# Patient Record
Sex: Female | Born: 1935 | Race: White | Hispanic: No | Smoking: Former smoker
Health system: Southern US, Community
[De-identification: ages and names within clinical notes are randomized; demographics above are authoritative.]

## PROBLEM LIST (undated history)

## (undated) ENCOUNTER — Ambulatory Visit: Admission: EM | Payer: Medicare Other | Source: Home / Self Care

## (undated) DIAGNOSIS — C50919 Malignant neoplasm of unspecified site of unspecified female breast: Secondary | ICD-10-CM

## (undated) DIAGNOSIS — F0390 Unspecified dementia without behavioral disturbance: Secondary | ICD-10-CM

## (undated) DIAGNOSIS — E079 Disorder of thyroid, unspecified: Secondary | ICD-10-CM

## (undated) DIAGNOSIS — N289 Disorder of kidney and ureter, unspecified: Secondary | ICD-10-CM

## (undated) DIAGNOSIS — E119 Type 2 diabetes mellitus without complications: Secondary | ICD-10-CM

## (undated) HISTORY — PX: BREAST SURGERY: SHX581

## (undated) HISTORY — DX: Malignant neoplasm of unspecified site of unspecified female breast: C50.919

## (undated) HISTORY — PX: BOWEL RESECTION: SHX1257

## (undated) HISTORY — PX: SKIN BIOPSY: SHX1

## (undated) HISTORY — PX: OTHER SURGICAL HISTORY: SHX169

---

## 2004-05-24 ENCOUNTER — Ambulatory Visit: Payer: Self-pay | Admitting: Oncology

## 2004-10-05 ENCOUNTER — Ambulatory Visit: Payer: Self-pay | Admitting: Neurology

## 2004-11-22 ENCOUNTER — Other Ambulatory Visit: Payer: Self-pay

## 2004-11-24 ENCOUNTER — Inpatient Hospital Stay: Payer: Self-pay | Admitting: Internal Medicine

## 2004-11-26 ENCOUNTER — Ambulatory Visit: Payer: Self-pay | Admitting: Internal Medicine

## 2005-01-23 ENCOUNTER — Ambulatory Visit: Payer: Self-pay | Admitting: Oncology

## 2005-03-03 ENCOUNTER — Ambulatory Visit: Payer: Self-pay | Admitting: Unknown Physician Specialty

## 2005-03-29 ENCOUNTER — Inpatient Hospital Stay: Payer: Self-pay | Admitting: Unknown Physician Specialty

## 2005-03-29 ENCOUNTER — Other Ambulatory Visit: Payer: Self-pay

## 2005-04-20 ENCOUNTER — Inpatient Hospital Stay: Payer: Self-pay | Admitting: Internal Medicine

## 2005-05-16 ENCOUNTER — Inpatient Hospital Stay: Payer: Self-pay | Admitting: Internal Medicine

## 2005-05-29 ENCOUNTER — Ambulatory Visit: Payer: Self-pay | Admitting: Oncology

## 2005-06-02 ENCOUNTER — Ambulatory Visit: Payer: Self-pay | Admitting: Oncology

## 2005-06-28 ENCOUNTER — Inpatient Hospital Stay: Payer: Self-pay | Admitting: Unknown Physician Specialty

## 2005-06-28 ENCOUNTER — Other Ambulatory Visit: Payer: Self-pay

## 2005-07-19 ENCOUNTER — Ambulatory Visit: Payer: Self-pay | Admitting: Family Medicine

## 2005-07-21 ENCOUNTER — Emergency Department: Payer: Self-pay | Admitting: Emergency Medicine

## 2005-07-23 ENCOUNTER — Other Ambulatory Visit: Payer: Self-pay

## 2005-07-23 ENCOUNTER — Emergency Department: Payer: Self-pay | Admitting: Emergency Medicine

## 2005-08-31 ENCOUNTER — Ambulatory Visit: Payer: Self-pay | Admitting: Obstetrics and Gynecology

## 2005-08-31 ENCOUNTER — Other Ambulatory Visit: Payer: Self-pay

## 2005-09-08 ENCOUNTER — Ambulatory Visit: Payer: Self-pay | Admitting: Obstetrics and Gynecology

## 2005-11-27 ENCOUNTER — Ambulatory Visit: Payer: Self-pay | Admitting: Oncology

## 2005-12-01 ENCOUNTER — Ambulatory Visit: Payer: Self-pay | Admitting: Oncology

## 2005-12-31 ENCOUNTER — Ambulatory Visit: Payer: Self-pay | Admitting: Oncology

## 2006-01-02 ENCOUNTER — Ambulatory Visit: Payer: Self-pay | Admitting: Family Medicine

## 2006-01-12 ENCOUNTER — Ambulatory Visit: Payer: Self-pay | Admitting: Internal Medicine

## 2006-02-06 ENCOUNTER — Ambulatory Visit: Payer: Self-pay | Admitting: Oncology

## 2006-02-08 ENCOUNTER — Ambulatory Visit: Payer: Self-pay | Admitting: Oncology

## 2006-03-20 ENCOUNTER — Inpatient Hospital Stay: Payer: Self-pay | Admitting: Internal Medicine

## 2006-03-20 ENCOUNTER — Other Ambulatory Visit: Payer: Self-pay

## 2006-05-09 ENCOUNTER — Ambulatory Visit: Payer: Self-pay | Admitting: Oncology

## 2006-06-02 ENCOUNTER — Ambulatory Visit: Payer: Self-pay | Admitting: Oncology

## 2006-09-03 ENCOUNTER — Other Ambulatory Visit: Payer: Self-pay

## 2006-09-03 ENCOUNTER — Inpatient Hospital Stay: Payer: Self-pay | Admitting: Internal Medicine

## 2006-09-04 ENCOUNTER — Other Ambulatory Visit: Payer: Self-pay

## 2006-09-05 ENCOUNTER — Other Ambulatory Visit: Payer: Self-pay

## 2006-09-10 ENCOUNTER — Ambulatory Visit: Payer: Self-pay | Admitting: Oncology

## 2006-10-02 ENCOUNTER — Ambulatory Visit: Payer: Self-pay | Admitting: Oncology

## 2007-01-07 ENCOUNTER — Ambulatory Visit: Payer: Self-pay | Admitting: Oncology

## 2007-01-14 ENCOUNTER — Ambulatory Visit: Payer: Self-pay | Admitting: Oncology

## 2007-01-15 ENCOUNTER — Ambulatory Visit: Payer: Self-pay | Admitting: Oncology

## 2007-02-01 ENCOUNTER — Ambulatory Visit: Payer: Self-pay | Admitting: Oncology

## 2007-06-24 ENCOUNTER — Ambulatory Visit: Payer: Self-pay | Admitting: Unknown Physician Specialty

## 2007-07-04 ENCOUNTER — Ambulatory Visit: Payer: Self-pay | Admitting: Oncology

## 2007-07-12 ENCOUNTER — Ambulatory Visit: Payer: Self-pay | Admitting: Oncology

## 2007-08-04 ENCOUNTER — Ambulatory Visit: Payer: Self-pay | Admitting: Oncology

## 2007-08-07 ENCOUNTER — Ambulatory Visit: Payer: Self-pay | Admitting: Internal Medicine

## 2007-09-04 ENCOUNTER — Ambulatory Visit: Payer: Self-pay | Admitting: Family Medicine

## 2007-12-02 ENCOUNTER — Ambulatory Visit: Payer: Self-pay | Admitting: Oncology

## 2007-12-12 ENCOUNTER — Ambulatory Visit: Payer: Self-pay | Admitting: Oncology

## 2007-12-24 ENCOUNTER — Ambulatory Visit: Payer: Self-pay | Admitting: Oncology

## 2007-12-26 ENCOUNTER — Ambulatory Visit: Payer: Self-pay | Admitting: Surgery

## 2008-01-01 ENCOUNTER — Ambulatory Visit: Payer: Self-pay | Admitting: Oncology

## 2008-01-02 ENCOUNTER — Other Ambulatory Visit: Payer: Self-pay

## 2008-01-02 ENCOUNTER — Ambulatory Visit: Payer: Self-pay | Admitting: Surgery

## 2008-01-07 ENCOUNTER — Ambulatory Visit: Payer: Self-pay | Admitting: Oncology

## 2008-01-09 ENCOUNTER — Ambulatory Visit: Payer: Self-pay | Admitting: Surgery

## 2008-02-01 ENCOUNTER — Ambulatory Visit: Payer: Self-pay | Admitting: Oncology

## 2008-03-03 ENCOUNTER — Ambulatory Visit: Payer: Self-pay | Admitting: Oncology

## 2008-04-02 ENCOUNTER — Ambulatory Visit: Payer: Self-pay | Admitting: Oncology

## 2008-05-03 ENCOUNTER — Ambulatory Visit: Payer: Self-pay | Admitting: Oncology

## 2008-06-02 ENCOUNTER — Ambulatory Visit: Payer: Self-pay | Admitting: Oncology

## 2008-06-10 ENCOUNTER — Ambulatory Visit: Payer: Self-pay | Admitting: Internal Medicine

## 2008-07-03 ENCOUNTER — Ambulatory Visit: Payer: Self-pay | Admitting: Oncology

## 2008-08-03 ENCOUNTER — Ambulatory Visit: Payer: Self-pay | Admitting: Oncology

## 2008-08-31 ENCOUNTER — Ambulatory Visit: Payer: Self-pay | Admitting: Oncology

## 2008-10-01 ENCOUNTER — Ambulatory Visit: Payer: Self-pay | Admitting: Oncology

## 2008-10-06 ENCOUNTER — Ambulatory Visit: Payer: Self-pay | Admitting: Oncology

## 2008-10-31 ENCOUNTER — Ambulatory Visit: Payer: Self-pay | Admitting: Oncology

## 2008-12-01 ENCOUNTER — Ambulatory Visit: Payer: Self-pay | Admitting: Oncology

## 2008-12-08 ENCOUNTER — Ambulatory Visit: Payer: Self-pay | Admitting: Oncology

## 2008-12-25 ENCOUNTER — Observation Stay: Payer: Self-pay | Admitting: Specialist

## 2008-12-31 ENCOUNTER — Ambulatory Visit: Payer: Self-pay | Admitting: Oncology

## 2009-01-05 ENCOUNTER — Ambulatory Visit: Payer: Self-pay | Admitting: Oncology

## 2009-01-31 ENCOUNTER — Ambulatory Visit: Payer: Self-pay | Admitting: Oncology

## 2009-02-01 ENCOUNTER — Inpatient Hospital Stay: Payer: Self-pay | Admitting: Internal Medicine

## 2009-02-09 ENCOUNTER — Ambulatory Visit: Payer: Self-pay | Admitting: Oncology

## 2009-02-15 ENCOUNTER — Encounter: Payer: Self-pay | Admitting: Family Medicine

## 2009-03-03 ENCOUNTER — Ambulatory Visit: Payer: Self-pay | Admitting: Oncology

## 2009-03-03 ENCOUNTER — Encounter: Payer: Self-pay | Admitting: Family Medicine

## 2009-04-02 ENCOUNTER — Ambulatory Visit: Payer: Self-pay | Admitting: Oncology

## 2009-05-03 ENCOUNTER — Ambulatory Visit: Payer: Self-pay | Admitting: Oncology

## 2009-05-11 ENCOUNTER — Ambulatory Visit: Payer: Self-pay | Admitting: Oncology

## 2009-06-02 ENCOUNTER — Ambulatory Visit: Payer: Self-pay | Admitting: Oncology

## 2009-07-03 ENCOUNTER — Ambulatory Visit: Payer: Self-pay | Admitting: Oncology

## 2009-07-08 ENCOUNTER — Ambulatory Visit: Payer: Self-pay | Admitting: Nephrology

## 2009-07-09 ENCOUNTER — Ambulatory Visit: Payer: Self-pay | Admitting: Oncology

## 2009-08-03 ENCOUNTER — Ambulatory Visit: Payer: Self-pay | Admitting: Oncology

## 2009-08-31 ENCOUNTER — Ambulatory Visit: Payer: Self-pay | Admitting: Oncology

## 2009-09-21 ENCOUNTER — Ambulatory Visit: Payer: Self-pay | Admitting: Oncology

## 2009-10-01 ENCOUNTER — Ambulatory Visit: Payer: Self-pay | Admitting: Oncology

## 2009-10-31 ENCOUNTER — Ambulatory Visit: Payer: Self-pay | Admitting: Oncology

## 2009-12-01 ENCOUNTER — Ambulatory Visit: Payer: Self-pay | Admitting: Oncology

## 2009-12-11 ENCOUNTER — Inpatient Hospital Stay: Payer: Self-pay | Admitting: *Deleted

## 2009-12-21 ENCOUNTER — Ambulatory Visit: Payer: Self-pay | Admitting: Oncology

## 2009-12-26 ENCOUNTER — Inpatient Hospital Stay: Payer: Self-pay | Admitting: Internal Medicine

## 2009-12-31 ENCOUNTER — Ambulatory Visit: Payer: Self-pay | Admitting: Oncology

## 2010-01-31 ENCOUNTER — Ambulatory Visit: Payer: Self-pay | Admitting: Oncology

## 2010-03-03 ENCOUNTER — Ambulatory Visit: Payer: Self-pay | Admitting: Oncology

## 2010-04-02 ENCOUNTER — Ambulatory Visit: Payer: Self-pay | Admitting: Oncology

## 2010-05-03 ENCOUNTER — Ambulatory Visit: Payer: Self-pay | Admitting: Oncology

## 2010-06-02 ENCOUNTER — Ambulatory Visit: Payer: Self-pay | Admitting: Oncology

## 2010-07-03 ENCOUNTER — Ambulatory Visit: Payer: Self-pay | Admitting: Oncology

## 2010-08-03 ENCOUNTER — Ambulatory Visit: Payer: Self-pay | Admitting: Oncology

## 2010-09-01 ENCOUNTER — Ambulatory Visit: Payer: Self-pay | Admitting: Oncology

## 2010-09-22 LAB — CANCER ANTIGEN 27.29: CA 27.29: 34.2 U/mL (ref 0.0–38.6)

## 2010-10-02 ENCOUNTER — Ambulatory Visit: Payer: Self-pay | Admitting: Oncology

## 2010-11-01 ENCOUNTER — Ambulatory Visit: Payer: Self-pay | Admitting: Oncology

## 2010-11-15 ENCOUNTER — Observation Stay: Payer: Self-pay | Admitting: Internal Medicine

## 2010-11-23 ENCOUNTER — Ambulatory Visit: Payer: Self-pay | Admitting: Oncology

## 2010-11-24 LAB — CANCER ANTIGEN 27.29: CA 27.29: 38.6 U/mL (ref 0.0–38.6)

## 2010-12-02 ENCOUNTER — Ambulatory Visit: Payer: Self-pay | Admitting: Oncology

## 2011-01-01 ENCOUNTER — Ambulatory Visit: Payer: Self-pay | Admitting: Oncology

## 2011-02-01 ENCOUNTER — Ambulatory Visit: Payer: Self-pay | Admitting: Oncology

## 2011-03-04 ENCOUNTER — Ambulatory Visit: Payer: Self-pay | Admitting: Oncology

## 2011-04-05 ENCOUNTER — Ambulatory Visit: Payer: Self-pay | Admitting: Oncology

## 2011-04-06 LAB — CANCER ANTIGEN 27.29: CA 27.29: 26.1 U/mL (ref 0.0–38.6)

## 2011-05-04 ENCOUNTER — Ambulatory Visit: Payer: Self-pay | Admitting: Oncology

## 2011-06-03 ENCOUNTER — Ambulatory Visit: Payer: Self-pay | Admitting: Oncology

## 2011-07-04 ENCOUNTER — Ambulatory Visit: Payer: Self-pay | Admitting: Oncology

## 2011-07-04 DIAGNOSIS — Z9010 Acquired absence of unspecified breast and nipple: Secondary | ICD-10-CM | POA: Diagnosis not present

## 2011-07-04 DIAGNOSIS — N189 Chronic kidney disease, unspecified: Secondary | ICD-10-CM | POA: Diagnosis not present

## 2011-07-04 DIAGNOSIS — M899 Disorder of bone, unspecified: Secondary | ICD-10-CM | POA: Diagnosis not present

## 2011-07-04 DIAGNOSIS — I129 Hypertensive chronic kidney disease with stage 1 through stage 4 chronic kidney disease, or unspecified chronic kidney disease: Secondary | ICD-10-CM | POA: Diagnosis not present

## 2011-07-04 DIAGNOSIS — Z95818 Presence of other cardiac implants and grafts: Secondary | ICD-10-CM | POA: Diagnosis not present

## 2011-07-04 DIAGNOSIS — I2510 Atherosclerotic heart disease of native coronary artery without angina pectoris: Secondary | ICD-10-CM | POA: Diagnosis not present

## 2011-07-04 DIAGNOSIS — Z5181 Encounter for therapeutic drug level monitoring: Secondary | ICD-10-CM | POA: Diagnosis not present

## 2011-07-04 DIAGNOSIS — M129 Arthropathy, unspecified: Secondary | ICD-10-CM | POA: Diagnosis not present

## 2011-07-04 DIAGNOSIS — Z7982 Long term (current) use of aspirin: Secondary | ICD-10-CM | POA: Diagnosis not present

## 2011-07-04 DIAGNOSIS — D638 Anemia in other chronic diseases classified elsewhere: Secondary | ICD-10-CM | POA: Diagnosis not present

## 2011-07-04 DIAGNOSIS — Z79899 Other long term (current) drug therapy: Secondary | ICD-10-CM | POA: Diagnosis not present

## 2011-07-04 DIAGNOSIS — E119 Type 2 diabetes mellitus without complications: Secondary | ICD-10-CM | POA: Diagnosis not present

## 2011-07-04 DIAGNOSIS — E039 Hypothyroidism, unspecified: Secondary | ICD-10-CM | POA: Diagnosis not present

## 2011-07-04 DIAGNOSIS — C50919 Malignant neoplasm of unspecified site of unspecified female breast: Secondary | ICD-10-CM | POA: Diagnosis not present

## 2011-07-04 DIAGNOSIS — Z7902 Long term (current) use of antithrombotics/antiplatelets: Secondary | ICD-10-CM | POA: Diagnosis not present

## 2011-07-04 DIAGNOSIS — Z79811 Long term (current) use of aromatase inhibitors: Secondary | ICD-10-CM | POA: Diagnosis not present

## 2011-07-04 DIAGNOSIS — Z170 Estrogen receptor positive status [ER+]: Secondary | ICD-10-CM | POA: Diagnosis not present

## 2011-07-18 DIAGNOSIS — J40 Bronchitis, not specified as acute or chronic: Secondary | ICD-10-CM | POA: Diagnosis not present

## 2011-07-24 LAB — CANCER CENTER HEMOGLOBIN: HGB: 10.1 g/dL — ABNORMAL LOW (ref 12.0–16.0)

## 2011-08-04 ENCOUNTER — Ambulatory Visit: Payer: Self-pay | Admitting: Oncology

## 2011-08-04 DIAGNOSIS — C50919 Malignant neoplasm of unspecified site of unspecified female breast: Secondary | ICD-10-CM | POA: Diagnosis not present

## 2011-08-04 DIAGNOSIS — Z79899 Other long term (current) drug therapy: Secondary | ICD-10-CM | POA: Diagnosis not present

## 2011-08-04 DIAGNOSIS — D638 Anemia in other chronic diseases classified elsewhere: Secondary | ICD-10-CM | POA: Diagnosis not present

## 2011-08-04 DIAGNOSIS — Z7902 Long term (current) use of antithrombotics/antiplatelets: Secondary | ICD-10-CM | POA: Diagnosis not present

## 2011-08-04 DIAGNOSIS — I2510 Atherosclerotic heart disease of native coronary artery without angina pectoris: Secondary | ICD-10-CM | POA: Diagnosis not present

## 2011-08-04 DIAGNOSIS — I129 Hypertensive chronic kidney disease with stage 1 through stage 4 chronic kidney disease, or unspecified chronic kidney disease: Secondary | ICD-10-CM | POA: Diagnosis not present

## 2011-08-04 DIAGNOSIS — M949 Disorder of cartilage, unspecified: Secondary | ICD-10-CM | POA: Diagnosis not present

## 2011-08-04 DIAGNOSIS — Z79811 Long term (current) use of aromatase inhibitors: Secondary | ICD-10-CM | POA: Diagnosis not present

## 2011-08-04 DIAGNOSIS — Z5181 Encounter for therapeutic drug level monitoring: Secondary | ICD-10-CM | POA: Diagnosis not present

## 2011-08-04 DIAGNOSIS — E119 Type 2 diabetes mellitus without complications: Secondary | ICD-10-CM | POA: Diagnosis not present

## 2011-08-04 DIAGNOSIS — Z170 Estrogen receptor positive status [ER+]: Secondary | ICD-10-CM | POA: Diagnosis not present

## 2011-08-04 DIAGNOSIS — E039 Hypothyroidism, unspecified: Secondary | ICD-10-CM | POA: Diagnosis not present

## 2011-08-04 DIAGNOSIS — N189 Chronic kidney disease, unspecified: Secondary | ICD-10-CM | POA: Diagnosis not present

## 2011-08-04 DIAGNOSIS — Z9010 Acquired absence of unspecified breast and nipple: Secondary | ICD-10-CM | POA: Diagnosis not present

## 2011-08-04 DIAGNOSIS — Z7982 Long term (current) use of aspirin: Secondary | ICD-10-CM | POA: Diagnosis not present

## 2011-08-04 DIAGNOSIS — Z95818 Presence of other cardiac implants and grafts: Secondary | ICD-10-CM | POA: Diagnosis not present

## 2011-08-04 DIAGNOSIS — M899 Disorder of bone, unspecified: Secondary | ICD-10-CM | POA: Diagnosis not present

## 2011-08-04 DIAGNOSIS — M129 Arthropathy, unspecified: Secondary | ICD-10-CM | POA: Diagnosis not present

## 2011-08-04 LAB — CANCER CENTER HEMOGLOBIN: HGB: 9.7 g/dL — ABNORMAL LOW (ref 12.0–16.0)

## 2011-08-18 DIAGNOSIS — Z79811 Long term (current) use of aromatase inhibitors: Secondary | ICD-10-CM | POA: Diagnosis not present

## 2011-08-18 DIAGNOSIS — M949 Disorder of cartilage, unspecified: Secondary | ICD-10-CM | POA: Diagnosis not present

## 2011-08-18 DIAGNOSIS — C50919 Malignant neoplasm of unspecified site of unspecified female breast: Secondary | ICD-10-CM | POA: Diagnosis not present

## 2011-08-18 DIAGNOSIS — Z170 Estrogen receptor positive status [ER+]: Secondary | ICD-10-CM | POA: Diagnosis not present

## 2011-08-18 DIAGNOSIS — N189 Chronic kidney disease, unspecified: Secondary | ICD-10-CM | POA: Diagnosis not present

## 2011-08-18 DIAGNOSIS — D638 Anemia in other chronic diseases classified elsewhere: Secondary | ICD-10-CM | POA: Diagnosis not present

## 2011-08-18 LAB — CANCER CENTER HEMOGLOBIN: HGB: 9.9 g/dL — ABNORMAL LOW (ref 12.0–16.0)

## 2011-08-27 ENCOUNTER — Emergency Department: Payer: Self-pay | Admitting: Emergency Medicine

## 2011-08-27 DIAGNOSIS — N189 Chronic kidney disease, unspecified: Secondary | ICD-10-CM | POA: Diagnosis not present

## 2011-08-27 DIAGNOSIS — Z86718 Personal history of other venous thrombosis and embolism: Secondary | ICD-10-CM | POA: Diagnosis not present

## 2011-08-27 DIAGNOSIS — Z0389 Encounter for observation for other suspected diseases and conditions ruled out: Secondary | ICD-10-CM | POA: Diagnosis not present

## 2011-08-27 DIAGNOSIS — M7120 Synovial cyst of popliteal space [Baker], unspecified knee: Secondary | ICD-10-CM | POA: Diagnosis not present

## 2011-09-01 ENCOUNTER — Ambulatory Visit: Payer: Self-pay | Admitting: Oncology

## 2011-09-01 DIAGNOSIS — N189 Chronic kidney disease, unspecified: Secondary | ICD-10-CM | POA: Diagnosis not present

## 2011-09-01 DIAGNOSIS — Z79899 Other long term (current) drug therapy: Secondary | ICD-10-CM | POA: Diagnosis not present

## 2011-09-01 DIAGNOSIS — Z7902 Long term (current) use of antithrombotics/antiplatelets: Secondary | ICD-10-CM | POA: Diagnosis not present

## 2011-09-01 DIAGNOSIS — Z79811 Long term (current) use of aromatase inhibitors: Secondary | ICD-10-CM | POA: Diagnosis not present

## 2011-09-01 DIAGNOSIS — C50919 Malignant neoplasm of unspecified site of unspecified female breast: Secondary | ICD-10-CM | POA: Diagnosis not present

## 2011-09-01 DIAGNOSIS — D638 Anemia in other chronic diseases classified elsewhere: Secondary | ICD-10-CM | POA: Diagnosis not present

## 2011-09-01 DIAGNOSIS — I2510 Atherosclerotic heart disease of native coronary artery without angina pectoris: Secondary | ICD-10-CM | POA: Diagnosis not present

## 2011-09-01 DIAGNOSIS — M129 Arthropathy, unspecified: Secondary | ICD-10-CM | POA: Diagnosis not present

## 2011-09-01 DIAGNOSIS — Z5181 Encounter for therapeutic drug level monitoring: Secondary | ICD-10-CM | POA: Diagnosis not present

## 2011-09-01 DIAGNOSIS — E039 Hypothyroidism, unspecified: Secondary | ICD-10-CM | POA: Diagnosis not present

## 2011-09-01 DIAGNOSIS — Z9010 Acquired absence of unspecified breast and nipple: Secondary | ICD-10-CM | POA: Diagnosis not present

## 2011-09-01 DIAGNOSIS — Z95818 Presence of other cardiac implants and grafts: Secondary | ICD-10-CM | POA: Diagnosis not present

## 2011-09-01 DIAGNOSIS — Z7982 Long term (current) use of aspirin: Secondary | ICD-10-CM | POA: Diagnosis not present

## 2011-09-01 DIAGNOSIS — I129 Hypertensive chronic kidney disease with stage 1 through stage 4 chronic kidney disease, or unspecified chronic kidney disease: Secondary | ICD-10-CM | POA: Diagnosis not present

## 2011-09-01 DIAGNOSIS — Z170 Estrogen receptor positive status [ER+]: Secondary | ICD-10-CM | POA: Diagnosis not present

## 2011-09-01 DIAGNOSIS — M899 Disorder of bone, unspecified: Secondary | ICD-10-CM | POA: Diagnosis not present

## 2011-09-01 DIAGNOSIS — E119 Type 2 diabetes mellitus without complications: Secondary | ICD-10-CM | POA: Diagnosis not present

## 2011-09-08 DIAGNOSIS — D638 Anemia in other chronic diseases classified elsewhere: Secondary | ICD-10-CM | POA: Diagnosis not present

## 2011-09-08 DIAGNOSIS — C50919 Malignant neoplasm of unspecified site of unspecified female breast: Secondary | ICD-10-CM | POA: Diagnosis not present

## 2011-09-08 DIAGNOSIS — Z79811 Long term (current) use of aromatase inhibitors: Secondary | ICD-10-CM | POA: Diagnosis not present

## 2011-09-08 DIAGNOSIS — N189 Chronic kidney disease, unspecified: Secondary | ICD-10-CM | POA: Diagnosis not present

## 2011-09-08 LAB — CBC CANCER CENTER
Basophil #: 0.1 x10 3/mm (ref 0.0–0.1)
Basophil %: 0.8 %
Eosinophil #: 0.1 x10 3/mm (ref 0.0–0.7)
Eosinophil %: 1.8 %
HCT: 33.7 % — ABNORMAL LOW (ref 35.0–47.0)
HGB: 10.8 g/dL — ABNORMAL LOW (ref 12.0–16.0)
Lymphocyte #: 2 x10 3/mm (ref 1.0–3.6)
Lymphocyte %: 30.1 %
MCHC: 32.1 g/dL (ref 32.0–36.0)
MCV: 92.8 fL (ref 80–100)
Monocyte #: 0.5 x10 3/mm (ref 0.0–0.7)
Monocyte %: 8.1 %
Neutrophil #: 4 x10 3/mm (ref 1.4–6.5)

## 2011-09-08 LAB — COMPREHENSIVE METABOLIC PANEL
Anion Gap: 5 — ABNORMAL LOW (ref 7–16)
Calcium, Total: 9.6 mg/dL (ref 8.5–10.1)
Co2: 31 mmol/L (ref 21–32)
Creatinine: 2.12 mg/dL — ABNORMAL HIGH (ref 0.60–1.30)
EGFR (Non-African Amer.): 23 — ABNORMAL LOW
Glucose: 96 mg/dL (ref 65–99)
Osmolality: 284 (ref 275–301)
Potassium: 4.5 mmol/L (ref 3.5–5.1)
SGOT(AST): 22 U/L (ref 15–37)
SGPT (ALT): 20 U/L
Sodium: 139 mmol/L (ref 136–145)
Total Protein: 7.2 g/dL (ref 6.4–8.2)

## 2011-09-11 DIAGNOSIS — I2510 Atherosclerotic heart disease of native coronary artery without angina pectoris: Secondary | ICD-10-CM | POA: Diagnosis not present

## 2011-09-11 DIAGNOSIS — E782 Mixed hyperlipidemia: Secondary | ICD-10-CM | POA: Diagnosis not present

## 2011-09-11 DIAGNOSIS — I119 Hypertensive heart disease without heart failure: Secondary | ICD-10-CM | POA: Diagnosis not present

## 2011-09-11 DIAGNOSIS — I209 Angina pectoris, unspecified: Secondary | ICD-10-CM | POA: Diagnosis not present

## 2011-09-14 DIAGNOSIS — N184 Chronic kidney disease, stage 4 (severe): Secondary | ICD-10-CM | POA: Diagnosis not present

## 2011-09-14 DIAGNOSIS — D631 Anemia in chronic kidney disease: Secondary | ICD-10-CM | POA: Diagnosis not present

## 2011-09-14 DIAGNOSIS — I701 Atherosclerosis of renal artery: Secondary | ICD-10-CM | POA: Diagnosis not present

## 2011-09-14 DIAGNOSIS — I10 Essential (primary) hypertension: Secondary | ICD-10-CM | POA: Diagnosis not present

## 2011-09-15 DIAGNOSIS — I209 Angina pectoris, unspecified: Secondary | ICD-10-CM | POA: Diagnosis not present

## 2011-09-18 DIAGNOSIS — J019 Acute sinusitis, unspecified: Secondary | ICD-10-CM | POA: Diagnosis not present

## 2011-09-20 DIAGNOSIS — I209 Angina pectoris, unspecified: Secondary | ICD-10-CM | POA: Diagnosis not present

## 2011-09-20 DIAGNOSIS — I252 Old myocardial infarction: Secondary | ICD-10-CM | POA: Diagnosis not present

## 2011-09-20 DIAGNOSIS — I119 Hypertensive heart disease without heart failure: Secondary | ICD-10-CM | POA: Diagnosis not present

## 2011-09-20 DIAGNOSIS — I2510 Atherosclerotic heart disease of native coronary artery without angina pectoris: Secondary | ICD-10-CM | POA: Diagnosis not present

## 2011-09-22 LAB — IRON AND TIBC
Iron Bind.Cap.(Total): 312 ug/dL (ref 250–450)
Iron Saturation: 15 %

## 2011-09-28 DIAGNOSIS — Z961 Presence of intraocular lens: Secondary | ICD-10-CM | POA: Diagnosis not present

## 2011-10-02 ENCOUNTER — Ambulatory Visit: Payer: Self-pay | Admitting: Oncology

## 2011-10-02 DIAGNOSIS — Z7902 Long term (current) use of antithrombotics/antiplatelets: Secondary | ICD-10-CM | POA: Diagnosis not present

## 2011-10-02 DIAGNOSIS — Z7982 Long term (current) use of aspirin: Secondary | ICD-10-CM | POA: Diagnosis not present

## 2011-10-02 DIAGNOSIS — M899 Disorder of bone, unspecified: Secondary | ICD-10-CM | POA: Diagnosis not present

## 2011-10-02 DIAGNOSIS — Z79899 Other long term (current) drug therapy: Secondary | ICD-10-CM | POA: Diagnosis not present

## 2011-10-02 DIAGNOSIS — Z5181 Encounter for therapeutic drug level monitoring: Secondary | ICD-10-CM | POA: Diagnosis not present

## 2011-10-02 DIAGNOSIS — Z95818 Presence of other cardiac implants and grafts: Secondary | ICD-10-CM | POA: Diagnosis not present

## 2011-10-02 DIAGNOSIS — C50919 Malignant neoplasm of unspecified site of unspecified female breast: Secondary | ICD-10-CM | POA: Diagnosis not present

## 2011-10-02 DIAGNOSIS — I2510 Atherosclerotic heart disease of native coronary artery without angina pectoris: Secondary | ICD-10-CM | POA: Diagnosis not present

## 2011-10-02 DIAGNOSIS — Z79811 Long term (current) use of aromatase inhibitors: Secondary | ICD-10-CM | POA: Diagnosis not present

## 2011-10-02 DIAGNOSIS — E039 Hypothyroidism, unspecified: Secondary | ICD-10-CM | POA: Diagnosis not present

## 2011-10-02 DIAGNOSIS — D638 Anemia in other chronic diseases classified elsewhere: Secondary | ICD-10-CM | POA: Diagnosis not present

## 2011-10-02 DIAGNOSIS — I129 Hypertensive chronic kidney disease with stage 1 through stage 4 chronic kidney disease, or unspecified chronic kidney disease: Secondary | ICD-10-CM | POA: Diagnosis not present

## 2011-10-02 DIAGNOSIS — M129 Arthropathy, unspecified: Secondary | ICD-10-CM | POA: Diagnosis not present

## 2011-10-02 DIAGNOSIS — Z170 Estrogen receptor positive status [ER+]: Secondary | ICD-10-CM | POA: Diagnosis not present

## 2011-10-02 DIAGNOSIS — N189 Chronic kidney disease, unspecified: Secondary | ICD-10-CM | POA: Diagnosis not present

## 2011-10-02 DIAGNOSIS — Z9010 Acquired absence of unspecified breast and nipple: Secondary | ICD-10-CM | POA: Diagnosis not present

## 2011-10-02 DIAGNOSIS — E119 Type 2 diabetes mellitus without complications: Secondary | ICD-10-CM | POA: Diagnosis not present

## 2011-10-18 DIAGNOSIS — E785 Hyperlipidemia, unspecified: Secondary | ICD-10-CM | POA: Diagnosis not present

## 2011-10-18 DIAGNOSIS — I10 Essential (primary) hypertension: Secondary | ICD-10-CM | POA: Diagnosis not present

## 2011-10-18 DIAGNOSIS — I2510 Atherosclerotic heart disease of native coronary artery without angina pectoris: Secondary | ICD-10-CM | POA: Diagnosis not present

## 2011-10-20 LAB — CANCER CENTER HEMOGLOBIN: HGB: 11.5 g/dL — ABNORMAL LOW (ref 12.0–16.0)

## 2011-10-30 DIAGNOSIS — M6740 Ganglion, unspecified site: Secondary | ICD-10-CM | POA: Diagnosis not present

## 2011-10-30 DIAGNOSIS — M1710 Unilateral primary osteoarthritis, unspecified knee: Secondary | ICD-10-CM | POA: Diagnosis not present

## 2011-11-01 ENCOUNTER — Ambulatory Visit: Payer: Self-pay | Admitting: Oncology

## 2011-11-01 DIAGNOSIS — Z170 Estrogen receptor positive status [ER+]: Secondary | ICD-10-CM | POA: Diagnosis not present

## 2011-11-01 DIAGNOSIS — D638 Anemia in other chronic diseases classified elsewhere: Secondary | ICD-10-CM | POA: Diagnosis not present

## 2011-11-01 DIAGNOSIS — Z79899 Other long term (current) drug therapy: Secondary | ICD-10-CM | POA: Diagnosis not present

## 2011-11-01 DIAGNOSIS — I129 Hypertensive chronic kidney disease with stage 1 through stage 4 chronic kidney disease, or unspecified chronic kidney disease: Secondary | ICD-10-CM | POA: Diagnosis not present

## 2011-11-01 DIAGNOSIS — I2510 Atherosclerotic heart disease of native coronary artery without angina pectoris: Secondary | ICD-10-CM | POA: Diagnosis not present

## 2011-11-01 DIAGNOSIS — Z5181 Encounter for therapeutic drug level monitoring: Secondary | ICD-10-CM | POA: Diagnosis not present

## 2011-11-01 DIAGNOSIS — N189 Chronic kidney disease, unspecified: Secondary | ICD-10-CM | POA: Diagnosis not present

## 2011-11-01 DIAGNOSIS — C50919 Malignant neoplasm of unspecified site of unspecified female breast: Secondary | ICD-10-CM | POA: Diagnosis not present

## 2011-11-01 DIAGNOSIS — Z7982 Long term (current) use of aspirin: Secondary | ICD-10-CM | POA: Diagnosis not present

## 2011-11-01 DIAGNOSIS — E039 Hypothyroidism, unspecified: Secondary | ICD-10-CM | POA: Diagnosis not present

## 2011-11-01 DIAGNOSIS — Z95818 Presence of other cardiac implants and grafts: Secondary | ICD-10-CM | POA: Diagnosis not present

## 2011-11-01 DIAGNOSIS — Z7902 Long term (current) use of antithrombotics/antiplatelets: Secondary | ICD-10-CM | POA: Diagnosis not present

## 2011-11-01 DIAGNOSIS — E119 Type 2 diabetes mellitus without complications: Secondary | ICD-10-CM | POA: Diagnosis not present

## 2011-11-01 DIAGNOSIS — M129 Arthropathy, unspecified: Secondary | ICD-10-CM | POA: Diagnosis not present

## 2011-11-01 DIAGNOSIS — Z79811 Long term (current) use of aromatase inhibitors: Secondary | ICD-10-CM | POA: Diagnosis not present

## 2011-11-01 DIAGNOSIS — M899 Disorder of bone, unspecified: Secondary | ICD-10-CM | POA: Diagnosis not present

## 2011-11-01 DIAGNOSIS — Z9010 Acquired absence of unspecified breast and nipple: Secondary | ICD-10-CM | POA: Diagnosis not present

## 2011-11-03 LAB — CANCER CENTER HEMOGLOBIN: HGB: 11.1 g/dL — ABNORMAL LOW (ref 12.0–16.0)

## 2011-11-16 DIAGNOSIS — D0470 Carcinoma in situ of skin of unspecified lower limb, including hip: Secondary | ICD-10-CM | POA: Diagnosis not present

## 2011-11-16 DIAGNOSIS — Z85828 Personal history of other malignant neoplasm of skin: Secondary | ICD-10-CM | POA: Diagnosis not present

## 2011-11-16 DIAGNOSIS — L819 Disorder of pigmentation, unspecified: Secondary | ICD-10-CM | POA: Diagnosis not present

## 2011-11-16 DIAGNOSIS — D485 Neoplasm of uncertain behavior of skin: Secondary | ICD-10-CM | POA: Diagnosis not present

## 2011-11-16 DIAGNOSIS — L578 Other skin changes due to chronic exposure to nonionizing radiation: Secondary | ICD-10-CM | POA: Diagnosis not present

## 2011-11-17 LAB — CANCER CENTER HEMOGLOBIN: HGB: 10.5 g/dL — ABNORMAL LOW (ref 12.0–16.0)

## 2011-11-27 ENCOUNTER — Emergency Department: Payer: Self-pay | Admitting: Emergency Medicine

## 2011-11-27 DIAGNOSIS — R079 Chest pain, unspecified: Secondary | ICD-10-CM | POA: Diagnosis not present

## 2011-11-27 DIAGNOSIS — R52 Pain, unspecified: Secondary | ICD-10-CM | POA: Diagnosis not present

## 2011-11-27 DIAGNOSIS — I2510 Atherosclerotic heart disease of native coronary artery without angina pectoris: Secondary | ICD-10-CM | POA: Diagnosis not present

## 2011-11-27 DIAGNOSIS — I10 Essential (primary) hypertension: Secondary | ICD-10-CM | POA: Diagnosis not present

## 2011-11-27 DIAGNOSIS — Z95818 Presence of other cardiac implants and grafts: Secondary | ICD-10-CM | POA: Diagnosis not present

## 2011-11-27 DIAGNOSIS — Z79899 Other long term (current) drug therapy: Secondary | ICD-10-CM | POA: Diagnosis not present

## 2011-11-27 DIAGNOSIS — Z7982 Long term (current) use of aspirin: Secondary | ICD-10-CM | POA: Diagnosis not present

## 2011-11-27 DIAGNOSIS — R0789 Other chest pain: Secondary | ICD-10-CM | POA: Diagnosis not present

## 2011-11-27 LAB — URINALYSIS, COMPLETE
Blood: NEGATIVE
Nitrite: NEGATIVE
Ph: 7 (ref 4.5–8.0)
Protein: 30
RBC,UR: <1 /HPF (ref 0–5)
Specific Gravity: 1.005 (ref 1.003–1.030)
Squamous Epithelial: 1
WBC UR: 21 /HPF (ref 0–5)

## 2011-11-27 LAB — COMPREHENSIVE METABOLIC PANEL
Albumin: 3.9 g/dL (ref 3.4–5.0)
Bilirubin,Total: 0.5 mg/dL (ref 0.2–1.0)
Calcium, Total: 9.2 mg/dL (ref 8.5–10.1)
Chloride: 106 mmol/L (ref 98–107)
Creatinine: 1.63 mg/dL — ABNORMAL HIGH (ref 0.60–1.30)
EGFR (Non-African Amer.): 28 — ABNORMAL LOW
Osmolality: 286 (ref 275–301)
SGOT(AST): 24 U/L (ref 15–37)
Sodium: 140 mmol/L (ref 136–145)
Total Protein: 7.1 g/dL (ref 6.4–8.2)

## 2011-11-27 LAB — CBC
HCT: 34.5 % — ABNORMAL LOW (ref 35.0–47.0)
MCH: 29.4 pg (ref 26.0–34.0)
MCHC: 32.2 g/dL (ref 32.0–36.0)
MCV: 91 fL (ref 80–100)
Platelet: 201 10*3/uL (ref 150–440)
RDW: 16.9 % — ABNORMAL HIGH (ref 11.5–14.5)
WBC: 6.1 10*3/uL (ref 3.6–11.0)

## 2011-11-27 LAB — CK TOTAL AND CKMB (NOT AT ARMC)
CK, Total: 64 U/L (ref 21–215)
CK-MB: 1.9 ng/mL (ref 0.5–3.6)

## 2011-11-27 LAB — TROPONIN I: Troponin-I: <0.02 ng/mL

## 2011-12-01 DIAGNOSIS — Z79811 Long term (current) use of aromatase inhibitors: Secondary | ICD-10-CM | POA: Diagnosis not present

## 2011-12-01 DIAGNOSIS — C50919 Malignant neoplasm of unspecified site of unspecified female breast: Secondary | ICD-10-CM | POA: Diagnosis not present

## 2011-12-01 DIAGNOSIS — D638 Anemia in other chronic diseases classified elsewhere: Secondary | ICD-10-CM | POA: Diagnosis not present

## 2011-12-01 DIAGNOSIS — Z170 Estrogen receptor positive status [ER+]: Secondary | ICD-10-CM | POA: Diagnosis not present

## 2011-12-01 LAB — BASIC METABOLIC PANEL
Anion Gap: 7 (ref 7–16)
BUN: 37 mg/dL — ABNORMAL HIGH (ref 7–18)
Calcium, Total: 9.5 mg/dL (ref 8.5–10.1)
Chloride: 106 mmol/L (ref 98–107)
EGFR (African American): 22 — ABNORMAL LOW
Potassium: 5 mmol/L (ref 3.5–5.1)
Sodium: 140 mmol/L (ref 136–145)

## 2011-12-01 LAB — CBC CANCER CENTER
Basophil #: 0.1 x10 3/mm (ref 0.0–0.1)
Eosinophil %: 2 %
HCT: 33.6 % — ABNORMAL LOW (ref 35.0–47.0)
HGB: 10.7 g/dL — ABNORMAL LOW (ref 12.0–16.0)
Lymphocyte #: 1.8 x10 3/mm (ref 1.0–3.6)
Lymphocyte %: 27 %
MCHC: 31.9 g/dL — ABNORMAL LOW (ref 32.0–36.0)
Monocyte #: 0.7 x10 3/mm (ref 0.2–0.9)
Neutrophil #: 4 x10 3/mm (ref 1.4–6.5)
Neutrophil %: 59.4 %
Platelet: 195 x10 3/mm (ref 150–440)
RBC: 3.62 10*6/uL — ABNORMAL LOW (ref 3.80–5.20)
WBC: 6.7 x10 3/mm (ref 3.6–11.0)

## 2011-12-01 LAB — MAGNESIUM: Magnesium: 2.1 mg/dL

## 2011-12-02 ENCOUNTER — Ambulatory Visit: Payer: Self-pay | Admitting: Oncology

## 2011-12-02 DIAGNOSIS — E119 Type 2 diabetes mellitus without complications: Secondary | ICD-10-CM | POA: Diagnosis not present

## 2011-12-02 DIAGNOSIS — C50919 Malignant neoplasm of unspecified site of unspecified female breast: Secondary | ICD-10-CM | POA: Diagnosis not present

## 2011-12-02 DIAGNOSIS — Z95818 Presence of other cardiac implants and grafts: Secondary | ICD-10-CM | POA: Diagnosis not present

## 2011-12-02 DIAGNOSIS — I129 Hypertensive chronic kidney disease with stage 1 through stage 4 chronic kidney disease, or unspecified chronic kidney disease: Secondary | ICD-10-CM | POA: Diagnosis not present

## 2011-12-02 DIAGNOSIS — N189 Chronic kidney disease, unspecified: Secondary | ICD-10-CM | POA: Diagnosis not present

## 2011-12-02 DIAGNOSIS — M129 Arthropathy, unspecified: Secondary | ICD-10-CM | POA: Diagnosis not present

## 2011-12-02 DIAGNOSIS — Z5181 Encounter for therapeutic drug level monitoring: Secondary | ICD-10-CM | POA: Diagnosis not present

## 2011-12-02 DIAGNOSIS — Z79899 Other long term (current) drug therapy: Secondary | ICD-10-CM | POA: Diagnosis not present

## 2011-12-02 DIAGNOSIS — I2510 Atherosclerotic heart disease of native coronary artery without angina pectoris: Secondary | ICD-10-CM | POA: Diagnosis not present

## 2011-12-02 DIAGNOSIS — D638 Anemia in other chronic diseases classified elsewhere: Secondary | ICD-10-CM | POA: Diagnosis not present

## 2011-12-02 DIAGNOSIS — E039 Hypothyroidism, unspecified: Secondary | ICD-10-CM | POA: Diagnosis not present

## 2011-12-02 DIAGNOSIS — Z7902 Long term (current) use of antithrombotics/antiplatelets: Secondary | ICD-10-CM | POA: Diagnosis not present

## 2011-12-02 DIAGNOSIS — M899 Disorder of bone, unspecified: Secondary | ICD-10-CM | POA: Diagnosis not present

## 2011-12-02 DIAGNOSIS — Z7982 Long term (current) use of aspirin: Secondary | ICD-10-CM | POA: Diagnosis not present

## 2011-12-02 DIAGNOSIS — Z79811 Long term (current) use of aromatase inhibitors: Secondary | ICD-10-CM | POA: Diagnosis not present

## 2011-12-02 DIAGNOSIS — Z170 Estrogen receptor positive status [ER+]: Secondary | ICD-10-CM | POA: Diagnosis not present

## 2011-12-02 DIAGNOSIS — Z9010 Acquired absence of unspecified breast and nipple: Secondary | ICD-10-CM | POA: Diagnosis not present

## 2011-12-13 DIAGNOSIS — D0470 Carcinoma in situ of skin of unspecified lower limb, including hip: Secondary | ICD-10-CM | POA: Diagnosis not present

## 2011-12-14 DIAGNOSIS — I701 Atherosclerosis of renal artery: Secondary | ICD-10-CM | POA: Diagnosis not present

## 2011-12-15 LAB — CANCER CENTER HEMOGLOBIN: HGB: 10.9 g/dL — ABNORMAL LOW (ref 12.0–16.0)

## 2011-12-28 DIAGNOSIS — I701 Atherosclerosis of renal artery: Secondary | ICD-10-CM | POA: Diagnosis not present

## 2011-12-28 DIAGNOSIS — D631 Anemia in chronic kidney disease: Secondary | ICD-10-CM | POA: Diagnosis not present

## 2011-12-28 DIAGNOSIS — N184 Chronic kidney disease, stage 4 (severe): Secondary | ICD-10-CM | POA: Diagnosis not present

## 2011-12-28 DIAGNOSIS — N039 Chronic nephritic syndrome with unspecified morphologic changes: Secondary | ICD-10-CM | POA: Diagnosis not present

## 2011-12-28 DIAGNOSIS — I10 Essential (primary) hypertension: Secondary | ICD-10-CM | POA: Diagnosis not present

## 2011-12-29 LAB — CANCER CENTER HEMOGLOBIN: HGB: 11.6 g/dL — ABNORMAL LOW (ref 12.0–16.0)

## 2012-01-01 ENCOUNTER — Ambulatory Visit: Payer: Self-pay | Admitting: Oncology

## 2012-01-01 DIAGNOSIS — Z170 Estrogen receptor positive status [ER+]: Secondary | ICD-10-CM | POA: Diagnosis not present

## 2012-01-01 DIAGNOSIS — I129 Hypertensive chronic kidney disease with stage 1 through stage 4 chronic kidney disease, or unspecified chronic kidney disease: Secondary | ICD-10-CM | POA: Diagnosis not present

## 2012-01-01 DIAGNOSIS — E119 Type 2 diabetes mellitus without complications: Secondary | ICD-10-CM | POA: Diagnosis not present

## 2012-01-01 DIAGNOSIS — E1129 Type 2 diabetes mellitus with other diabetic kidney complication: Secondary | ICD-10-CM | POA: Diagnosis not present

## 2012-01-01 DIAGNOSIS — I2510 Atherosclerotic heart disease of native coronary artery without angina pectoris: Secondary | ICD-10-CM | POA: Diagnosis not present

## 2012-01-01 DIAGNOSIS — Z95818 Presence of other cardiac implants and grafts: Secondary | ICD-10-CM | POA: Diagnosis not present

## 2012-01-01 DIAGNOSIS — Z7982 Long term (current) use of aspirin: Secondary | ICD-10-CM | POA: Diagnosis not present

## 2012-01-01 DIAGNOSIS — E039 Hypothyroidism, unspecified: Secondary | ICD-10-CM | POA: Diagnosis not present

## 2012-01-01 DIAGNOSIS — Z79811 Long term (current) use of aromatase inhibitors: Secondary | ICD-10-CM | POA: Diagnosis not present

## 2012-01-01 DIAGNOSIS — N189 Chronic kidney disease, unspecified: Secondary | ICD-10-CM | POA: Diagnosis not present

## 2012-01-01 DIAGNOSIS — D638 Anemia in other chronic diseases classified elsewhere: Secondary | ICD-10-CM | POA: Diagnosis not present

## 2012-01-01 DIAGNOSIS — E7800 Pure hypercholesterolemia, unspecified: Secondary | ICD-10-CM | POA: Diagnosis not present

## 2012-01-01 DIAGNOSIS — I10 Essential (primary) hypertension: Secondary | ICD-10-CM | POA: Diagnosis not present

## 2012-01-01 DIAGNOSIS — Z9010 Acquired absence of unspecified breast and nipple: Secondary | ICD-10-CM | POA: Diagnosis not present

## 2012-01-01 DIAGNOSIS — Z7902 Long term (current) use of antithrombotics/antiplatelets: Secondary | ICD-10-CM | POA: Diagnosis not present

## 2012-01-01 DIAGNOSIS — M899 Disorder of bone, unspecified: Secondary | ICD-10-CM | POA: Diagnosis not present

## 2012-01-01 DIAGNOSIS — M129 Arthropathy, unspecified: Secondary | ICD-10-CM | POA: Diagnosis not present

## 2012-01-01 DIAGNOSIS — C50919 Malignant neoplasm of unspecified site of unspecified female breast: Secondary | ICD-10-CM | POA: Diagnosis not present

## 2012-01-01 DIAGNOSIS — Z5181 Encounter for therapeutic drug level monitoring: Secondary | ICD-10-CM | POA: Diagnosis not present

## 2012-01-01 DIAGNOSIS — Z79899 Other long term (current) drug therapy: Secondary | ICD-10-CM | POA: Diagnosis not present

## 2012-01-12 LAB — CANCER CENTER HEMOGLOBIN: HGB: 11.7 g/dL — ABNORMAL LOW (ref 12.0–16.0)

## 2012-01-26 DIAGNOSIS — Z170 Estrogen receptor positive status [ER+]: Secondary | ICD-10-CM | POA: Diagnosis not present

## 2012-01-26 DIAGNOSIS — C50919 Malignant neoplasm of unspecified site of unspecified female breast: Secondary | ICD-10-CM | POA: Diagnosis not present

## 2012-01-26 DIAGNOSIS — D638 Anemia in other chronic diseases classified elsewhere: Secondary | ICD-10-CM | POA: Diagnosis not present

## 2012-01-26 DIAGNOSIS — Z79811 Long term (current) use of aromatase inhibitors: Secondary | ICD-10-CM | POA: Diagnosis not present

## 2012-01-26 LAB — BASIC METABOLIC PANEL
Anion Gap: 3 — ABNORMAL LOW (ref 7–16)
Calcium, Total: 9.5 mg/dL (ref 8.5–10.1)
Creatinine: 2.24 mg/dL — ABNORMAL HIGH (ref 0.60–1.30)
EGFR (African American): 22 — ABNORMAL LOW
EGFR (Non-African Amer.): 19 — ABNORMAL LOW
Glucose: 63 mg/dL — ABNORMAL LOW (ref 65–99)
Potassium: 5.4 mmol/L — ABNORMAL HIGH (ref 3.5–5.1)
Sodium: 136 mmol/L (ref 136–145)

## 2012-01-26 LAB — CBC CANCER CENTER
Basophil #: 0.1 x10 3/mm (ref 0.0–0.1)
Eosinophil #: 0.2 x10 3/mm (ref 0.0–0.7)
HCT: 32.8 % — ABNORMAL LOW (ref 35.0–47.0)
Lymphocyte #: 2.2 x10 3/mm (ref 1.0–3.6)
Lymphocyte %: 29.3 %
MCH: 29.9 pg (ref 26.0–34.0)
MCHC: 32.4 g/dL (ref 32.0–36.0)
MCV: 92 fL (ref 80–100)
Monocyte %: 9.6 %
Neutrophil %: 57.9 %
Platelet: 179 x10 3/mm (ref 150–440)
RDW: 16.2 % — ABNORMAL HIGH (ref 11.5–14.5)
WBC: 7.6 x10 3/mm (ref 3.6–11.0)

## 2012-01-26 LAB — IRON AND TIBC
Iron Saturation: 28 %
Iron: 83 ug/dL (ref 50–170)
Unbound Iron-Bind.Cap.: 213 ug/dL

## 2012-02-01 ENCOUNTER — Ambulatory Visit: Payer: Self-pay

## 2012-02-09 ENCOUNTER — Ambulatory Visit: Payer: Self-pay | Admitting: Oncology

## 2012-02-09 DIAGNOSIS — I2510 Atherosclerotic heart disease of native coronary artery without angina pectoris: Secondary | ICD-10-CM | POA: Diagnosis not present

## 2012-02-09 DIAGNOSIS — Z7982 Long term (current) use of aspirin: Secondary | ICD-10-CM | POA: Diagnosis not present

## 2012-02-09 DIAGNOSIS — C50919 Malignant neoplasm of unspecified site of unspecified female breast: Secondary | ICD-10-CM | POA: Diagnosis not present

## 2012-02-09 DIAGNOSIS — N189 Chronic kidney disease, unspecified: Secondary | ICD-10-CM | POA: Diagnosis not present

## 2012-02-09 DIAGNOSIS — I129 Hypertensive chronic kidney disease with stage 1 through stage 4 chronic kidney disease, or unspecified chronic kidney disease: Secondary | ICD-10-CM | POA: Diagnosis not present

## 2012-02-09 DIAGNOSIS — Z5181 Encounter for therapeutic drug level monitoring: Secondary | ICD-10-CM | POA: Diagnosis not present

## 2012-02-09 DIAGNOSIS — Z95818 Presence of other cardiac implants and grafts: Secondary | ICD-10-CM | POA: Diagnosis not present

## 2012-02-09 DIAGNOSIS — E039 Hypothyroidism, unspecified: Secondary | ICD-10-CM | POA: Diagnosis not present

## 2012-02-09 DIAGNOSIS — Z79899 Other long term (current) drug therapy: Secondary | ICD-10-CM | POA: Diagnosis not present

## 2012-02-09 DIAGNOSIS — M129 Arthropathy, unspecified: Secondary | ICD-10-CM | POA: Diagnosis not present

## 2012-02-09 DIAGNOSIS — Z9010 Acquired absence of unspecified breast and nipple: Secondary | ICD-10-CM | POA: Diagnosis not present

## 2012-02-09 DIAGNOSIS — Z170 Estrogen receptor positive status [ER+]: Secondary | ICD-10-CM | POA: Diagnosis not present

## 2012-02-09 DIAGNOSIS — D638 Anemia in other chronic diseases classified elsewhere: Secondary | ICD-10-CM | POA: Diagnosis not present

## 2012-02-09 DIAGNOSIS — E119 Type 2 diabetes mellitus without complications: Secondary | ICD-10-CM | POA: Diagnosis not present

## 2012-02-09 DIAGNOSIS — Z7902 Long term (current) use of antithrombotics/antiplatelets: Secondary | ICD-10-CM | POA: Diagnosis not present

## 2012-02-09 DIAGNOSIS — M899 Disorder of bone, unspecified: Secondary | ICD-10-CM | POA: Diagnosis not present

## 2012-02-09 DIAGNOSIS — Z79811 Long term (current) use of aromatase inhibitors: Secondary | ICD-10-CM | POA: Diagnosis not present

## 2012-02-09 LAB — CANCER CENTER HEMOGLOBIN: HGB: 10.3 g/dL — ABNORMAL LOW (ref 12.0–16.0)

## 2012-02-13 DIAGNOSIS — R42 Dizziness and giddiness: Secondary | ICD-10-CM | POA: Diagnosis not present

## 2012-02-13 DIAGNOSIS — E782 Mixed hyperlipidemia: Secondary | ICD-10-CM | POA: Diagnosis not present

## 2012-02-13 DIAGNOSIS — E119 Type 2 diabetes mellitus without complications: Secondary | ICD-10-CM | POA: Diagnosis not present

## 2012-02-23 ENCOUNTER — Emergency Department: Payer: Self-pay | Admitting: Emergency Medicine

## 2012-02-23 DIAGNOSIS — Z7982 Long term (current) use of aspirin: Secondary | ICD-10-CM | POA: Diagnosis not present

## 2012-02-23 DIAGNOSIS — I129 Hypertensive chronic kidney disease with stage 1 through stage 4 chronic kidney disease, or unspecified chronic kidney disease: Secondary | ICD-10-CM | POA: Diagnosis not present

## 2012-02-23 DIAGNOSIS — Z95818 Presence of other cardiac implants and grafts: Secondary | ICD-10-CM | POA: Diagnosis not present

## 2012-02-23 DIAGNOSIS — N390 Urinary tract infection, site not specified: Secondary | ICD-10-CM | POA: Diagnosis not present

## 2012-02-23 DIAGNOSIS — I2510 Atherosclerotic heart disease of native coronary artery without angina pectoris: Secondary | ICD-10-CM | POA: Diagnosis not present

## 2012-02-23 DIAGNOSIS — Z853 Personal history of malignant neoplasm of breast: Secondary | ICD-10-CM | POA: Diagnosis not present

## 2012-02-23 DIAGNOSIS — I959 Hypotension, unspecified: Secondary | ICD-10-CM | POA: Diagnosis not present

## 2012-02-23 DIAGNOSIS — R55 Syncope and collapse: Secondary | ICD-10-CM | POA: Diagnosis not present

## 2012-02-23 DIAGNOSIS — R42 Dizziness and giddiness: Secondary | ICD-10-CM | POA: Diagnosis not present

## 2012-02-23 DIAGNOSIS — Z8673 Personal history of transient ischemic attack (TIA), and cerebral infarction without residual deficits: Secondary | ICD-10-CM | POA: Diagnosis not present

## 2012-02-23 DIAGNOSIS — N189 Chronic kidney disease, unspecified: Secondary | ICD-10-CM | POA: Diagnosis not present

## 2012-02-23 DIAGNOSIS — Z79899 Other long term (current) drug therapy: Secondary | ICD-10-CM | POA: Diagnosis not present

## 2012-02-23 LAB — URINALYSIS, COMPLETE
Bilirubin,UR: NEGATIVE
Hyaline Cast: 3
Ketone: NEGATIVE
Nitrite: NEGATIVE
Ph: 6 (ref 4.5–8.0)
Specific Gravity: 1.008 (ref 1.003–1.030)
Squamous Epithelial: 2
Transitional Epi: 3
WBC UR: 33 /HPF (ref 0–5)

## 2012-02-23 LAB — COMPREHENSIVE METABOLIC PANEL
Anion Gap: 7 (ref 7–16)
BUN: 33 mg/dL — ABNORMAL HIGH (ref 7–18)
Calcium, Total: 9.8 mg/dL (ref 8.5–10.1)
Chloride: 106 mmol/L (ref 98–107)
Co2: 25 mmol/L (ref 21–32)
EGFR (African American): 21 — ABNORMAL LOW
EGFR (Non-African Amer.): 18 — ABNORMAL LOW
Potassium: 4.8 mmol/L (ref 3.5–5.1)
SGOT(AST): 27 U/L (ref 15–37)
Sodium: 138 mmol/L (ref 136–145)
Total Protein: 7.6 g/dL (ref 6.4–8.2)

## 2012-02-23 LAB — CBC
HGB: 10.4 g/dL — ABNORMAL LOW (ref 12.0–16.0)
RBC: 3.4 10*6/uL — ABNORMAL LOW (ref 3.80–5.20)

## 2012-02-23 LAB — PRO B NATRIURETIC PEPTIDE: B-Type Natriuretic Peptide: 198 pg/mL (ref 0–450)

## 2012-02-23 LAB — TROPONIN I: Troponin-I: <0.02 ng/mL

## 2012-02-25 DIAGNOSIS — R6889 Other general symptoms and signs: Secondary | ICD-10-CM | POA: Diagnosis not present

## 2012-02-28 DIAGNOSIS — N390 Urinary tract infection, site not specified: Secondary | ICD-10-CM | POA: Diagnosis not present

## 2012-02-28 DIAGNOSIS — N251 Nephrogenic diabetes insipidus: Secondary | ICD-10-CM | POA: Diagnosis not present

## 2012-03-03 ENCOUNTER — Ambulatory Visit: Payer: Self-pay | Admitting: Oncology

## 2012-03-03 DIAGNOSIS — E039 Hypothyroidism, unspecified: Secondary | ICD-10-CM | POA: Diagnosis not present

## 2012-03-03 DIAGNOSIS — M899 Disorder of bone, unspecified: Secondary | ICD-10-CM | POA: Diagnosis not present

## 2012-03-03 DIAGNOSIS — Z170 Estrogen receptor positive status [ER+]: Secondary | ICD-10-CM | POA: Diagnosis not present

## 2012-03-03 DIAGNOSIS — M129 Arthropathy, unspecified: Secondary | ICD-10-CM | POA: Diagnosis not present

## 2012-03-03 DIAGNOSIS — D638 Anemia in other chronic diseases classified elsewhere: Secondary | ICD-10-CM | POA: Diagnosis not present

## 2012-03-03 DIAGNOSIS — Z95818 Presence of other cardiac implants and grafts: Secondary | ICD-10-CM | POA: Diagnosis not present

## 2012-03-03 DIAGNOSIS — Z79899 Other long term (current) drug therapy: Secondary | ICD-10-CM | POA: Diagnosis not present

## 2012-03-03 DIAGNOSIS — R42 Dizziness and giddiness: Secondary | ICD-10-CM | POA: Diagnosis not present

## 2012-03-03 DIAGNOSIS — Z5181 Encounter for therapeutic drug level monitoring: Secondary | ICD-10-CM | POA: Diagnosis not present

## 2012-03-03 DIAGNOSIS — N189 Chronic kidney disease, unspecified: Secondary | ICD-10-CM | POA: Diagnosis not present

## 2012-03-03 DIAGNOSIS — E119 Type 2 diabetes mellitus without complications: Secondary | ICD-10-CM | POA: Diagnosis not present

## 2012-03-03 DIAGNOSIS — Z7902 Long term (current) use of antithrombotics/antiplatelets: Secondary | ICD-10-CM | POA: Diagnosis not present

## 2012-03-03 DIAGNOSIS — Z7982 Long term (current) use of aspirin: Secondary | ICD-10-CM | POA: Diagnosis not present

## 2012-03-03 DIAGNOSIS — Z9010 Acquired absence of unspecified breast and nipple: Secondary | ICD-10-CM | POA: Diagnosis not present

## 2012-03-03 DIAGNOSIS — C50919 Malignant neoplasm of unspecified site of unspecified female breast: Secondary | ICD-10-CM | POA: Diagnosis not present

## 2012-03-03 DIAGNOSIS — I129 Hypertensive chronic kidney disease with stage 1 through stage 4 chronic kidney disease, or unspecified chronic kidney disease: Secondary | ICD-10-CM | POA: Diagnosis not present

## 2012-03-03 DIAGNOSIS — I2510 Atherosclerotic heart disease of native coronary artery without angina pectoris: Secondary | ICD-10-CM | POA: Diagnosis not present

## 2012-03-22 DIAGNOSIS — D638 Anemia in other chronic diseases classified elsewhere: Secondary | ICD-10-CM | POA: Diagnosis not present

## 2012-03-22 DIAGNOSIS — C50919 Malignant neoplasm of unspecified site of unspecified female breast: Secondary | ICD-10-CM | POA: Diagnosis not present

## 2012-03-22 DIAGNOSIS — Z170 Estrogen receptor positive status [ER+]: Secondary | ICD-10-CM | POA: Diagnosis not present

## 2012-03-22 DIAGNOSIS — N189 Chronic kidney disease, unspecified: Secondary | ICD-10-CM | POA: Diagnosis not present

## 2012-03-22 LAB — CBC CANCER CENTER
Basophil #: 0.1 x10 3/mm (ref 0.0–0.1)
Basophil %: 1.7 %
Eosinophil #: 0.1 x10 3/mm (ref 0.0–0.7)
Eosinophil %: 2.2 %
HCT: 32.2 % — ABNORMAL LOW (ref 35.0–47.0)
HGB: 10.2 g/dL — ABNORMAL LOW (ref 12.0–16.0)
Lymphocyte #: 2.1 x10 3/mm (ref 1.0–3.6)
MCH: 30.3 pg (ref 26.0–34.0)
MCHC: 31.7 g/dL — ABNORMAL LOW (ref 32.0–36.0)
MCV: 96 fL (ref 80–100)
Monocyte #: 0.8 x10 3/mm (ref 0.2–0.9)
Neutrophil #: 3.5 x10 3/mm (ref 1.4–6.5)
RDW: 16.1 % — ABNORMAL HIGH (ref 11.5–14.5)

## 2012-03-22 LAB — COMPREHENSIVE METABOLIC PANEL
Albumin: 3.6 g/dL (ref 3.4–5.0)
Alkaline Phosphatase: 95 U/L (ref 50–136)
BUN: 31 mg/dL — ABNORMAL HIGH (ref 7–18)
Calcium, Total: 10.1 mg/dL (ref 8.5–10.1)
Glucose: 86 mg/dL (ref 65–99)
Osmolality: 282 (ref 275–301)
Potassium: 4.8 mmol/L (ref 3.5–5.1)
SGOT(AST): 18 U/L (ref 15–37)
Sodium: 138 mmol/L (ref 136–145)
Total Protein: 7.4 g/dL (ref 6.4–8.2)

## 2012-03-29 DIAGNOSIS — I10 Essential (primary) hypertension: Secondary | ICD-10-CM | POA: Diagnosis not present

## 2012-03-29 DIAGNOSIS — N184 Chronic kidney disease, stage 4 (severe): Secondary | ICD-10-CM | POA: Diagnosis not present

## 2012-03-29 DIAGNOSIS — D631 Anemia in chronic kidney disease: Secondary | ICD-10-CM | POA: Diagnosis not present

## 2012-03-29 DIAGNOSIS — N039 Chronic nephritic syndrome with unspecified morphologic changes: Secondary | ICD-10-CM | POA: Diagnosis not present

## 2012-03-29 DIAGNOSIS — N2581 Secondary hyperparathyroidism of renal origin: Secondary | ICD-10-CM | POA: Diagnosis not present

## 2012-04-02 ENCOUNTER — Ambulatory Visit: Payer: Self-pay | Admitting: Oncology

## 2012-04-02 DIAGNOSIS — Z79899 Other long term (current) drug therapy: Secondary | ICD-10-CM | POA: Diagnosis not present

## 2012-04-02 DIAGNOSIS — Z170 Estrogen receptor positive status [ER+]: Secondary | ICD-10-CM | POA: Diagnosis not present

## 2012-04-02 DIAGNOSIS — Z7982 Long term (current) use of aspirin: Secondary | ICD-10-CM | POA: Diagnosis not present

## 2012-04-02 DIAGNOSIS — Z95818 Presence of other cardiac implants and grafts: Secondary | ICD-10-CM | POA: Diagnosis not present

## 2012-04-02 DIAGNOSIS — IMO0002 Reserved for concepts with insufficient information to code with codable children: Secondary | ICD-10-CM | POA: Diagnosis not present

## 2012-04-02 DIAGNOSIS — D638 Anemia in other chronic diseases classified elsewhere: Secondary | ICD-10-CM | POA: Diagnosis not present

## 2012-04-02 DIAGNOSIS — N189 Chronic kidney disease, unspecified: Secondary | ICD-10-CM | POA: Diagnosis not present

## 2012-04-02 DIAGNOSIS — I129 Hypertensive chronic kidney disease with stage 1 through stage 4 chronic kidney disease, or unspecified chronic kidney disease: Secondary | ICD-10-CM | POA: Diagnosis not present

## 2012-04-02 DIAGNOSIS — I2510 Atherosclerotic heart disease of native coronary artery without angina pectoris: Secondary | ICD-10-CM | POA: Diagnosis not present

## 2012-04-02 DIAGNOSIS — M129 Arthropathy, unspecified: Secondary | ICD-10-CM | POA: Diagnosis not present

## 2012-04-02 DIAGNOSIS — M899 Disorder of bone, unspecified: Secondary | ICD-10-CM | POA: Diagnosis not present

## 2012-04-02 DIAGNOSIS — C50919 Malignant neoplasm of unspecified site of unspecified female breast: Secondary | ICD-10-CM | POA: Diagnosis not present

## 2012-04-02 DIAGNOSIS — Z5181 Encounter for therapeutic drug level monitoring: Secondary | ICD-10-CM | POA: Diagnosis not present

## 2012-04-02 DIAGNOSIS — E119 Type 2 diabetes mellitus without complications: Secondary | ICD-10-CM | POA: Diagnosis not present

## 2012-04-02 DIAGNOSIS — Z9010 Acquired absence of unspecified breast and nipple: Secondary | ICD-10-CM | POA: Diagnosis not present

## 2012-04-02 DIAGNOSIS — Z7902 Long term (current) use of antithrombotics/antiplatelets: Secondary | ICD-10-CM | POA: Diagnosis not present

## 2012-04-02 DIAGNOSIS — E039 Hypothyroidism, unspecified: Secondary | ICD-10-CM | POA: Diagnosis not present

## 2012-04-02 DIAGNOSIS — R42 Dizziness and giddiness: Secondary | ICD-10-CM | POA: Diagnosis not present

## 2012-04-05 LAB — CANCER CENTER HEMOGLOBIN: HGB: 10.1 g/dL — ABNORMAL LOW (ref 12.0–16.0)

## 2012-04-09 DIAGNOSIS — I10 Essential (primary) hypertension: Secondary | ICD-10-CM | POA: Diagnosis not present

## 2012-04-09 DIAGNOSIS — I209 Angina pectoris, unspecified: Secondary | ICD-10-CM | POA: Diagnosis not present

## 2012-04-09 DIAGNOSIS — E782 Mixed hyperlipidemia: Secondary | ICD-10-CM | POA: Diagnosis not present

## 2012-04-09 DIAGNOSIS — I059 Rheumatic mitral valve disease, unspecified: Secondary | ICD-10-CM | POA: Diagnosis not present

## 2012-04-18 DIAGNOSIS — R0989 Other specified symptoms and signs involving the circulatory and respiratory systems: Secondary | ICD-10-CM | POA: Diagnosis not present

## 2012-04-18 DIAGNOSIS — K219 Gastro-esophageal reflux disease without esophagitis: Secondary | ICD-10-CM | POA: Diagnosis not present

## 2012-04-18 DIAGNOSIS — R0609 Other forms of dyspnea: Secondary | ICD-10-CM | POA: Diagnosis not present

## 2012-04-18 DIAGNOSIS — K5900 Constipation, unspecified: Secondary | ICD-10-CM | POA: Diagnosis not present

## 2012-04-19 LAB — CANCER CENTER HEMOGLOBIN: HGB: 10.4 g/dL — ABNORMAL LOW (ref 12.0–16.0)

## 2012-04-30 DIAGNOSIS — M79609 Pain in unspecified limb: Secondary | ICD-10-CM | POA: Diagnosis not present

## 2012-04-30 DIAGNOSIS — N189 Chronic kidney disease, unspecified: Secondary | ICD-10-CM | POA: Diagnosis not present

## 2012-04-30 DIAGNOSIS — I87099 Postthrombotic syndrome with other complications of unspecified lower extremity: Secondary | ICD-10-CM | POA: Diagnosis not present

## 2012-04-30 DIAGNOSIS — M7989 Other specified soft tissue disorders: Secondary | ICD-10-CM | POA: Diagnosis not present

## 2012-05-03 ENCOUNTER — Ambulatory Visit: Payer: Self-pay | Admitting: Oncology

## 2012-05-03 DIAGNOSIS — C50919 Malignant neoplasm of unspecified site of unspecified female breast: Secondary | ICD-10-CM | POA: Diagnosis not present

## 2012-05-03 DIAGNOSIS — D638 Anemia in other chronic diseases classified elsewhere: Secondary | ICD-10-CM | POA: Diagnosis not present

## 2012-05-03 DIAGNOSIS — M899 Disorder of bone, unspecified: Secondary | ICD-10-CM | POA: Diagnosis not present

## 2012-05-03 DIAGNOSIS — E119 Type 2 diabetes mellitus without complications: Secondary | ICD-10-CM | POA: Diagnosis not present

## 2012-05-03 DIAGNOSIS — E039 Hypothyroidism, unspecified: Secondary | ICD-10-CM | POA: Diagnosis not present

## 2012-05-03 DIAGNOSIS — I2510 Atherosclerotic heart disease of native coronary artery without angina pectoris: Secondary | ICD-10-CM | POA: Diagnosis not present

## 2012-05-03 DIAGNOSIS — Z79899 Other long term (current) drug therapy: Secondary | ICD-10-CM | POA: Diagnosis not present

## 2012-05-03 DIAGNOSIS — I129 Hypertensive chronic kidney disease with stage 1 through stage 4 chronic kidney disease, or unspecified chronic kidney disease: Secondary | ICD-10-CM | POA: Diagnosis not present

## 2012-05-03 DIAGNOSIS — Z7982 Long term (current) use of aspirin: Secondary | ICD-10-CM | POA: Diagnosis not present

## 2012-05-03 DIAGNOSIS — R42 Dizziness and giddiness: Secondary | ICD-10-CM | POA: Diagnosis not present

## 2012-05-03 DIAGNOSIS — Z7902 Long term (current) use of antithrombotics/antiplatelets: Secondary | ICD-10-CM | POA: Diagnosis not present

## 2012-05-03 DIAGNOSIS — N189 Chronic kidney disease, unspecified: Secondary | ICD-10-CM | POA: Diagnosis not present

## 2012-05-03 DIAGNOSIS — Z170 Estrogen receptor positive status [ER+]: Secondary | ICD-10-CM | POA: Diagnosis not present

## 2012-05-03 DIAGNOSIS — Z9010 Acquired absence of unspecified breast and nipple: Secondary | ICD-10-CM | POA: Diagnosis not present

## 2012-05-03 DIAGNOSIS — M129 Arthropathy, unspecified: Secondary | ICD-10-CM | POA: Diagnosis not present

## 2012-05-03 DIAGNOSIS — Z95818 Presence of other cardiac implants and grafts: Secondary | ICD-10-CM | POA: Diagnosis not present

## 2012-05-03 DIAGNOSIS — Z5181 Encounter for therapeutic drug level monitoring: Secondary | ICD-10-CM | POA: Diagnosis not present

## 2012-05-08 DIAGNOSIS — R0602 Shortness of breath: Secondary | ICD-10-CM | POA: Diagnosis not present

## 2012-05-08 DIAGNOSIS — J984 Other disorders of lung: Secondary | ICD-10-CM | POA: Diagnosis not present

## 2012-05-08 DIAGNOSIS — J8410 Pulmonary fibrosis, unspecified: Secondary | ICD-10-CM | POA: Diagnosis not present

## 2012-05-10 DIAGNOSIS — C50919 Malignant neoplasm of unspecified site of unspecified female breast: Secondary | ICD-10-CM | POA: Diagnosis not present

## 2012-05-10 DIAGNOSIS — D638 Anemia in other chronic diseases classified elsewhere: Secondary | ICD-10-CM | POA: Diagnosis not present

## 2012-05-10 DIAGNOSIS — Z170 Estrogen receptor positive status [ER+]: Secondary | ICD-10-CM | POA: Diagnosis not present

## 2012-05-10 DIAGNOSIS — N189 Chronic kidney disease, unspecified: Secondary | ICD-10-CM | POA: Diagnosis not present

## 2012-05-10 LAB — CBC CANCER CENTER
Basophil #: 0.1 x10 3/mm (ref 0.0–0.1)
Basophil %: 1.3 %
Eosinophil #: 0.2 x10 3/mm (ref 0.0–0.7)
HGB: 9.5 g/dL — ABNORMAL LOW (ref 12.0–16.0)
Lymphocyte %: 25.9 %
MCHC: 32.1 g/dL (ref 32.0–36.0)
MCV: 95 fL (ref 80–100)
Neutrophil %: 62.6 %
Platelet: 195 x10 3/mm (ref 150–440)
RDW: 15 % — ABNORMAL HIGH (ref 11.5–14.5)

## 2012-05-10 LAB — BASIC METABOLIC PANEL
Calcium, Total: 9.7 mg/dL (ref 8.5–10.1)
Chloride: 105 mmol/L (ref 98–107)
EGFR (Non-African Amer.): 23 — ABNORMAL LOW
Glucose: 123 mg/dL — ABNORMAL HIGH (ref 65–99)
Potassium: 4.9 mmol/L (ref 3.5–5.1)
Sodium: 138 mmol/L (ref 136–145)

## 2012-05-14 ENCOUNTER — Ambulatory Visit: Payer: Self-pay | Admitting: Specialist

## 2012-05-14 DIAGNOSIS — J8410 Pulmonary fibrosis, unspecified: Secondary | ICD-10-CM | POA: Diagnosis not present

## 2012-05-14 DIAGNOSIS — R918 Other nonspecific abnormal finding of lung field: Secondary | ICD-10-CM | POA: Diagnosis not present

## 2012-05-14 DIAGNOSIS — J479 Bronchiectasis, uncomplicated: Secondary | ICD-10-CM | POA: Diagnosis not present

## 2012-05-14 DIAGNOSIS — J90 Pleural effusion, not elsewhere classified: Secondary | ICD-10-CM | POA: Diagnosis not present

## 2012-05-16 DIAGNOSIS — Z85828 Personal history of other malignant neoplasm of skin: Secondary | ICD-10-CM | POA: Diagnosis not present

## 2012-05-16 DIAGNOSIS — L819 Disorder of pigmentation, unspecified: Secondary | ICD-10-CM | POA: Diagnosis not present

## 2012-05-16 DIAGNOSIS — L570 Actinic keratosis: Secondary | ICD-10-CM | POA: Diagnosis not present

## 2012-05-23 DIAGNOSIS — J984 Other disorders of lung: Secondary | ICD-10-CM | POA: Diagnosis not present

## 2012-05-23 DIAGNOSIS — J8410 Pulmonary fibrosis, unspecified: Secondary | ICD-10-CM | POA: Diagnosis not present

## 2012-05-23 DIAGNOSIS — J479 Bronchiectasis, uncomplicated: Secondary | ICD-10-CM | POA: Diagnosis not present

## 2012-06-02 ENCOUNTER — Ambulatory Visit: Payer: Self-pay | Admitting: Oncology

## 2012-06-02 DIAGNOSIS — Z7982 Long term (current) use of aspirin: Secondary | ICD-10-CM | POA: Diagnosis not present

## 2012-06-02 DIAGNOSIS — I2510 Atherosclerotic heart disease of native coronary artery without angina pectoris: Secondary | ICD-10-CM | POA: Diagnosis not present

## 2012-06-02 DIAGNOSIS — C50919 Malignant neoplasm of unspecified site of unspecified female breast: Secondary | ICD-10-CM | POA: Diagnosis not present

## 2012-06-02 DIAGNOSIS — M899 Disorder of bone, unspecified: Secondary | ICD-10-CM | POA: Diagnosis not present

## 2012-06-02 DIAGNOSIS — R42 Dizziness and giddiness: Secondary | ICD-10-CM | POA: Diagnosis not present

## 2012-06-02 DIAGNOSIS — M129 Arthropathy, unspecified: Secondary | ICD-10-CM | POA: Diagnosis not present

## 2012-06-02 DIAGNOSIS — E119 Type 2 diabetes mellitus without complications: Secondary | ICD-10-CM | POA: Diagnosis not present

## 2012-06-02 DIAGNOSIS — I129 Hypertensive chronic kidney disease with stage 1 through stage 4 chronic kidney disease, or unspecified chronic kidney disease: Secondary | ICD-10-CM | POA: Diagnosis not present

## 2012-06-02 DIAGNOSIS — Z5181 Encounter for therapeutic drug level monitoring: Secondary | ICD-10-CM | POA: Diagnosis not present

## 2012-06-02 DIAGNOSIS — E039 Hypothyroidism, unspecified: Secondary | ICD-10-CM | POA: Diagnosis not present

## 2012-06-02 DIAGNOSIS — Z79899 Other long term (current) drug therapy: Secondary | ICD-10-CM | POA: Diagnosis not present

## 2012-06-02 DIAGNOSIS — Z9010 Acquired absence of unspecified breast and nipple: Secondary | ICD-10-CM | POA: Diagnosis not present

## 2012-06-02 DIAGNOSIS — Z7902 Long term (current) use of antithrombotics/antiplatelets: Secondary | ICD-10-CM | POA: Diagnosis not present

## 2012-06-02 DIAGNOSIS — Z95818 Presence of other cardiac implants and grafts: Secondary | ICD-10-CM | POA: Diagnosis not present

## 2012-06-02 DIAGNOSIS — D638 Anemia in other chronic diseases classified elsewhere: Secondary | ICD-10-CM | POA: Diagnosis not present

## 2012-06-02 DIAGNOSIS — Z170 Estrogen receptor positive status [ER+]: Secondary | ICD-10-CM | POA: Diagnosis not present

## 2012-06-02 DIAGNOSIS — N189 Chronic kidney disease, unspecified: Secondary | ICD-10-CM | POA: Diagnosis not present

## 2012-06-03 LAB — CANCER CENTER HEMOGLOBIN: HGB: 10.5 g/dL — ABNORMAL LOW (ref 12.0–16.0)

## 2012-06-19 DIAGNOSIS — I701 Atherosclerosis of renal artery: Secondary | ICD-10-CM | POA: Diagnosis not present

## 2012-06-21 DIAGNOSIS — I701 Atherosclerosis of renal artery: Secondary | ICD-10-CM | POA: Diagnosis not present

## 2012-06-21 DIAGNOSIS — N039 Chronic nephritic syndrome with unspecified morphologic changes: Secondary | ICD-10-CM | POA: Diagnosis not present

## 2012-06-21 DIAGNOSIS — I10 Essential (primary) hypertension: Secondary | ICD-10-CM | POA: Diagnosis not present

## 2012-06-21 DIAGNOSIS — N2581 Secondary hyperparathyroidism of renal origin: Secondary | ICD-10-CM | POA: Diagnosis not present

## 2012-06-21 DIAGNOSIS — N184 Chronic kidney disease, stage 4 (severe): Secondary | ICD-10-CM | POA: Diagnosis not present

## 2012-06-21 LAB — CANCER CENTER HEMOGLOBIN: HGB: 11.2 g/dL — ABNORMAL LOW (ref 12.0–16.0)

## 2012-06-30 DIAGNOSIS — J069 Acute upper respiratory infection, unspecified: Secondary | ICD-10-CM | POA: Diagnosis not present

## 2012-07-03 ENCOUNTER — Ambulatory Visit: Payer: Self-pay | Admitting: Oncology

## 2012-07-03 DIAGNOSIS — R42 Dizziness and giddiness: Secondary | ICD-10-CM | POA: Diagnosis not present

## 2012-07-03 DIAGNOSIS — Z5181 Encounter for therapeutic drug level monitoring: Secondary | ICD-10-CM | POA: Diagnosis not present

## 2012-07-03 DIAGNOSIS — Z9010 Acquired absence of unspecified breast and nipple: Secondary | ICD-10-CM | POA: Diagnosis not present

## 2012-07-03 DIAGNOSIS — I2510 Atherosclerotic heart disease of native coronary artery without angina pectoris: Secondary | ICD-10-CM | POA: Diagnosis not present

## 2012-07-03 DIAGNOSIS — Z95818 Presence of other cardiac implants and grafts: Secondary | ICD-10-CM | POA: Diagnosis not present

## 2012-07-03 DIAGNOSIS — E039 Hypothyroidism, unspecified: Secondary | ICD-10-CM | POA: Diagnosis not present

## 2012-07-03 DIAGNOSIS — I129 Hypertensive chronic kidney disease with stage 1 through stage 4 chronic kidney disease, or unspecified chronic kidney disease: Secondary | ICD-10-CM | POA: Diagnosis not present

## 2012-07-03 DIAGNOSIS — M129 Arthropathy, unspecified: Secondary | ICD-10-CM | POA: Diagnosis not present

## 2012-07-03 DIAGNOSIS — D638 Anemia in other chronic diseases classified elsewhere: Secondary | ICD-10-CM | POA: Diagnosis not present

## 2012-07-03 DIAGNOSIS — C50919 Malignant neoplasm of unspecified site of unspecified female breast: Secondary | ICD-10-CM | POA: Diagnosis not present

## 2012-07-03 DIAGNOSIS — N189 Chronic kidney disease, unspecified: Secondary | ICD-10-CM | POA: Diagnosis not present

## 2012-07-03 DIAGNOSIS — Z170 Estrogen receptor positive status [ER+]: Secondary | ICD-10-CM | POA: Diagnosis not present

## 2012-07-03 DIAGNOSIS — Z7902 Long term (current) use of antithrombotics/antiplatelets: Secondary | ICD-10-CM | POA: Diagnosis not present

## 2012-07-03 DIAGNOSIS — E119 Type 2 diabetes mellitus without complications: Secondary | ICD-10-CM | POA: Diagnosis not present

## 2012-07-03 DIAGNOSIS — Z7982 Long term (current) use of aspirin: Secondary | ICD-10-CM | POA: Diagnosis not present

## 2012-07-03 DIAGNOSIS — Z79899 Other long term (current) drug therapy: Secondary | ICD-10-CM | POA: Diagnosis not present

## 2012-07-03 DIAGNOSIS — M899 Disorder of bone, unspecified: Secondary | ICD-10-CM | POA: Diagnosis not present

## 2012-07-09 DIAGNOSIS — Z79899 Other long term (current) drug therapy: Secondary | ICD-10-CM | POA: Diagnosis not present

## 2012-07-09 DIAGNOSIS — I10 Essential (primary) hypertension: Secondary | ICD-10-CM | POA: Diagnosis not present

## 2012-07-09 DIAGNOSIS — E7800 Pure hypercholesterolemia, unspecified: Secondary | ICD-10-CM | POA: Diagnosis not present

## 2012-07-09 DIAGNOSIS — R569 Unspecified convulsions: Secondary | ICD-10-CM | POA: Diagnosis not present

## 2012-07-30 DIAGNOSIS — N184 Chronic kidney disease, stage 4 (severe): Secondary | ICD-10-CM | POA: Diagnosis not present

## 2012-07-30 DIAGNOSIS — I10 Essential (primary) hypertension: Secondary | ICD-10-CM | POA: Diagnosis not present

## 2012-07-30 DIAGNOSIS — D631 Anemia in chronic kidney disease: Secondary | ICD-10-CM | POA: Diagnosis not present

## 2012-08-03 ENCOUNTER — Ambulatory Visit: Payer: Self-pay | Admitting: Oncology

## 2012-08-27 DIAGNOSIS — M79609 Pain in unspecified limb: Secondary | ICD-10-CM | POA: Diagnosis not present

## 2012-08-27 DIAGNOSIS — I872 Venous insufficiency (chronic) (peripheral): Secondary | ICD-10-CM | POA: Diagnosis not present

## 2012-08-27 DIAGNOSIS — M7989 Other specified soft tissue disorders: Secondary | ICD-10-CM | POA: Diagnosis not present

## 2012-09-10 ENCOUNTER — Ambulatory Visit: Payer: Self-pay | Admitting: Internal Medicine

## 2012-09-10 DIAGNOSIS — Z79811 Long term (current) use of aromatase inhibitors: Secondary | ICD-10-CM | POA: Diagnosis not present

## 2012-09-10 DIAGNOSIS — Z9010 Acquired absence of unspecified breast and nipple: Secondary | ICD-10-CM | POA: Diagnosis not present

## 2012-09-10 DIAGNOSIS — E039 Hypothyroidism, unspecified: Secondary | ICD-10-CM | POA: Diagnosis not present

## 2012-09-10 DIAGNOSIS — N189 Chronic kidney disease, unspecified: Secondary | ICD-10-CM | POA: Diagnosis not present

## 2012-09-10 DIAGNOSIS — E119 Type 2 diabetes mellitus without complications: Secondary | ICD-10-CM | POA: Diagnosis not present

## 2012-09-10 DIAGNOSIS — Z95818 Presence of other cardiac implants and grafts: Secondary | ICD-10-CM | POA: Diagnosis not present

## 2012-09-10 DIAGNOSIS — Z87891 Personal history of nicotine dependence: Secondary | ICD-10-CM | POA: Diagnosis not present

## 2012-09-10 DIAGNOSIS — R0602 Shortness of breath: Secondary | ICD-10-CM | POA: Diagnosis not present

## 2012-09-10 DIAGNOSIS — D631 Anemia in chronic kidney disease: Secondary | ICD-10-CM | POA: Diagnosis not present

## 2012-09-10 DIAGNOSIS — Z79899 Other long term (current) drug therapy: Secondary | ICD-10-CM | POA: Diagnosis not present

## 2012-09-10 DIAGNOSIS — Z170 Estrogen receptor positive status [ER+]: Secondary | ICD-10-CM | POA: Diagnosis not present

## 2012-09-10 DIAGNOSIS — K279 Peptic ulcer, site unspecified, unspecified as acute or chronic, without hemorrhage or perforation: Secondary | ICD-10-CM | POA: Diagnosis not present

## 2012-09-10 DIAGNOSIS — Z803 Family history of malignant neoplasm of breast: Secondary | ICD-10-CM | POA: Diagnosis not present

## 2012-09-10 DIAGNOSIS — R5381 Other malaise: Secondary | ICD-10-CM | POA: Diagnosis not present

## 2012-09-10 DIAGNOSIS — E7800 Pure hypercholesterolemia, unspecified: Secondary | ICD-10-CM | POA: Diagnosis not present

## 2012-09-20 DIAGNOSIS — R5381 Other malaise: Secondary | ICD-10-CM | POA: Diagnosis not present

## 2012-09-20 DIAGNOSIS — N039 Chronic nephritic syndrome with unspecified morphologic changes: Secondary | ICD-10-CM | POA: Diagnosis not present

## 2012-09-20 DIAGNOSIS — D631 Anemia in chronic kidney disease: Secondary | ICD-10-CM | POA: Diagnosis not present

## 2012-09-20 DIAGNOSIS — Z79811 Long term (current) use of aromatase inhibitors: Secondary | ICD-10-CM | POA: Diagnosis not present

## 2012-09-20 DIAGNOSIS — C50919 Malignant neoplasm of unspecified site of unspecified female breast: Secondary | ICD-10-CM | POA: Diagnosis not present

## 2012-09-20 DIAGNOSIS — N189 Chronic kidney disease, unspecified: Secondary | ICD-10-CM | POA: Diagnosis not present

## 2012-09-20 DIAGNOSIS — Z170 Estrogen receptor positive status [ER+]: Secondary | ICD-10-CM | POA: Diagnosis not present

## 2012-09-24 DIAGNOSIS — I10 Essential (primary) hypertension: Secondary | ICD-10-CM | POA: Diagnosis not present

## 2012-09-24 DIAGNOSIS — I701 Atherosclerosis of renal artery: Secondary | ICD-10-CM | POA: Diagnosis not present

## 2012-09-24 DIAGNOSIS — D631 Anemia in chronic kidney disease: Secondary | ICD-10-CM | POA: Diagnosis not present

## 2012-09-24 DIAGNOSIS — N039 Chronic nephritic syndrome with unspecified morphologic changes: Secondary | ICD-10-CM | POA: Diagnosis not present

## 2012-09-24 DIAGNOSIS — N2581 Secondary hyperparathyroidism of renal origin: Secondary | ICD-10-CM | POA: Diagnosis not present

## 2012-09-24 DIAGNOSIS — N184 Chronic kidney disease, stage 4 (severe): Secondary | ICD-10-CM | POA: Diagnosis not present

## 2012-10-01 ENCOUNTER — Ambulatory Visit: Payer: Self-pay | Admitting: Internal Medicine

## 2012-10-01 DIAGNOSIS — N189 Chronic kidney disease, unspecified: Secondary | ICD-10-CM | POA: Diagnosis not present

## 2012-10-01 DIAGNOSIS — C50919 Malignant neoplasm of unspecified site of unspecified female breast: Secondary | ICD-10-CM | POA: Diagnosis not present

## 2012-10-01 DIAGNOSIS — D638 Anemia in other chronic diseases classified elsewhere: Secondary | ICD-10-CM | POA: Diagnosis not present

## 2012-10-01 DIAGNOSIS — Z170 Estrogen receptor positive status [ER+]: Secondary | ICD-10-CM | POA: Diagnosis not present

## 2012-10-28 DIAGNOSIS — I119 Hypertensive heart disease without heart failure: Secondary | ICD-10-CM | POA: Diagnosis not present

## 2012-10-28 DIAGNOSIS — E782 Mixed hyperlipidemia: Secondary | ICD-10-CM | POA: Diagnosis not present

## 2012-10-28 DIAGNOSIS — I059 Rheumatic mitral valve disease, unspecified: Secondary | ICD-10-CM | POA: Diagnosis not present

## 2012-10-28 DIAGNOSIS — I2510 Atherosclerotic heart disease of native coronary artery without angina pectoris: Secondary | ICD-10-CM | POA: Diagnosis not present

## 2012-10-31 ENCOUNTER — Ambulatory Visit: Payer: Self-pay | Admitting: Internal Medicine

## 2012-10-31 ENCOUNTER — Ambulatory Visit: Payer: Self-pay | Admitting: Oncology

## 2012-10-31 DIAGNOSIS — D638 Anemia in other chronic diseases classified elsewhere: Secondary | ICD-10-CM | POA: Diagnosis not present

## 2012-10-31 DIAGNOSIS — C50919 Malignant neoplasm of unspecified site of unspecified female breast: Secondary | ICD-10-CM | POA: Diagnosis not present

## 2012-10-31 DIAGNOSIS — Z170 Estrogen receptor positive status [ER+]: Secondary | ICD-10-CM | POA: Diagnosis not present

## 2012-10-31 DIAGNOSIS — N189 Chronic kidney disease, unspecified: Secondary | ICD-10-CM | POA: Diagnosis not present

## 2012-11-20 DIAGNOSIS — I059 Rheumatic mitral valve disease, unspecified: Secondary | ICD-10-CM | POA: Diagnosis not present

## 2012-11-22 LAB — CANCER CENTER HEMOGLOBIN: HGB: 10.9 g/dL — ABNORMAL LOW (ref 12.0–16.0)

## 2012-11-27 DIAGNOSIS — I2510 Atherosclerotic heart disease of native coronary artery without angina pectoris: Secondary | ICD-10-CM | POA: Diagnosis not present

## 2012-11-27 DIAGNOSIS — M79609 Pain in unspecified limb: Secondary | ICD-10-CM | POA: Diagnosis not present

## 2012-11-27 DIAGNOSIS — I119 Hypertensive heart disease without heart failure: Secondary | ICD-10-CM | POA: Diagnosis not present

## 2012-11-27 DIAGNOSIS — I495 Sick sinus syndrome: Secondary | ICD-10-CM | POA: Diagnosis not present

## 2012-12-01 ENCOUNTER — Ambulatory Visit: Payer: Self-pay | Admitting: Family Medicine

## 2012-12-06 ENCOUNTER — Emergency Department: Payer: Self-pay | Admitting: Emergency Medicine

## 2012-12-06 DIAGNOSIS — Z79899 Other long term (current) drug therapy: Secondary | ICD-10-CM | POA: Diagnosis not present

## 2012-12-06 DIAGNOSIS — I2510 Atherosclerotic heart disease of native coronary artery without angina pectoris: Secondary | ICD-10-CM | POA: Diagnosis not present

## 2012-12-06 DIAGNOSIS — Z95818 Presence of other cardiac implants and grafts: Secondary | ICD-10-CM | POA: Diagnosis not present

## 2012-12-06 DIAGNOSIS — I10 Essential (primary) hypertension: Secondary | ICD-10-CM | POA: Diagnosis not present

## 2012-12-06 DIAGNOSIS — R0789 Other chest pain: Secondary | ICD-10-CM | POA: Diagnosis not present

## 2012-12-06 DIAGNOSIS — E785 Hyperlipidemia, unspecified: Secondary | ICD-10-CM | POA: Diagnosis not present

## 2012-12-06 DIAGNOSIS — R071 Chest pain on breathing: Secondary | ICD-10-CM | POA: Diagnosis not present

## 2012-12-06 DIAGNOSIS — J449 Chronic obstructive pulmonary disease, unspecified: Secondary | ICD-10-CM | POA: Diagnosis not present

## 2012-12-06 DIAGNOSIS — R079 Chest pain, unspecified: Secondary | ICD-10-CM | POA: Diagnosis not present

## 2012-12-06 DIAGNOSIS — E119 Type 2 diabetes mellitus without complications: Secondary | ICD-10-CM | POA: Diagnosis not present

## 2012-12-06 DIAGNOSIS — R6889 Other general symptoms and signs: Secondary | ICD-10-CM | POA: Diagnosis not present

## 2012-12-06 LAB — CBC
HCT: 32.6 % — ABNORMAL LOW (ref 35.0–47.0)
HGB: 11 g/dL — ABNORMAL LOW (ref 12.0–16.0)
MCH: 31.1 pg (ref 26.0–34.0)
MCV: 92 fL (ref 80–100)
Platelet: 179 10*3/uL (ref 150–440)
RBC: 3.53 10*6/uL — ABNORMAL LOW (ref 3.80–5.20)
RDW: 14.7 % — ABNORMAL HIGH (ref 11.5–14.5)

## 2012-12-06 LAB — BASIC METABOLIC PANEL
Anion Gap: 6 — ABNORMAL LOW (ref 7–16)
BUN: 43 mg/dL — ABNORMAL HIGH (ref 7–18)
Calcium, Total: 10.2 mg/dL — ABNORMAL HIGH (ref 8.5–10.1)
Co2: 26 mmol/L (ref 21–32)
Creatinine: 2.07 mg/dL — ABNORMAL HIGH (ref 0.60–1.30)
EGFR (African American): 24 — ABNORMAL LOW
EGFR (Non-African Amer.): 21 — ABNORMAL LOW
Glucose: 157 mg/dL — ABNORMAL HIGH (ref 65–99)

## 2012-12-06 LAB — TROPONIN I: Troponin-I: <0.02 ng/mL

## 2012-12-12 DIAGNOSIS — M25559 Pain in unspecified hip: Secondary | ICD-10-CM | POA: Diagnosis not present

## 2012-12-19 DIAGNOSIS — D631 Anemia in chronic kidney disease: Secondary | ICD-10-CM | POA: Diagnosis not present

## 2012-12-19 DIAGNOSIS — N2581 Secondary hyperparathyroidism of renal origin: Secondary | ICD-10-CM | POA: Diagnosis not present

## 2012-12-19 DIAGNOSIS — I10 Essential (primary) hypertension: Secondary | ICD-10-CM | POA: Diagnosis not present

## 2012-12-19 DIAGNOSIS — N184 Chronic kidney disease, stage 4 (severe): Secondary | ICD-10-CM | POA: Diagnosis not present

## 2012-12-20 ENCOUNTER — Ambulatory Visit: Payer: Self-pay | Admitting: Internal Medicine

## 2012-12-20 DIAGNOSIS — Z170 Estrogen receptor positive status [ER+]: Secondary | ICD-10-CM | POA: Diagnosis not present

## 2012-12-20 DIAGNOSIS — C50919 Malignant neoplasm of unspecified site of unspecified female breast: Secondary | ICD-10-CM | POA: Diagnosis not present

## 2012-12-20 DIAGNOSIS — N189 Chronic kidney disease, unspecified: Secondary | ICD-10-CM | POA: Diagnosis not present

## 2012-12-20 DIAGNOSIS — D638 Anemia in other chronic diseases classified elsewhere: Secondary | ICD-10-CM | POA: Diagnosis not present

## 2012-12-20 LAB — CBC CANCER CENTER
Basophil #: 0.1 "x10 3/mm "
Basophil %: 0.9 %
Eosinophil #: 0.2 "x10 3/mm "
Eosinophil %: 2 %
HCT: 31.3 % — ABNORMAL LOW
HGB: 10.1 g/dL — ABNORMAL LOW
Lymphocyte %: 25.5 %
Lymphs Abs: 2.1 "x10 3/mm "
MCH: 30.1 pg
MCHC: 32.3 g/dL
MCV: 93 fL
Monocyte #: 0.7 "x10 3/mm "
Monocyte %: 9.2 %
Neutrophil #: 5.1 "x10 3/mm "
Neutrophil %: 62.4 %
Platelet: 217 "x10 3/mm "
RBC: 3.36 "x10 6/mm " — ABNORMAL LOW
RDW: 14.8 % — ABNORMAL HIGH
WBC: 8.2 "x10 3/mm "

## 2012-12-23 ENCOUNTER — Ambulatory Visit: Payer: Self-pay | Admitting: Unknown Physician Specialty

## 2012-12-23 DIAGNOSIS — N949 Unspecified condition associated with female genital organs and menstrual cycle: Secondary | ICD-10-CM | POA: Diagnosis not present

## 2012-12-23 DIAGNOSIS — S32409A Unspecified fracture of unspecified acetabulum, initial encounter for closed fracture: Secondary | ICD-10-CM | POA: Diagnosis not present

## 2012-12-25 DIAGNOSIS — N184 Chronic kidney disease, stage 4 (severe): Secondary | ICD-10-CM | POA: Diagnosis not present

## 2012-12-25 DIAGNOSIS — I701 Atherosclerosis of renal artery: Secondary | ICD-10-CM | POA: Diagnosis not present

## 2012-12-25 DIAGNOSIS — I10 Essential (primary) hypertension: Secondary | ICD-10-CM | POA: Diagnosis not present

## 2012-12-25 DIAGNOSIS — E119 Type 2 diabetes mellitus without complications: Secondary | ICD-10-CM | POA: Diagnosis not present

## 2012-12-31 ENCOUNTER — Ambulatory Visit: Payer: Self-pay | Admitting: Internal Medicine

## 2012-12-31 DIAGNOSIS — M8448XA Pathological fracture, other site, initial encounter for fracture: Secondary | ICD-10-CM | POA: Diagnosis not present

## 2013-01-10 ENCOUNTER — Ambulatory Visit: Payer: Self-pay | Admitting: Internal Medicine

## 2013-01-10 DIAGNOSIS — C50919 Malignant neoplasm of unspecified site of unspecified female breast: Secondary | ICD-10-CM | POA: Diagnosis not present

## 2013-01-10 DIAGNOSIS — Z170 Estrogen receptor positive status [ER+]: Secondary | ICD-10-CM | POA: Diagnosis not present

## 2013-01-10 DIAGNOSIS — Z9010 Acquired absence of unspecified breast and nipple: Secondary | ICD-10-CM | POA: Diagnosis not present

## 2013-01-10 DIAGNOSIS — Z7982 Long term (current) use of aspirin: Secondary | ICD-10-CM | POA: Diagnosis not present

## 2013-01-10 DIAGNOSIS — I10 Essential (primary) hypertension: Secondary | ICD-10-CM | POA: Diagnosis not present

## 2013-01-10 DIAGNOSIS — E119 Type 2 diabetes mellitus without complications: Secondary | ICD-10-CM | POA: Diagnosis not present

## 2013-01-10 DIAGNOSIS — Z79899 Other long term (current) drug therapy: Secondary | ICD-10-CM | POA: Diagnosis not present

## 2013-01-10 DIAGNOSIS — D638 Anemia in other chronic diseases classified elsewhere: Secondary | ICD-10-CM | POA: Diagnosis not present

## 2013-01-10 DIAGNOSIS — E039 Hypothyroidism, unspecified: Secondary | ICD-10-CM | POA: Diagnosis not present

## 2013-01-10 DIAGNOSIS — I2510 Atherosclerotic heart disease of native coronary artery without angina pectoris: Secondary | ICD-10-CM | POA: Diagnosis not present

## 2013-01-10 DIAGNOSIS — M25559 Pain in unspecified hip: Secondary | ICD-10-CM | POA: Diagnosis not present

## 2013-01-10 DIAGNOSIS — N189 Chronic kidney disease, unspecified: Secondary | ICD-10-CM | POA: Diagnosis not present

## 2013-01-10 LAB — CBC CANCER CENTER
Basophil #: 0.1 x10 3/mm (ref 0.0–0.1)
Basophil %: 0.9 %
Eosinophil %: 1.5 %
HGB: 10.5 g/dL — ABNORMAL LOW (ref 12.0–16.0)
Lymphocyte #: 2.4 x10 3/mm (ref 1.0–3.6)
Lymphocyte %: 31.2 %
MCH: 30.8 pg (ref 26.0–34.0)
MCHC: 33.3 g/dL (ref 32.0–36.0)
Monocyte %: 9.7 %
Neutrophil #: 4.3 x10 3/mm (ref 1.4–6.5)
Neutrophil %: 56.7 %
Platelet: 222 x10 3/mm (ref 150–440)
WBC: 7.6 x10 3/mm (ref 3.6–11.0)

## 2013-01-10 LAB — COMPREHENSIVE METABOLIC PANEL
Alkaline Phosphatase: 104 U/L (ref 50–136)
Bilirubin,Total: 0.4 mg/dL (ref 0.2–1.0)
Co2: 29 mmol/L (ref 21–32)
Glucose: 153 mg/dL — ABNORMAL HIGH (ref 65–99)

## 2013-01-11 ENCOUNTER — Observation Stay: Payer: Self-pay | Admitting: Internal Medicine

## 2013-01-11 DIAGNOSIS — N289 Disorder of kidney and ureter, unspecified: Secondary | ICD-10-CM | POA: Diagnosis not present

## 2013-01-11 DIAGNOSIS — C50919 Malignant neoplasm of unspecified site of unspecified female breast: Secondary | ICD-10-CM | POA: Diagnosis not present

## 2013-01-11 DIAGNOSIS — I129 Hypertensive chronic kidney disease with stage 1 through stage 4 chronic kidney disease, or unspecified chronic kidney disease: Secondary | ICD-10-CM | POA: Diagnosis not present

## 2013-01-11 DIAGNOSIS — Z87891 Personal history of nicotine dependence: Secondary | ICD-10-CM | POA: Diagnosis not present

## 2013-01-11 DIAGNOSIS — Z170 Estrogen receptor positive status [ER+]: Secondary | ICD-10-CM | POA: Diagnosis not present

## 2013-01-11 DIAGNOSIS — M109 Gout, unspecified: Secondary | ICD-10-CM | POA: Diagnosis not present

## 2013-01-11 DIAGNOSIS — Z882 Allergy status to sulfonamides status: Secondary | ICD-10-CM | POA: Diagnosis not present

## 2013-01-11 DIAGNOSIS — N039 Chronic nephritic syndrome with unspecified morphologic changes: Secondary | ICD-10-CM | POA: Diagnosis not present

## 2013-01-11 DIAGNOSIS — Z9849 Cataract extraction status, unspecified eye: Secondary | ICD-10-CM | POA: Diagnosis not present

## 2013-01-11 DIAGNOSIS — E119 Type 2 diabetes mellitus without complications: Secondary | ICD-10-CM | POA: Diagnosis not present

## 2013-01-11 DIAGNOSIS — Z85828 Personal history of other malignant neoplasm of skin: Secondary | ICD-10-CM | POA: Diagnosis not present

## 2013-01-11 DIAGNOSIS — E860 Dehydration: Secondary | ICD-10-CM | POA: Diagnosis not present

## 2013-01-11 DIAGNOSIS — R42 Dizziness and giddiness: Secondary | ICD-10-CM | POA: Diagnosis not present

## 2013-01-11 DIAGNOSIS — R55 Syncope and collapse: Secondary | ICD-10-CM | POA: Diagnosis not present

## 2013-01-11 DIAGNOSIS — H409 Unspecified glaucoma: Secondary | ICD-10-CM | POA: Diagnosis not present

## 2013-01-11 DIAGNOSIS — E785 Hyperlipidemia, unspecified: Secondary | ICD-10-CM | POA: Diagnosis not present

## 2013-01-11 DIAGNOSIS — I959 Hypotension, unspecified: Secondary | ICD-10-CM | POA: Diagnosis not present

## 2013-01-11 DIAGNOSIS — I10 Essential (primary) hypertension: Secondary | ICD-10-CM | POA: Diagnosis not present

## 2013-01-11 DIAGNOSIS — K5289 Other specified noninfective gastroenteritis and colitis: Secondary | ICD-10-CM | POA: Diagnosis not present

## 2013-01-11 DIAGNOSIS — R32 Unspecified urinary incontinence: Secondary | ICD-10-CM | POA: Diagnosis not present

## 2013-01-11 DIAGNOSIS — G459 Transient cerebral ischemic attack, unspecified: Secondary | ICD-10-CM | POA: Diagnosis not present

## 2013-01-11 DIAGNOSIS — Z79899 Other long term (current) drug therapy: Secondary | ICD-10-CM | POA: Diagnosis not present

## 2013-01-11 DIAGNOSIS — I2510 Atherosclerotic heart disease of native coronary artery without angina pectoris: Secondary | ICD-10-CM | POA: Diagnosis not present

## 2013-01-11 DIAGNOSIS — I499 Cardiac arrhythmia, unspecified: Secondary | ICD-10-CM | POA: Diagnosis not present

## 2013-01-11 DIAGNOSIS — E039 Hypothyroidism, unspecified: Secondary | ICD-10-CM | POA: Diagnosis not present

## 2013-01-11 DIAGNOSIS — H539 Unspecified visual disturbance: Secondary | ICD-10-CM | POA: Diagnosis not present

## 2013-01-11 DIAGNOSIS — Z9010 Acquired absence of unspecified breast and nipple: Secondary | ICD-10-CM | POA: Diagnosis not present

## 2013-01-11 DIAGNOSIS — Z7982 Long term (current) use of aspirin: Secondary | ICD-10-CM | POA: Diagnosis not present

## 2013-01-11 DIAGNOSIS — E875 Hyperkalemia: Secondary | ICD-10-CM | POA: Diagnosis not present

## 2013-01-11 DIAGNOSIS — N189 Chronic kidney disease, unspecified: Secondary | ICD-10-CM | POA: Diagnosis not present

## 2013-01-11 LAB — CBC
HCT: 31.6 % — ABNORMAL LOW (ref 35.0–47.0)
MCHC: 31.4 g/dL — ABNORMAL LOW (ref 32.0–36.0)
WBC: 8.2 10*3/uL (ref 3.6–11.0)

## 2013-01-11 LAB — TROPONIN I: Troponin-I: <0.02 ng/mL

## 2013-01-11 LAB — URINALYSIS, COMPLETE
Bacteria: NONE SEEN
Bilirubin,UR: NEGATIVE
Blood: NEGATIVE
Glucose,UR: NEGATIVE mg/dL (ref 0–75)
Ketone: NEGATIVE
Ph: 5 (ref 4.5–8.0)
Protein: NEGATIVE
RBC,UR: NONE SEEN /HPF (ref 0–5)
Specific Gravity: 1.009 (ref 1.003–1.030)
Squamous Epithelial: <1
WBC UR: <1 /HPF (ref 0–5)

## 2013-01-11 LAB — COMPREHENSIVE METABOLIC PANEL
Albumin: 3.5 g/dL (ref 3.4–5.0)
Anion Gap: 8 (ref 7–16)
Bilirubin,Total: 0.4 mg/dL (ref 0.2–1.0)
Chloride: 104 mmol/L (ref 98–107)
Co2: 26 mmol/L (ref 21–32)
EGFR (African American): 21 — ABNORMAL LOW
EGFR (Non-African Amer.): 18 — ABNORMAL LOW
Glucose: 124 mg/dL — ABNORMAL HIGH (ref 65–99)
Osmolality: 287 (ref 275–301)
Total Protein: 7.5 g/dL (ref 6.4–8.2)

## 2013-01-11 LAB — CK TOTAL AND CKMB (NOT AT ARMC): CK, Total: 58 U/L (ref 21–215)

## 2013-01-12 DIAGNOSIS — I2510 Atherosclerotic heart disease of native coronary artery without angina pectoris: Secondary | ICD-10-CM | POA: Diagnosis not present

## 2013-01-12 DIAGNOSIS — I10 Essential (primary) hypertension: Secondary | ICD-10-CM | POA: Diagnosis not present

## 2013-01-12 DIAGNOSIS — E119 Type 2 diabetes mellitus without complications: Secondary | ICD-10-CM | POA: Diagnosis not present

## 2013-01-12 DIAGNOSIS — I658 Occlusion and stenosis of other precerebral arteries: Secondary | ICD-10-CM | POA: Diagnosis not present

## 2013-01-12 DIAGNOSIS — R55 Syncope and collapse: Secondary | ICD-10-CM | POA: Diagnosis not present

## 2013-01-12 LAB — CBC WITH DIFFERENTIAL/PLATELET
Basophil #: 0.1 10*3/uL (ref 0.0–0.1)
Basophil %: 1 %
Eosinophil #: 0.2 10*3/uL (ref 0.0–0.7)
Eosinophil %: 2.5 %
HCT: 31 % — ABNORMAL LOW (ref 35.0–47.0)
Lymphocyte #: 2 10*3/uL (ref 1.0–3.6)
Lymphocyte %: 26 %
MCHC: 32.7 g/dL (ref 32.0–36.0)
MCV: 93 fL (ref 80–100)
Monocyte #: 0.7 x10 3/mm (ref 0.2–0.9)
Monocyte %: 9.8 %
Neutrophil %: 60.7 %
Platelet: 201 10*3/uL (ref 150–440)
RBC: 3.34 10*6/uL — ABNORMAL LOW (ref 3.80–5.20)

## 2013-01-12 LAB — LIPID PANEL
Cholesterol: 155 mg/dL (ref 0–200)
Ldl Cholesterol, Calc: 88 mg/dL (ref 0–100)
Triglycerides: 116 mg/dL (ref 0–200)

## 2013-01-12 LAB — BASIC METABOLIC PANEL
BUN: 39 mg/dL — ABNORMAL HIGH (ref 7–18)
Calcium, Total: 9.6 mg/dL (ref 8.5–10.1)
Co2: 28 mmol/L (ref 21–32)
Creatinine: 2.07 mg/dL — ABNORMAL HIGH (ref 0.60–1.30)
EGFR (African American): 24 — ABNORMAL LOW
Osmolality: 287 (ref 275–301)
Potassium: 4.9 mmol/L (ref 3.5–5.1)
Sodium: 140 mmol/L (ref 136–145)

## 2013-01-12 LAB — MAGNESIUM: Magnesium: 1.8 mg/dL

## 2013-01-12 LAB — TSH: Thyroid Stimulating Horm: 5.61 u[IU]/mL — ABNORMAL HIGH

## 2013-01-12 LAB — T4, FREE: Free Thyroxine: 1.2 ng/dL (ref 0.76–1.46)

## 2013-01-15 DIAGNOSIS — R42 Dizziness and giddiness: Secondary | ICD-10-CM | POA: Diagnosis not present

## 2013-01-15 DIAGNOSIS — N184 Chronic kidney disease, stage 4 (severe): Secondary | ICD-10-CM | POA: Diagnosis not present

## 2013-01-15 DIAGNOSIS — I10 Essential (primary) hypertension: Secondary | ICD-10-CM | POA: Diagnosis not present

## 2013-01-16 DIAGNOSIS — R0609 Other forms of dyspnea: Secondary | ICD-10-CM | POA: Diagnosis not present

## 2013-01-16 DIAGNOSIS — R42 Dizziness and giddiness: Secondary | ICD-10-CM | POA: Diagnosis not present

## 2013-01-16 DIAGNOSIS — I1310 Hypertensive heart and chronic kidney disease without heart failure, with stage 1 through stage 4 chronic kidney disease, or unspecified chronic kidney disease: Secondary | ICD-10-CM | POA: Diagnosis not present

## 2013-01-16 DIAGNOSIS — R55 Syncope and collapse: Secondary | ICD-10-CM | POA: Diagnosis not present

## 2013-01-16 DIAGNOSIS — N184 Chronic kidney disease, stage 4 (severe): Secondary | ICD-10-CM | POA: Diagnosis not present

## 2013-01-20 DIAGNOSIS — E039 Hypothyroidism, unspecified: Secondary | ICD-10-CM | POA: Diagnosis not present

## 2013-01-20 DIAGNOSIS — Z79899 Other long term (current) drug therapy: Secondary | ICD-10-CM | POA: Diagnosis not present

## 2013-01-20 DIAGNOSIS — E7800 Pure hypercholesterolemia, unspecified: Secondary | ICD-10-CM | POA: Diagnosis not present

## 2013-01-20 DIAGNOSIS — E119 Type 2 diabetes mellitus without complications: Secondary | ICD-10-CM | POA: Diagnosis not present

## 2013-01-20 DIAGNOSIS — I4891 Unspecified atrial fibrillation: Secondary | ICD-10-CM | POA: Diagnosis not present

## 2013-01-20 DIAGNOSIS — I10 Essential (primary) hypertension: Secondary | ICD-10-CM | POA: Diagnosis not present

## 2013-01-20 DIAGNOSIS — E1129 Type 2 diabetes mellitus with other diabetic kidney complication: Secondary | ICD-10-CM | POA: Diagnosis not present

## 2013-01-23 DIAGNOSIS — I119 Hypertensive heart disease without heart failure: Secondary | ICD-10-CM | POA: Diagnosis not present

## 2013-01-23 DIAGNOSIS — I4949 Other premature depolarization: Secondary | ICD-10-CM | POA: Diagnosis not present

## 2013-01-23 DIAGNOSIS — E782 Mixed hyperlipidemia: Secondary | ICD-10-CM | POA: Diagnosis not present

## 2013-01-23 DIAGNOSIS — R42 Dizziness and giddiness: Secondary | ICD-10-CM | POA: Diagnosis not present

## 2013-01-31 ENCOUNTER — Ambulatory Visit: Payer: Self-pay | Admitting: Oncology

## 2013-01-31 ENCOUNTER — Ambulatory Visit: Payer: Self-pay | Admitting: Internal Medicine

## 2013-01-31 DIAGNOSIS — Z170 Estrogen receptor positive status [ER+]: Secondary | ICD-10-CM | POA: Diagnosis not present

## 2013-01-31 DIAGNOSIS — D638 Anemia in other chronic diseases classified elsewhere: Secondary | ICD-10-CM | POA: Diagnosis not present

## 2013-01-31 DIAGNOSIS — C50919 Malignant neoplasm of unspecified site of unspecified female breast: Secondary | ICD-10-CM | POA: Diagnosis not present

## 2013-01-31 DIAGNOSIS — N189 Chronic kidney disease, unspecified: Secondary | ICD-10-CM | POA: Diagnosis not present

## 2013-02-03 DIAGNOSIS — M25559 Pain in unspecified hip: Secondary | ICD-10-CM | POA: Diagnosis not present

## 2013-02-03 DIAGNOSIS — M169 Osteoarthritis of hip, unspecified: Secondary | ICD-10-CM | POA: Diagnosis not present

## 2013-02-07 LAB — CBC CANCER CENTER
Basophil #: 0.1 x10 3/mm (ref 0.0–0.1)
HCT: 31.4 % — ABNORMAL LOW (ref 35.0–47.0)
HGB: 10.4 g/dL — ABNORMAL LOW (ref 12.0–16.0)
Lymphocyte #: 1.9 x10 3/mm (ref 1.0–3.6)
Lymphocyte %: 30.8 %
MCHC: 33 g/dL (ref 32.0–36.0)
Monocyte %: 12.6 %
Neutrophil #: 3.2 x10 3/mm (ref 1.4–6.5)
RBC: 3.38 10*6/uL — ABNORMAL LOW (ref 3.80–5.20)
RDW: 15.5 % — ABNORMAL HIGH (ref 11.5–14.5)

## 2013-02-18 DIAGNOSIS — I495 Sick sinus syndrome: Secondary | ICD-10-CM | POA: Diagnosis not present

## 2013-02-18 DIAGNOSIS — I059 Rheumatic mitral valve disease, unspecified: Secondary | ICD-10-CM | POA: Diagnosis not present

## 2013-02-18 DIAGNOSIS — I119 Hypertensive heart disease without heart failure: Secondary | ICD-10-CM | POA: Diagnosis not present

## 2013-02-26 DIAGNOSIS — N184 Chronic kidney disease, stage 4 (severe): Secondary | ICD-10-CM | POA: Diagnosis not present

## 2013-02-26 DIAGNOSIS — I10 Essential (primary) hypertension: Secondary | ICD-10-CM | POA: Diagnosis not present

## 2013-02-26 DIAGNOSIS — D631 Anemia in chronic kidney disease: Secondary | ICD-10-CM | POA: Diagnosis not present

## 2013-02-26 DIAGNOSIS — N2581 Secondary hyperparathyroidism of renal origin: Secondary | ICD-10-CM | POA: Diagnosis not present

## 2013-02-27 DIAGNOSIS — M8448XA Pathological fracture, other site, initial encounter for fracture: Secondary | ICD-10-CM | POA: Diagnosis not present

## 2013-03-03 ENCOUNTER — Ambulatory Visit: Payer: Self-pay | Admitting: Internal Medicine

## 2013-03-03 DIAGNOSIS — N189 Chronic kidney disease, unspecified: Secondary | ICD-10-CM | POA: Diagnosis not present

## 2013-03-03 DIAGNOSIS — C50919 Malignant neoplasm of unspecified site of unspecified female breast: Secondary | ICD-10-CM | POA: Diagnosis not present

## 2013-03-03 DIAGNOSIS — Z170 Estrogen receptor positive status [ER+]: Secondary | ICD-10-CM | POA: Diagnosis not present

## 2013-03-03 DIAGNOSIS — D638 Anemia in other chronic diseases classified elsewhere: Secondary | ICD-10-CM | POA: Diagnosis not present

## 2013-03-04 DIAGNOSIS — E119 Type 2 diabetes mellitus without complications: Secondary | ICD-10-CM | POA: Diagnosis not present

## 2013-03-04 DIAGNOSIS — I70219 Atherosclerosis of native arteries of extremities with intermittent claudication, unspecified extremity: Secondary | ICD-10-CM | POA: Diagnosis not present

## 2013-03-04 DIAGNOSIS — I8310 Varicose veins of unspecified lower extremity with inflammation: Secondary | ICD-10-CM | POA: Diagnosis not present

## 2013-03-04 DIAGNOSIS — I701 Atherosclerosis of renal artery: Secondary | ICD-10-CM | POA: Diagnosis not present

## 2013-03-14 LAB — CBC CANCER CENTER
Basophil #: 0 x10 3/mm (ref 0.0–0.1)
Eosinophil #: 0.3 x10 3/mm (ref 0.0–0.7)
Eosinophil %: 3.3 %
Lymphocyte #: 2.4 x10 3/mm (ref 1.0–3.6)
Lymphocyte %: 29.4 %
MCH: 31.1 pg (ref 26.0–34.0)
MCHC: 33.3 g/dL (ref 32.0–36.0)
MCV: 94 fL (ref 80–100)
Neutrophil #: 4.7 x10 3/mm (ref 1.4–6.5)
Platelet: 180 x10 3/mm (ref 150–440)
RBC: 3.23 10*6/uL — ABNORMAL LOW (ref 3.80–5.20)
WBC: 8.2 x10 3/mm (ref 3.6–11.0)

## 2013-04-02 ENCOUNTER — Ambulatory Visit: Payer: Self-pay | Admitting: Internal Medicine

## 2013-04-02 ENCOUNTER — Ambulatory Visit: Payer: Self-pay | Admitting: Oncology

## 2013-04-02 DIAGNOSIS — Z23 Encounter for immunization: Secondary | ICD-10-CM | POA: Diagnosis not present

## 2013-04-02 DIAGNOSIS — Z170 Estrogen receptor positive status [ER+]: Secondary | ICD-10-CM | POA: Diagnosis not present

## 2013-04-02 DIAGNOSIS — D638 Anemia in other chronic diseases classified elsewhere: Secondary | ICD-10-CM | POA: Diagnosis not present

## 2013-04-02 DIAGNOSIS — N189 Chronic kidney disease, unspecified: Secondary | ICD-10-CM | POA: Diagnosis not present

## 2013-04-02 DIAGNOSIS — C50919 Malignant neoplasm of unspecified site of unspecified female breast: Secondary | ICD-10-CM | POA: Diagnosis not present

## 2013-04-02 DIAGNOSIS — Z79899 Other long term (current) drug therapy: Secondary | ICD-10-CM | POA: Diagnosis not present

## 2013-04-11 LAB — CBC CANCER CENTER
Basophil %: 0.8 %
Eosinophil #: 0.2 x10 3/mm (ref 0.0–0.7)
Eosinophil %: 2.1 %
HCT: 31.5 % — ABNORMAL LOW (ref 35.0–47.0)
Lymphocyte #: 2.8 x10 3/mm (ref 1.0–3.6)
Lymphocyte %: 34.5 %
MCV: 95 fL (ref 80–100)
Neutrophil %: 51.4 %
Platelet: 183 x10 3/mm (ref 150–440)
RBC: 3.32 10*6/uL — ABNORMAL LOW (ref 3.80–5.20)
RDW: 15.4 % — ABNORMAL HIGH (ref 11.5–14.5)
WBC: 8 x10 3/mm (ref 3.6–11.0)

## 2013-04-21 DIAGNOSIS — R7301 Impaired fasting glucose: Secondary | ICD-10-CM | POA: Diagnosis not present

## 2013-05-02 DIAGNOSIS — L821 Other seborrheic keratosis: Secondary | ICD-10-CM | POA: Diagnosis not present

## 2013-05-02 DIAGNOSIS — Z85828 Personal history of other malignant neoplasm of skin: Secondary | ICD-10-CM | POA: Diagnosis not present

## 2013-05-02 DIAGNOSIS — L723 Sebaceous cyst: Secondary | ICD-10-CM | POA: Diagnosis not present

## 2013-05-02 DIAGNOSIS — L570 Actinic keratosis: Secondary | ICD-10-CM | POA: Diagnosis not present

## 2013-05-03 ENCOUNTER — Ambulatory Visit: Payer: Self-pay | Admitting: Internal Medicine

## 2013-05-03 DIAGNOSIS — E039 Hypothyroidism, unspecified: Secondary | ICD-10-CM | POA: Diagnosis not present

## 2013-05-03 DIAGNOSIS — I10 Essential (primary) hypertension: Secondary | ICD-10-CM | POA: Diagnosis not present

## 2013-05-03 DIAGNOSIS — E119 Type 2 diabetes mellitus without complications: Secondary | ICD-10-CM | POA: Diagnosis not present

## 2013-05-03 DIAGNOSIS — M109 Gout, unspecified: Secondary | ICD-10-CM | POA: Diagnosis not present

## 2013-05-03 DIAGNOSIS — D638 Anemia in other chronic diseases classified elsewhere: Secondary | ICD-10-CM | POA: Diagnosis not present

## 2013-05-03 DIAGNOSIS — Z7982 Long term (current) use of aspirin: Secondary | ICD-10-CM | POA: Diagnosis not present

## 2013-05-03 DIAGNOSIS — N189 Chronic kidney disease, unspecified: Secondary | ICD-10-CM | POA: Diagnosis not present

## 2013-05-03 DIAGNOSIS — Z79899 Other long term (current) drug therapy: Secondary | ICD-10-CM | POA: Diagnosis not present

## 2013-05-03 DIAGNOSIS — I209 Angina pectoris, unspecified: Secondary | ICD-10-CM | POA: Diagnosis not present

## 2013-05-03 DIAGNOSIS — E7800 Pure hypercholesterolemia, unspecified: Secondary | ICD-10-CM | POA: Diagnosis not present

## 2013-05-03 DIAGNOSIS — C50919 Malignant neoplasm of unspecified site of unspecified female breast: Secondary | ICD-10-CM | POA: Diagnosis not present

## 2013-05-03 DIAGNOSIS — Z9010 Acquired absence of unspecified breast and nipple: Secondary | ICD-10-CM | POA: Diagnosis not present

## 2013-05-03 DIAGNOSIS — Z170 Estrogen receptor positive status [ER+]: Secondary | ICD-10-CM | POA: Diagnosis not present

## 2013-05-05 DIAGNOSIS — D631 Anemia in chronic kidney disease: Secondary | ICD-10-CM | POA: Diagnosis not present

## 2013-05-05 DIAGNOSIS — I10 Essential (primary) hypertension: Secondary | ICD-10-CM | POA: Diagnosis not present

## 2013-05-05 DIAGNOSIS — N184 Chronic kidney disease, stage 4 (severe): Secondary | ICD-10-CM | POA: Diagnosis not present

## 2013-05-05 DIAGNOSIS — N2581 Secondary hyperparathyroidism of renal origin: Secondary | ICD-10-CM | POA: Diagnosis not present

## 2013-05-05 DIAGNOSIS — R809 Proteinuria, unspecified: Secondary | ICD-10-CM | POA: Diagnosis not present

## 2013-05-09 DIAGNOSIS — D638 Anemia in other chronic diseases classified elsewhere: Secondary | ICD-10-CM | POA: Diagnosis not present

## 2013-05-09 DIAGNOSIS — Z170 Estrogen receptor positive status [ER+]: Secondary | ICD-10-CM | POA: Diagnosis not present

## 2013-05-09 DIAGNOSIS — N189 Chronic kidney disease, unspecified: Secondary | ICD-10-CM | POA: Diagnosis not present

## 2013-05-09 DIAGNOSIS — C50919 Malignant neoplasm of unspecified site of unspecified female breast: Secondary | ICD-10-CM | POA: Diagnosis not present

## 2013-05-09 LAB — CBC CANCER CENTER
Basophil #: 0.1 x10 3/mm (ref 0.0–0.1)
Basophil %: 0.7 %
Eosinophil #: 0.3 x10 3/mm (ref 0.0–0.7)
Eosinophil %: 2.9 %
Lymphocyte #: 3.4 x10 3/mm (ref 1.0–3.6)
Lymphocyte %: 38.6 %
MCH: 31.2 pg (ref 26.0–34.0)
MCHC: 32.3 g/dL (ref 32.0–36.0)
MCV: 96 fL (ref 80–100)
Monocyte #: 0.9 x10 3/mm (ref 0.2–0.9)
Monocyte %: 9.8 %
Neutrophil #: 4.2 x10 3/mm (ref 1.4–6.5)
Neutrophil %: 48 %
Platelet: 217 x10 3/mm (ref 150–440)
RBC: 3.31 10*6/uL — ABNORMAL LOW (ref 3.80–5.20)
RDW: 15.3 % — ABNORMAL HIGH (ref 11.5–14.5)

## 2013-05-09 LAB — COMPREHENSIVE METABOLIC PANEL
Alkaline Phosphatase: 87 U/L (ref 50–136)
Anion Gap: 10 (ref 7–16)
BUN: 44 mg/dL — ABNORMAL HIGH (ref 7–18)
Bilirubin,Total: 0.4 mg/dL (ref 0.2–1.0)
Chloride: 104 mmol/L (ref 98–107)
Co2: 26 mmol/L (ref 21–32)
Creatinine: 2.17 mg/dL — ABNORMAL HIGH (ref 0.60–1.30)
EGFR (African American): 23 — ABNORMAL LOW
Potassium: 4.8 mmol/L (ref 3.5–5.1)
SGOT(AST): 17 U/L (ref 15–37)
Sodium: 140 mmol/L (ref 136–145)

## 2013-06-02 ENCOUNTER — Ambulatory Visit: Payer: Self-pay | Admitting: Internal Medicine

## 2013-06-06 ENCOUNTER — Ambulatory Visit: Payer: Self-pay | Admitting: Internal Medicine

## 2013-06-06 DIAGNOSIS — D638 Anemia in other chronic diseases classified elsewhere: Secondary | ICD-10-CM | POA: Diagnosis not present

## 2013-06-06 DIAGNOSIS — C50919 Malignant neoplasm of unspecified site of unspecified female breast: Secondary | ICD-10-CM | POA: Diagnosis not present

## 2013-06-06 DIAGNOSIS — N189 Chronic kidney disease, unspecified: Secondary | ICD-10-CM | POA: Diagnosis not present

## 2013-06-06 DIAGNOSIS — Z170 Estrogen receptor positive status [ER+]: Secondary | ICD-10-CM | POA: Diagnosis not present

## 2013-06-24 DIAGNOSIS — I10 Essential (primary) hypertension: Secondary | ICD-10-CM | POA: Diagnosis not present

## 2013-06-24 DIAGNOSIS — I2510 Atherosclerotic heart disease of native coronary artery without angina pectoris: Secondary | ICD-10-CM | POA: Diagnosis not present

## 2013-06-24 DIAGNOSIS — I059 Rheumatic mitral valve disease, unspecified: Secondary | ICD-10-CM | POA: Diagnosis not present

## 2013-06-24 DIAGNOSIS — E785 Hyperlipidemia, unspecified: Secondary | ICD-10-CM | POA: Diagnosis not present

## 2013-06-28 ENCOUNTER — Emergency Department: Payer: Self-pay | Admitting: Emergency Medicine

## 2013-06-28 DIAGNOSIS — M109 Gout, unspecified: Secondary | ICD-10-CM | POA: Diagnosis not present

## 2013-06-28 DIAGNOSIS — T07XXXA Unspecified multiple injuries, initial encounter: Secondary | ICD-10-CM | POA: Diagnosis not present

## 2013-06-28 DIAGNOSIS — I120 Hypertensive chronic kidney disease with stage 5 chronic kidney disease or end stage renal disease: Secondary | ICD-10-CM | POA: Diagnosis not present

## 2013-06-28 DIAGNOSIS — IMO0002 Reserved for concepts with insufficient information to code with codable children: Secondary | ICD-10-CM | POA: Diagnosis not present

## 2013-06-28 DIAGNOSIS — R9431 Abnormal electrocardiogram [ECG] [EKG]: Secondary | ICD-10-CM | POA: Diagnosis not present

## 2013-06-28 DIAGNOSIS — S0993XA Unspecified injury of face, initial encounter: Secondary | ICD-10-CM | POA: Diagnosis not present

## 2013-06-28 DIAGNOSIS — I2510 Atherosclerotic heart disease of native coronary artery without angina pectoris: Secondary | ICD-10-CM | POA: Diagnosis not present

## 2013-06-28 DIAGNOSIS — Z79899 Other long term (current) drug therapy: Secondary | ICD-10-CM | POA: Diagnosis not present

## 2013-06-28 DIAGNOSIS — N390 Urinary tract infection, site not specified: Secondary | ICD-10-CM | POA: Diagnosis not present

## 2013-06-28 DIAGNOSIS — S0990XA Unspecified injury of head, initial encounter: Secondary | ICD-10-CM | POA: Diagnosis not present

## 2013-06-28 DIAGNOSIS — E119 Type 2 diabetes mellitus without complications: Secondary | ICD-10-CM | POA: Diagnosis not present

## 2013-06-28 DIAGNOSIS — Z7982 Long term (current) use of aspirin: Secondary | ICD-10-CM | POA: Diagnosis not present

## 2013-06-28 LAB — CBC WITH DIFFERENTIAL/PLATELET
Basophil #: 0.1 10*3/uL (ref 0.0–0.1)
Basophil %: 0.9 %
Eosinophil #: 0.1 10*3/uL (ref 0.0–0.7)
HCT: 33 % — ABNORMAL LOW (ref 35.0–47.0)
HGB: 10.7 g/dL — ABNORMAL LOW (ref 12.0–16.0)
MCH: 30.4 pg (ref 26.0–34.0)
Monocyte #: 0.6 x10 3/mm (ref 0.2–0.9)
Monocyte %: 7.9 %
Neutrophil #: 5.2 10*3/uL (ref 1.4–6.5)
Neutrophil %: 67.1 %
RBC: 3.53 10*6/uL — ABNORMAL LOW (ref 3.80–5.20)
WBC: 7.7 10*3/uL (ref 3.6–11.0)

## 2013-06-28 LAB — BASIC METABOLIC PANEL
Anion Gap: 5 — ABNORMAL LOW (ref 7–16)
BUN: 33 mg/dL — ABNORMAL HIGH (ref 7–18)
Calcium, Total: 9.9 mg/dL (ref 8.5–10.1)
Chloride: 106 mmol/L (ref 98–107)
Co2: 28 mmol/L (ref 21–32)
Creatinine: 2.23 mg/dL — ABNORMAL HIGH (ref 0.60–1.30)
EGFR (Non-African Amer.): 19 — ABNORMAL LOW
Glucose: 69 mg/dL (ref 65–99)
Osmolality: 283 (ref 275–301)
Sodium: 139 mmol/L (ref 136–145)

## 2013-06-28 LAB — URINALYSIS, COMPLETE
Bilirubin,UR: NEGATIVE
Glucose,UR: NEGATIVE mg/dL (ref 0–75)
Ph: 7 (ref 4.5–8.0)
RBC,UR: 4 /HPF (ref 0–5)
Specific Gravity: 1.005 (ref 1.003–1.030)
Squamous Epithelial: 2

## 2013-06-28 LAB — TROPONIN I: Troponin-I: <0.02 ng/mL

## 2013-06-30 DIAGNOSIS — S0003XA Contusion of scalp, initial encounter: Secondary | ICD-10-CM | POA: Diagnosis not present

## 2013-07-03 ENCOUNTER — Ambulatory Visit: Payer: Self-pay | Admitting: Family Medicine

## 2013-07-03 ENCOUNTER — Ambulatory Visit: Payer: Self-pay | Admitting: Internal Medicine

## 2013-07-03 DIAGNOSIS — C50919 Malignant neoplasm of unspecified site of unspecified female breast: Secondary | ICD-10-CM | POA: Diagnosis not present

## 2013-07-03 DIAGNOSIS — Z170 Estrogen receptor positive status [ER+]: Secondary | ICD-10-CM | POA: Diagnosis not present

## 2013-07-03 DIAGNOSIS — D638 Anemia in other chronic diseases classified elsewhere: Secondary | ICD-10-CM | POA: Diagnosis not present

## 2013-07-03 DIAGNOSIS — N189 Chronic kidney disease, unspecified: Secondary | ICD-10-CM | POA: Diagnosis not present

## 2013-07-04 LAB — CANCER CENTER HEMOGLOBIN: HGB: 10.2 g/dL — ABNORMAL LOW (ref 12.0–16.0)

## 2013-07-22 DIAGNOSIS — I10 Essential (primary) hypertension: Secondary | ICD-10-CM | POA: Diagnosis not present

## 2013-07-22 DIAGNOSIS — E119 Type 2 diabetes mellitus without complications: Secondary | ICD-10-CM | POA: Diagnosis not present

## 2013-07-22 DIAGNOSIS — E7800 Pure hypercholesterolemia, unspecified: Secondary | ICD-10-CM | POA: Diagnosis not present

## 2013-07-22 DIAGNOSIS — E1159 Type 2 diabetes mellitus with other circulatory complications: Secondary | ICD-10-CM | POA: Diagnosis not present

## 2013-07-22 DIAGNOSIS — Z79899 Other long term (current) drug therapy: Secondary | ICD-10-CM | POA: Diagnosis not present

## 2013-07-28 DIAGNOSIS — D631 Anemia in chronic kidney disease: Secondary | ICD-10-CM | POA: Diagnosis not present

## 2013-07-28 DIAGNOSIS — N184 Chronic kidney disease, stage 4 (severe): Secondary | ICD-10-CM | POA: Diagnosis not present

## 2013-07-28 DIAGNOSIS — N039 Chronic nephritic syndrome with unspecified morphologic changes: Secondary | ICD-10-CM | POA: Diagnosis not present

## 2013-07-28 DIAGNOSIS — I10 Essential (primary) hypertension: Secondary | ICD-10-CM | POA: Diagnosis not present

## 2013-07-28 DIAGNOSIS — N2581 Secondary hyperparathyroidism of renal origin: Secondary | ICD-10-CM | POA: Diagnosis not present

## 2013-08-01 LAB — CANCER CENTER HEMOGLOBIN: HGB: 10.1 g/dL — ABNORMAL LOW (ref 12.0–16.0)

## 2013-08-03 ENCOUNTER — Ambulatory Visit: Payer: Self-pay | Admitting: Internal Medicine

## 2013-08-13 DIAGNOSIS — R509 Fever, unspecified: Secondary | ICD-10-CM | POA: Diagnosis not present

## 2013-08-13 DIAGNOSIS — J111 Influenza due to unidentified influenza virus with other respiratory manifestations: Secondary | ICD-10-CM | POA: Diagnosis not present

## 2013-08-15 DIAGNOSIS — J449 Chronic obstructive pulmonary disease, unspecified: Secondary | ICD-10-CM | POA: Diagnosis not present

## 2013-08-15 DIAGNOSIS — E162 Hypoglycemia, unspecified: Secondary | ICD-10-CM | POA: Diagnosis not present

## 2013-08-15 DIAGNOSIS — J9819 Other pulmonary collapse: Secondary | ICD-10-CM | POA: Diagnosis not present

## 2013-08-15 DIAGNOSIS — R05 Cough: Secondary | ICD-10-CM | POA: Diagnosis not present

## 2013-08-15 DIAGNOSIS — J111 Influenza due to unidentified influenza virus with other respiratory manifestations: Secondary | ICD-10-CM | POA: Diagnosis not present

## 2013-08-15 DIAGNOSIS — R059 Cough, unspecified: Secondary | ICD-10-CM | POA: Diagnosis not present

## 2013-08-27 DIAGNOSIS — J449 Chronic obstructive pulmonary disease, unspecified: Secondary | ICD-10-CM | POA: Diagnosis not present

## 2013-08-27 DIAGNOSIS — J189 Pneumonia, unspecified organism: Secondary | ICD-10-CM | POA: Diagnosis not present

## 2013-08-27 DIAGNOSIS — R0602 Shortness of breath: Secondary | ICD-10-CM | POA: Diagnosis not present

## 2013-09-01 DIAGNOSIS — I129 Hypertensive chronic kidney disease with stage 1 through stage 4 chronic kidney disease, or unspecified chronic kidney disease: Secondary | ICD-10-CM | POA: Diagnosis not present

## 2013-09-01 DIAGNOSIS — N189 Chronic kidney disease, unspecified: Secondary | ICD-10-CM | POA: Diagnosis not present

## 2013-09-01 DIAGNOSIS — I701 Atherosclerosis of renal artery: Secondary | ICD-10-CM | POA: Diagnosis not present

## 2013-09-01 DIAGNOSIS — E119 Type 2 diabetes mellitus without complications: Secondary | ICD-10-CM | POA: Diagnosis not present

## 2013-09-02 ENCOUNTER — Ambulatory Visit: Payer: Self-pay | Admitting: Oncology

## 2013-09-02 DIAGNOSIS — I2510 Atherosclerotic heart disease of native coronary artery without angina pectoris: Secondary | ICD-10-CM | POA: Diagnosis not present

## 2013-09-02 DIAGNOSIS — Z79899 Other long term (current) drug therapy: Secondary | ICD-10-CM | POA: Diagnosis not present

## 2013-09-02 DIAGNOSIS — E039 Hypothyroidism, unspecified: Secondary | ICD-10-CM | POA: Diagnosis not present

## 2013-09-02 DIAGNOSIS — K279 Peptic ulcer, site unspecified, unspecified as acute or chronic, without hemorrhage or perforation: Secondary | ICD-10-CM | POA: Diagnosis not present

## 2013-09-02 DIAGNOSIS — Z9010 Acquired absence of unspecified breast and nipple: Secondary | ICD-10-CM | POA: Diagnosis not present

## 2013-09-02 DIAGNOSIS — N19 Unspecified kidney failure: Secondary | ICD-10-CM | POA: Diagnosis not present

## 2013-09-02 DIAGNOSIS — J189 Pneumonia, unspecified organism: Secondary | ICD-10-CM | POA: Diagnosis not present

## 2013-09-02 DIAGNOSIS — E119 Type 2 diabetes mellitus without complications: Secondary | ICD-10-CM | POA: Diagnosis not present

## 2013-09-02 DIAGNOSIS — Z170 Estrogen receptor positive status [ER+]: Secondary | ICD-10-CM | POA: Diagnosis not present

## 2013-09-02 DIAGNOSIS — C50919 Malignant neoplasm of unspecified site of unspecified female breast: Secondary | ICD-10-CM | POA: Diagnosis not present

## 2013-09-02 DIAGNOSIS — Z7982 Long term (current) use of aspirin: Secondary | ICD-10-CM | POA: Diagnosis not present

## 2013-09-02 DIAGNOSIS — D649 Anemia, unspecified: Secondary | ICD-10-CM | POA: Diagnosis not present

## 2013-09-02 LAB — COMPREHENSIVE METABOLIC PANEL
ALBUMIN: 3.4 g/dL (ref 3.4–5.0)
ANION GAP: 8 (ref 7–16)
Alkaline Phosphatase: 74 U/L
BUN: 52 mg/dL — AB (ref 7–18)
Bilirubin,Total: 0.4 mg/dL (ref 0.2–1.0)
CHLORIDE: 102 mmol/L (ref 98–107)
CREATININE: 2.72 mg/dL — AB (ref 0.60–1.30)
Calcium, Total: 9 mg/dL (ref 8.5–10.1)
Co2: 28 mmol/L (ref 21–32)
EGFR (African American): 17 — ABNORMAL LOW
EGFR (Non-African Amer.): 15 — ABNORMAL LOW
GLUCOSE: 59 mg/dL — AB (ref 65–99)
Osmolality: 288 (ref 275–301)
POTASSIUM: 5.6 mmol/L — AB (ref 3.5–5.1)
SGOT(AST): 18 U/L (ref 15–37)
SGPT (ALT): 13 U/L (ref 12–78)
Sodium: 138 mmol/L (ref 136–145)
Total Protein: 7.4 g/dL (ref 6.4–8.2)

## 2013-09-02 LAB — CBC CANCER CENTER
BASOS PCT: 0.9 %
Basophil #: 0.1 x10 3/mm (ref 0.0–0.1)
EOS ABS: 0.1 x10 3/mm (ref 0.0–0.7)
Eosinophil %: 2 %
HCT: 32.8 % — ABNORMAL LOW (ref 35.0–47.0)
HGB: 10.3 g/dL — ABNORMAL LOW (ref 12.0–16.0)
LYMPHS ABS: 2.5 x10 3/mm (ref 1.0–3.6)
Lymphocyte %: 35.1 %
MCH: 29.5 pg (ref 26.0–34.0)
MCHC: 31.4 g/dL — ABNORMAL LOW (ref 32.0–36.0)
MCV: 94 fL (ref 80–100)
Monocyte #: 0.7 x10 3/mm (ref 0.2–0.9)
Monocyte %: 9.8 %
NEUTROS PCT: 52.2 %
Neutrophil #: 3.7 x10 3/mm (ref 1.4–6.5)
PLATELETS: 192 x10 3/mm (ref 150–440)
RBC: 3.49 10*6/uL — AB (ref 3.80–5.20)
RDW: 14.6 % — ABNORMAL HIGH (ref 11.5–14.5)
WBC: 7 x10 3/mm (ref 3.6–11.0)

## 2013-09-10 DIAGNOSIS — J189 Pneumonia, unspecified organism: Secondary | ICD-10-CM | POA: Diagnosis not present

## 2013-09-16 DIAGNOSIS — I6529 Occlusion and stenosis of unspecified carotid artery: Secondary | ICD-10-CM | POA: Diagnosis not present

## 2013-09-16 DIAGNOSIS — I2510 Atherosclerotic heart disease of native coronary artery without angina pectoris: Secondary | ICD-10-CM | POA: Diagnosis not present

## 2013-09-16 DIAGNOSIS — I059 Rheumatic mitral valve disease, unspecified: Secondary | ICD-10-CM | POA: Diagnosis not present

## 2013-09-16 DIAGNOSIS — R55 Syncope and collapse: Secondary | ICD-10-CM | POA: Diagnosis not present

## 2013-09-19 DIAGNOSIS — R55 Syncope and collapse: Secondary | ICD-10-CM | POA: Diagnosis not present

## 2013-09-29 DIAGNOSIS — R55 Syncope and collapse: Secondary | ICD-10-CM | POA: Diagnosis not present

## 2013-09-29 DIAGNOSIS — E782 Mixed hyperlipidemia: Secondary | ICD-10-CM | POA: Diagnosis not present

## 2013-09-29 DIAGNOSIS — I6529 Occlusion and stenosis of unspecified carotid artery: Secondary | ICD-10-CM | POA: Diagnosis not present

## 2013-09-29 DIAGNOSIS — I209 Angina pectoris, unspecified: Secondary | ICD-10-CM | POA: Diagnosis not present

## 2013-09-29 DIAGNOSIS — I059 Rheumatic mitral valve disease, unspecified: Secondary | ICD-10-CM | POA: Diagnosis not present

## 2013-10-01 ENCOUNTER — Ambulatory Visit: Payer: Self-pay | Admitting: Oncology

## 2013-10-03 ENCOUNTER — Ambulatory Visit: Payer: Self-pay | Admitting: Oncology

## 2013-10-03 DIAGNOSIS — Z79899 Other long term (current) drug therapy: Secondary | ICD-10-CM | POA: Diagnosis not present

## 2013-10-03 DIAGNOSIS — N19 Unspecified kidney failure: Secondary | ICD-10-CM | POA: Diagnosis not present

## 2013-10-03 DIAGNOSIS — D649 Anemia, unspecified: Secondary | ICD-10-CM | POA: Diagnosis not present

## 2013-10-03 DIAGNOSIS — C50919 Malignant neoplasm of unspecified site of unspecified female breast: Secondary | ICD-10-CM | POA: Diagnosis not present

## 2013-10-03 DIAGNOSIS — Z170 Estrogen receptor positive status [ER+]: Secondary | ICD-10-CM | POA: Diagnosis not present

## 2013-10-03 LAB — CANCER CENTER HEMOGLOBIN: HGB: 10.3 g/dL — AB (ref 12.0–16.0)

## 2013-10-16 DIAGNOSIS — E119 Type 2 diabetes mellitus without complications: Secondary | ICD-10-CM | POA: Diagnosis not present

## 2013-10-16 DIAGNOSIS — E782 Mixed hyperlipidemia: Secondary | ICD-10-CM | POA: Diagnosis not present

## 2013-10-16 DIAGNOSIS — R42 Dizziness and giddiness: Secondary | ICD-10-CM | POA: Diagnosis not present

## 2013-10-16 DIAGNOSIS — R0789 Other chest pain: Secondary | ICD-10-CM | POA: Diagnosis not present

## 2013-10-23 DIAGNOSIS — N184 Chronic kidney disease, stage 4 (severe): Secondary | ICD-10-CM | POA: Diagnosis not present

## 2013-10-23 DIAGNOSIS — D631 Anemia in chronic kidney disease: Secondary | ICD-10-CM | POA: Diagnosis not present

## 2013-10-23 DIAGNOSIS — N2581 Secondary hyperparathyroidism of renal origin: Secondary | ICD-10-CM | POA: Diagnosis not present

## 2013-10-23 DIAGNOSIS — E872 Acidosis, unspecified: Secondary | ICD-10-CM | POA: Diagnosis not present

## 2013-10-23 DIAGNOSIS — R079 Chest pain, unspecified: Secondary | ICD-10-CM | POA: Diagnosis not present

## 2013-10-23 DIAGNOSIS — I10 Essential (primary) hypertension: Secondary | ICD-10-CM | POA: Diagnosis not present

## 2013-10-24 ENCOUNTER — Emergency Department: Payer: Self-pay | Admitting: Emergency Medicine

## 2013-10-24 DIAGNOSIS — R079 Chest pain, unspecified: Secondary | ICD-10-CM | POA: Diagnosis not present

## 2013-10-24 DIAGNOSIS — I129 Hypertensive chronic kidney disease with stage 1 through stage 4 chronic kidney disease, or unspecified chronic kidney disease: Secondary | ICD-10-CM | POA: Diagnosis not present

## 2013-10-24 DIAGNOSIS — Z95818 Presence of other cardiac implants and grafts: Secondary | ICD-10-CM | POA: Diagnosis not present

## 2013-10-24 DIAGNOSIS — I10 Essential (primary) hypertension: Secondary | ICD-10-CM | POA: Diagnosis not present

## 2013-10-24 DIAGNOSIS — R0789 Other chest pain: Secondary | ICD-10-CM | POA: Diagnosis not present

## 2013-10-24 DIAGNOSIS — R52 Pain, unspecified: Secondary | ICD-10-CM | POA: Diagnosis not present

## 2013-10-24 DIAGNOSIS — Z79899 Other long term (current) drug therapy: Secondary | ICD-10-CM | POA: Diagnosis not present

## 2013-10-24 DIAGNOSIS — Z9010 Acquired absence of unspecified breast and nipple: Secondary | ICD-10-CM | POA: Diagnosis not present

## 2013-10-24 DIAGNOSIS — I2510 Atherosclerotic heart disease of native coronary artery without angina pectoris: Secondary | ICD-10-CM | POA: Diagnosis not present

## 2013-10-24 DIAGNOSIS — E119 Type 2 diabetes mellitus without complications: Secondary | ICD-10-CM | POA: Diagnosis not present

## 2013-10-24 DIAGNOSIS — N189 Chronic kidney disease, unspecified: Secondary | ICD-10-CM | POA: Diagnosis not present

## 2013-10-24 LAB — BASIC METABOLIC PANEL
Anion Gap: 7 (ref 7–16)
BUN: 38 mg/dL — ABNORMAL HIGH (ref 7–18)
CALCIUM: 9.7 mg/dL (ref 8.5–10.1)
CREATININE: 1.94 mg/dL — AB (ref 0.60–1.30)
Chloride: 104 mmol/L (ref 98–107)
Co2: 26 mmol/L (ref 21–32)
EGFR (African American): 26 — ABNORMAL LOW
EGFR (Non-African Amer.): 23 — ABNORMAL LOW
GLUCOSE: 164 mg/dL — AB (ref 65–99)
OSMOLALITY: 287 (ref 275–301)
POTASSIUM: 5 mmol/L (ref 3.5–5.1)
SODIUM: 137 mmol/L (ref 136–145)

## 2013-10-24 LAB — CBC
HCT: 31.5 % — AB (ref 35.0–47.0)
HGB: 10.4 g/dL — AB (ref 12.0–16.0)
MCH: 30.8 pg (ref 26.0–34.0)
MCHC: 32.9 g/dL (ref 32.0–36.0)
MCV: 94 fL (ref 80–100)
PLATELETS: 182 10*3/uL (ref 150–440)
RBC: 3.37 10*6/uL — AB (ref 3.80–5.20)
RDW: 15.3 % — AB (ref 11.5–14.5)
WBC: 6.9 10*3/uL (ref 3.6–11.0)

## 2013-10-24 LAB — MAGNESIUM: Magnesium: 1.8 mg/dL

## 2013-10-24 LAB — TROPONIN I
Troponin-I: <0.02 ng/mL
Troponin-I: <0.02 ng/mL

## 2013-10-31 ENCOUNTER — Ambulatory Visit: Payer: Self-pay | Admitting: Oncology

## 2013-10-31 DIAGNOSIS — Z79899 Other long term (current) drug therapy: Secondary | ICD-10-CM | POA: Diagnosis not present

## 2013-10-31 DIAGNOSIS — Z170 Estrogen receptor positive status [ER+]: Secondary | ICD-10-CM | POA: Diagnosis not present

## 2013-10-31 DIAGNOSIS — C50919 Malignant neoplasm of unspecified site of unspecified female breast: Secondary | ICD-10-CM | POA: Diagnosis not present

## 2013-10-31 DIAGNOSIS — D649 Anemia, unspecified: Secondary | ICD-10-CM | POA: Diagnosis not present

## 2013-10-31 DIAGNOSIS — N19 Unspecified kidney failure: Secondary | ICD-10-CM | POA: Diagnosis not present

## 2013-10-31 LAB — COMPREHENSIVE METABOLIC PANEL
ALBUMIN: 3.5 g/dL (ref 3.4–5.0)
ALT: 20 U/L (ref 12–78)
AST: 19 U/L (ref 15–37)
Alkaline Phosphatase: 77 U/L
Anion Gap: 6 — ABNORMAL LOW (ref 7–16)
BUN: 37 mg/dL — ABNORMAL HIGH (ref 7–18)
Bilirubin,Total: 0.4 mg/dL (ref 0.2–1.0)
CHLORIDE: 103 mmol/L (ref 98–107)
Calcium, Total: 9.7 mg/dL (ref 8.5–10.1)
Co2: 28 mmol/L (ref 21–32)
Creatinine: 2.01 mg/dL — ABNORMAL HIGH (ref 0.60–1.30)
EGFR (African American): 25 — ABNORMAL LOW
EGFR (Non-African Amer.): 22 — ABNORMAL LOW
GLUCOSE: 68 mg/dL (ref 65–99)
OSMOLALITY: 281 (ref 275–301)
POTASSIUM: 5.2 mmol/L — AB (ref 3.5–5.1)
Sodium: 137 mmol/L (ref 136–145)
TOTAL PROTEIN: 7.3 g/dL (ref 6.4–8.2)

## 2013-10-31 LAB — CBC CANCER CENTER
BASOS PCT: 1.3 %
Basophil #: 0.1 x10 3/mm (ref 0.0–0.1)
Eosinophil #: 0.1 x10 3/mm (ref 0.0–0.7)
Eosinophil %: 1.9 %
HCT: 30.9 % — ABNORMAL LOW (ref 35.0–47.0)
HGB: 10.2 g/dL — AB (ref 12.0–16.0)
Lymphocyte #: 2.5 x10 3/mm (ref 1.0–3.6)
Lymphocyte %: 32.9 %
MCH: 30.8 pg (ref 26.0–34.0)
MCHC: 32.9 g/dL (ref 32.0–36.0)
MCV: 94 fL (ref 80–100)
Monocyte #: 0.7 x10 3/mm (ref 0.2–0.9)
Monocyte %: 8.6 %
Neutrophil #: 4.2 x10 3/mm (ref 1.4–6.5)
Neutrophil %: 55.3 %
Platelet: 196 x10 3/mm (ref 150–440)
RBC: 3.29 10*6/uL — ABNORMAL LOW (ref 3.80–5.20)
RDW: 15.1 % — AB (ref 11.5–14.5)
WBC: 7.6 x10 3/mm (ref 3.6–11.0)

## 2013-12-01 ENCOUNTER — Ambulatory Visit: Payer: Self-pay | Admitting: Oncology

## 2013-12-01 DIAGNOSIS — E039 Hypothyroidism, unspecified: Secondary | ICD-10-CM | POA: Diagnosis not present

## 2013-12-01 DIAGNOSIS — I499 Cardiac arrhythmia, unspecified: Secondary | ICD-10-CM | POA: Diagnosis not present

## 2013-12-01 DIAGNOSIS — Z853 Personal history of malignant neoplasm of breast: Secondary | ICD-10-CM | POA: Diagnosis not present

## 2013-12-01 DIAGNOSIS — I10 Essential (primary) hypertension: Secondary | ICD-10-CM | POA: Diagnosis not present

## 2013-12-01 DIAGNOSIS — Z79899 Other long term (current) drug therapy: Secondary | ICD-10-CM | POA: Diagnosis not present

## 2013-12-01 DIAGNOSIS — N19 Unspecified kidney failure: Secondary | ICD-10-CM | POA: Diagnosis not present

## 2013-12-01 DIAGNOSIS — E7800 Pure hypercholesterolemia, unspecified: Secondary | ICD-10-CM | POA: Diagnosis not present

## 2013-12-01 DIAGNOSIS — M109 Gout, unspecified: Secondary | ICD-10-CM | POA: Diagnosis not present

## 2013-12-01 DIAGNOSIS — K5289 Other specified noninfective gastroenteritis and colitis: Secondary | ICD-10-CM | POA: Diagnosis not present

## 2013-12-01 DIAGNOSIS — E119 Type 2 diabetes mellitus without complications: Secondary | ICD-10-CM | POA: Diagnosis not present

## 2013-12-01 DIAGNOSIS — Z7982 Long term (current) use of aspirin: Secondary | ICD-10-CM | POA: Diagnosis not present

## 2013-12-01 DIAGNOSIS — D649 Anemia, unspecified: Secondary | ICD-10-CM | POA: Diagnosis not present

## 2013-12-01 DIAGNOSIS — Z170 Estrogen receptor positive status [ER+]: Secondary | ICD-10-CM | POA: Diagnosis not present

## 2013-12-12 DIAGNOSIS — N19 Unspecified kidney failure: Secondary | ICD-10-CM | POA: Diagnosis not present

## 2013-12-12 DIAGNOSIS — Z170 Estrogen receptor positive status [ER+]: Secondary | ICD-10-CM | POA: Diagnosis not present

## 2013-12-12 DIAGNOSIS — Z853 Personal history of malignant neoplasm of breast: Secondary | ICD-10-CM | POA: Diagnosis not present

## 2013-12-12 DIAGNOSIS — D649 Anemia, unspecified: Secondary | ICD-10-CM | POA: Diagnosis not present

## 2013-12-12 LAB — CBC CANCER CENTER
BASOS ABS: 0.1 x10 3/mm (ref 0.0–0.1)
Basophil %: 0.7 %
Eosinophil #: 0.1 x10 3/mm (ref 0.0–0.7)
Eosinophil %: 1.6 %
HCT: 31.9 % — ABNORMAL LOW (ref 35.0–47.0)
HGB: 10.5 g/dL — ABNORMAL LOW (ref 12.0–16.0)
LYMPHS ABS: 2.6 x10 3/mm (ref 1.0–3.6)
Lymphocyte %: 32 %
MCH: 30.8 pg (ref 26.0–34.0)
MCHC: 32.9 g/dL (ref 32.0–36.0)
MCV: 94 fL (ref 80–100)
Monocyte #: 0.8 x10 3/mm (ref 0.2–0.9)
Monocyte %: 9.8 %
NEUTROS ABS: 4.4 x10 3/mm (ref 1.4–6.5)
NEUTROS PCT: 55.9 %
Platelet: 195 x10 3/mm (ref 150–440)
RBC: 3.41 10*6/uL — ABNORMAL LOW (ref 3.80–5.20)
RDW: 15.2 % — ABNORMAL HIGH (ref 11.5–14.5)
WBC: 8 x10 3/mm (ref 3.6–11.0)

## 2013-12-12 LAB — COMPREHENSIVE METABOLIC PANEL
AST: 18 U/L (ref 15–37)
Albumin: 3.6 g/dL (ref 3.4–5.0)
Alkaline Phosphatase: 81 U/L
Anion Gap: 6 — ABNORMAL LOW (ref 7–16)
BILIRUBIN TOTAL: 0.5 mg/dL (ref 0.2–1.0)
BUN: 39 mg/dL — ABNORMAL HIGH (ref 7–18)
CHLORIDE: 102 mmol/L (ref 98–107)
Calcium, Total: 10 mg/dL (ref 8.5–10.1)
Co2: 30 mmol/L (ref 21–32)
Creatinine: 2.15 mg/dL — ABNORMAL HIGH (ref 0.60–1.30)
EGFR (African American): 23 — ABNORMAL LOW
EGFR (Non-African Amer.): 20 — ABNORMAL LOW
Glucose: 70 mg/dL (ref 65–99)
Osmolality: 283 (ref 275–301)
Potassium: 4.9 mmol/L (ref 3.5–5.1)
SGPT (ALT): 21 U/L (ref 12–78)
Sodium: 138 mmol/L (ref 136–145)
TOTAL PROTEIN: 7.4 g/dL (ref 6.4–8.2)

## 2013-12-31 ENCOUNTER — Ambulatory Visit: Payer: Self-pay | Admitting: Oncology

## 2013-12-31 DIAGNOSIS — Z79899 Other long term (current) drug therapy: Secondary | ICD-10-CM | POA: Diagnosis not present

## 2013-12-31 DIAGNOSIS — N19 Unspecified kidney failure: Secondary | ICD-10-CM | POA: Diagnosis not present

## 2013-12-31 DIAGNOSIS — Z170 Estrogen receptor positive status [ER+]: Secondary | ICD-10-CM | POA: Diagnosis not present

## 2013-12-31 DIAGNOSIS — Z853 Personal history of malignant neoplasm of breast: Secondary | ICD-10-CM | POA: Diagnosis not present

## 2013-12-31 DIAGNOSIS — D649 Anemia, unspecified: Secondary | ICD-10-CM | POA: Diagnosis not present

## 2014-01-12 LAB — CANCER CENTER HEMOGLOBIN: HGB: 10.4 g/dL — ABNORMAL LOW (ref 12.0–16.0)

## 2014-01-15 DIAGNOSIS — N2581 Secondary hyperparathyroidism of renal origin: Secondary | ICD-10-CM | POA: Diagnosis not present

## 2014-01-15 DIAGNOSIS — D631 Anemia in chronic kidney disease: Secondary | ICD-10-CM | POA: Diagnosis not present

## 2014-01-15 DIAGNOSIS — N184 Chronic kidney disease, stage 4 (severe): Secondary | ICD-10-CM | POA: Diagnosis not present

## 2014-01-15 DIAGNOSIS — E872 Acidosis, unspecified: Secondary | ICD-10-CM | POA: Diagnosis not present

## 2014-01-15 DIAGNOSIS — N039 Chronic nephritic syndrome with unspecified morphologic changes: Secondary | ICD-10-CM | POA: Diagnosis not present

## 2014-01-15 DIAGNOSIS — R809 Proteinuria, unspecified: Secondary | ICD-10-CM | POA: Diagnosis not present

## 2014-01-15 DIAGNOSIS — I10 Essential (primary) hypertension: Secondary | ICD-10-CM | POA: Diagnosis not present

## 2014-01-26 DIAGNOSIS — Z79899 Other long term (current) drug therapy: Secondary | ICD-10-CM | POA: Diagnosis not present

## 2014-01-26 DIAGNOSIS — E039 Hypothyroidism, unspecified: Secondary | ICD-10-CM | POA: Diagnosis not present

## 2014-01-26 DIAGNOSIS — M109 Gout, unspecified: Secondary | ICD-10-CM | POA: Diagnosis not present

## 2014-01-29 DIAGNOSIS — R252 Cramp and spasm: Secondary | ICD-10-CM | POA: Diagnosis not present

## 2014-01-29 DIAGNOSIS — R51 Headache: Secondary | ICD-10-CM | POA: Diagnosis not present

## 2014-01-31 ENCOUNTER — Ambulatory Visit: Payer: Self-pay | Admitting: Oncology

## 2014-01-31 DIAGNOSIS — Z170 Estrogen receptor positive status [ER+]: Secondary | ICD-10-CM | POA: Diagnosis not present

## 2014-01-31 DIAGNOSIS — D649 Anemia, unspecified: Secondary | ICD-10-CM | POA: Diagnosis not present

## 2014-01-31 DIAGNOSIS — Z853 Personal history of malignant neoplasm of breast: Secondary | ICD-10-CM | POA: Diagnosis not present

## 2014-02-06 ENCOUNTER — Ambulatory Visit: Payer: Self-pay | Admitting: Oncology

## 2014-02-06 LAB — HEMOGLOBIN: HGB: 10.1 g/dL — ABNORMAL LOW (ref 12.0–16.0)

## 2014-02-18 DIAGNOSIS — I2510 Atherosclerotic heart disease of native coronary artery without angina pectoris: Secondary | ICD-10-CM | POA: Diagnosis not present

## 2014-02-18 DIAGNOSIS — I701 Atherosclerosis of renal artery: Secondary | ICD-10-CM | POA: Diagnosis not present

## 2014-02-18 DIAGNOSIS — I6529 Occlusion and stenosis of unspecified carotid artery: Secondary | ICD-10-CM | POA: Diagnosis not present

## 2014-02-18 DIAGNOSIS — I6350 Cerebral infarction due to unspecified occlusion or stenosis of unspecified cerebral artery: Secondary | ICD-10-CM | POA: Diagnosis not present

## 2014-02-26 DIAGNOSIS — R252 Cramp and spasm: Secondary | ICD-10-CM | POA: Diagnosis not present

## 2014-02-26 DIAGNOSIS — R51 Headache: Secondary | ICD-10-CM | POA: Diagnosis not present

## 2014-02-26 DIAGNOSIS — R42 Dizziness and giddiness: Secondary | ICD-10-CM | POA: Diagnosis not present

## 2014-02-26 DIAGNOSIS — Z9181 History of falling: Secondary | ICD-10-CM | POA: Diagnosis not present

## 2014-03-03 ENCOUNTER — Ambulatory Visit: Payer: Self-pay | Admitting: Neurology

## 2014-03-03 DIAGNOSIS — S0990XA Unspecified injury of head, initial encounter: Secondary | ICD-10-CM | POA: Diagnosis not present

## 2014-03-03 DIAGNOSIS — R51 Headache: Secondary | ICD-10-CM | POA: Diagnosis not present

## 2014-03-03 DIAGNOSIS — R42 Dizziness and giddiness: Secondary | ICD-10-CM | POA: Diagnosis not present

## 2014-03-03 DIAGNOSIS — Z9181 History of falling: Secondary | ICD-10-CM | POA: Diagnosis not present

## 2014-03-04 ENCOUNTER — Ambulatory Visit: Payer: Self-pay | Admitting: Oncology

## 2014-03-04 DIAGNOSIS — Z170 Estrogen receptor positive status [ER+]: Secondary | ICD-10-CM | POA: Diagnosis not present

## 2014-03-04 DIAGNOSIS — Z853 Personal history of malignant neoplasm of breast: Secondary | ICD-10-CM | POA: Diagnosis not present

## 2014-03-04 DIAGNOSIS — D649 Anemia, unspecified: Secondary | ICD-10-CM | POA: Diagnosis not present

## 2014-03-06 LAB — HEMOGLOBIN: HGB: 10.1 g/dL — ABNORMAL LOW (ref 12.0–16.0)

## 2014-03-25 DIAGNOSIS — E119 Type 2 diabetes mellitus without complications: Secondary | ICD-10-CM | POA: Insufficient documentation

## 2014-03-25 DIAGNOSIS — K5190 Ulcerative colitis, unspecified, without complications: Secondary | ICD-10-CM | POA: Insufficient documentation

## 2014-03-25 DIAGNOSIS — I10 Essential (primary) hypertension: Secondary | ICD-10-CM | POA: Diagnosis not present

## 2014-03-25 DIAGNOSIS — E039 Hypothyroidism, unspecified: Secondary | ICD-10-CM | POA: Insufficient documentation

## 2014-03-25 DIAGNOSIS — E7800 Pure hypercholesterolemia, unspecified: Secondary | ICD-10-CM | POA: Insufficient documentation

## 2014-03-25 DIAGNOSIS — K5520 Angiodysplasia of colon without hemorrhage: Secondary | ICD-10-CM | POA: Insufficient documentation

## 2014-04-02 ENCOUNTER — Ambulatory Visit: Payer: Self-pay | Admitting: Oncology

## 2014-04-03 ENCOUNTER — Ambulatory Visit: Payer: Self-pay | Admitting: Oncology

## 2014-04-03 DIAGNOSIS — Z23 Encounter for immunization: Secondary | ICD-10-CM | POA: Diagnosis not present

## 2014-04-03 DIAGNOSIS — Z853 Personal history of malignant neoplasm of breast: Secondary | ICD-10-CM | POA: Diagnosis not present

## 2014-04-03 LAB — HEMOGLOBIN: HGB: 10.8 g/dL — ABNORMAL LOW (ref 12.0–16.0)

## 2014-04-08 DIAGNOSIS — I952 Hypotension due to drugs: Secondary | ICD-10-CM | POA: Diagnosis not present

## 2014-04-09 DIAGNOSIS — N184 Chronic kidney disease, stage 4 (severe): Secondary | ICD-10-CM | POA: Diagnosis not present

## 2014-04-09 DIAGNOSIS — D631 Anemia in chronic kidney disease: Secondary | ICD-10-CM | POA: Diagnosis not present

## 2014-04-09 DIAGNOSIS — I10 Essential (primary) hypertension: Secondary | ICD-10-CM | POA: Diagnosis not present

## 2014-04-09 DIAGNOSIS — R809 Proteinuria, unspecified: Secondary | ICD-10-CM | POA: Diagnosis not present

## 2014-04-09 DIAGNOSIS — E872 Acidosis: Secondary | ICD-10-CM | POA: Diagnosis not present

## 2014-05-01 LAB — CANCER CENTER HEMOGLOBIN: HGB: 10.4 g/dL — AB (ref 12.0–16.0)

## 2014-05-02 ENCOUNTER — Inpatient Hospital Stay: Payer: Self-pay | Admitting: Internal Medicine

## 2014-05-02 DIAGNOSIS — R1084 Generalized abdominal pain: Secondary | ICD-10-CM | POA: Diagnosis not present

## 2014-05-02 DIAGNOSIS — K5190 Ulcerative colitis, unspecified, without complications: Secondary | ICD-10-CM | POA: Diagnosis present

## 2014-05-02 DIAGNOSIS — G8918 Other acute postprocedural pain: Secondary | ICD-10-CM | POA: Diagnosis not present

## 2014-05-02 DIAGNOSIS — E860 Dehydration: Secondary | ICD-10-CM | POA: Diagnosis not present

## 2014-05-02 DIAGNOSIS — N200 Calculus of kidney: Secondary | ICD-10-CM | POA: Diagnosis not present

## 2014-05-02 DIAGNOSIS — R300 Dysuria: Secondary | ICD-10-CM | POA: Diagnosis not present

## 2014-05-02 DIAGNOSIS — D649 Anemia, unspecified: Secondary | ICD-10-CM | POA: Diagnosis present

## 2014-05-02 DIAGNOSIS — I10 Essential (primary) hypertension: Secondary | ICD-10-CM | POA: Diagnosis not present

## 2014-05-02 DIAGNOSIS — F05 Delirium due to known physiological condition: Secondary | ICD-10-CM | POA: Diagnosis not present

## 2014-05-02 DIAGNOSIS — A419 Sepsis, unspecified organism: Secondary | ICD-10-CM | POA: Diagnosis not present

## 2014-05-02 DIAGNOSIS — E785 Hyperlipidemia, unspecified: Secondary | ICD-10-CM | POA: Diagnosis present

## 2014-05-02 DIAGNOSIS — N184 Chronic kidney disease, stage 4 (severe): Secondary | ICD-10-CM | POA: Diagnosis present

## 2014-05-02 DIAGNOSIS — E1121 Type 2 diabetes mellitus with diabetic nephropathy: Secondary | ICD-10-CM | POA: Diagnosis not present

## 2014-05-02 DIAGNOSIS — K5732 Diverticulitis of large intestine without perforation or abscess without bleeding: Secondary | ICD-10-CM | POA: Diagnosis not present

## 2014-05-02 DIAGNOSIS — K5720 Diverticulitis of large intestine with perforation and abscess without bleeding: Secondary | ICD-10-CM | POA: Diagnosis not present

## 2014-05-02 DIAGNOSIS — E875 Hyperkalemia: Secondary | ICD-10-CM | POA: Diagnosis not present

## 2014-05-02 DIAGNOSIS — E11649 Type 2 diabetes mellitus with hypoglycemia without coma: Secondary | ICD-10-CM | POA: Diagnosis present

## 2014-05-02 DIAGNOSIS — K219 Gastro-esophageal reflux disease without esophagitis: Secondary | ICD-10-CM | POA: Diagnosis present

## 2014-05-02 DIAGNOSIS — E119 Type 2 diabetes mellitus without complications: Secondary | ICD-10-CM | POA: Diagnosis not present

## 2014-05-02 DIAGNOSIS — R0902 Hypoxemia: Secondary | ICD-10-CM | POA: Diagnosis not present

## 2014-05-02 DIAGNOSIS — Z853 Personal history of malignant neoplasm of breast: Secondary | ICD-10-CM | POA: Diagnosis not present

## 2014-05-02 DIAGNOSIS — N3000 Acute cystitis without hematuria: Secondary | ICD-10-CM | POA: Diagnosis present

## 2014-05-02 DIAGNOSIS — B9620 Unspecified Escherichia coli [E. coli] as the cause of diseases classified elsewhere: Secondary | ICD-10-CM | POA: Diagnosis present

## 2014-05-02 DIAGNOSIS — K567 Ileus, unspecified: Secondary | ICD-10-CM | POA: Diagnosis not present

## 2014-05-02 DIAGNOSIS — I6782 Cerebral ischemia: Secondary | ICD-10-CM | POA: Diagnosis not present

## 2014-05-02 DIAGNOSIS — R748 Abnormal levels of other serum enzymes: Secondary | ICD-10-CM | POA: Diagnosis not present

## 2014-05-02 DIAGNOSIS — I129 Hypertensive chronic kidney disease with stage 1 through stage 4 chronic kidney disease, or unspecified chronic kidney disease: Secondary | ICD-10-CM | POA: Diagnosis present

## 2014-05-02 DIAGNOSIS — K913 Postprocedural intestinal obstruction: Secondary | ICD-10-CM | POA: Diagnosis not present

## 2014-05-02 DIAGNOSIS — I2510 Atherosclerotic heart disease of native coronary artery without angina pectoris: Secondary | ICD-10-CM | POA: Diagnosis present

## 2014-05-02 DIAGNOSIS — M810 Age-related osteoporosis without current pathological fracture: Secondary | ICD-10-CM | POA: Diagnosis present

## 2014-05-02 DIAGNOSIS — Z8249 Family history of ischemic heart disease and other diseases of the circulatory system: Secondary | ICD-10-CM | POA: Diagnosis not present

## 2014-05-02 DIAGNOSIS — I701 Atherosclerosis of renal artery: Secondary | ICD-10-CM | POA: Diagnosis present

## 2014-05-02 DIAGNOSIS — R0989 Other specified symptoms and signs involving the circulatory and respiratory systems: Secondary | ICD-10-CM | POA: Diagnosis not present

## 2014-05-02 DIAGNOSIS — N322 Vesical fistula, not elsewhere classified: Secondary | ICD-10-CM | POA: Diagnosis present

## 2014-05-02 DIAGNOSIS — R4182 Altered mental status, unspecified: Secondary | ICD-10-CM | POA: Diagnosis not present

## 2014-05-02 DIAGNOSIS — E039 Hypothyroidism, unspecified: Secondary | ICD-10-CM | POA: Diagnosis present

## 2014-05-02 DIAGNOSIS — B9689 Other specified bacterial agents as the cause of diseases classified elsewhere: Secondary | ICD-10-CM | POA: Diagnosis not present

## 2014-05-02 DIAGNOSIS — N321 Vesicointestinal fistula: Secondary | ICD-10-CM | POA: Diagnosis not present

## 2014-05-02 DIAGNOSIS — N390 Urinary tract infection, site not specified: Secondary | ICD-10-CM | POA: Diagnosis not present

## 2014-05-02 LAB — URINALYSIS, COMPLETE
Bilirubin,UR: NEGATIVE
Glucose,UR: NEGATIVE mg/dL (ref 0–75)
Ketone: NEGATIVE
Nitrite: NEGATIVE
Ph: 7 (ref 4.5–8.0)
Protein: 100
Specific Gravity: 1.01 (ref 1.003–1.030)
WBC UR: 639 /HPF (ref 0–5)

## 2014-05-02 LAB — CBC
HCT: 31.7 % — ABNORMAL LOW (ref 35.0–47.0)
HGB: 10.2 g/dL — ABNORMAL LOW (ref 12.0–16.0)
MCH: 31.1 pg (ref 26.0–34.0)
MCHC: 32.3 g/dL (ref 32.0–36.0)
MCV: 96 fL (ref 80–100)
Platelet: 242 10*3/uL (ref 150–440)
RBC: 3.29 10*6/uL — ABNORMAL LOW (ref 3.80–5.20)
RDW: 14.3 % (ref 11.5–14.5)
WBC: 8 10*3/uL (ref 3.6–11.0)

## 2014-05-02 LAB — COMPREHENSIVE METABOLIC PANEL
ALBUMIN: 3.1 g/dL — AB (ref 3.4–5.0)
ALK PHOS: 91 U/L
ALT: 18 U/L
Anion Gap: 9 (ref 7–16)
BUN: 48 mg/dL — AB (ref 7–18)
Bilirubin,Total: 0.6 mg/dL (ref 0.2–1.0)
Calcium, Total: 8.8 mg/dL (ref 8.5–10.1)
Chloride: 105 mmol/L (ref 98–107)
Co2: 25 mmol/L (ref 21–32)
Creatinine: 2.14 mg/dL — ABNORMAL HIGH (ref 0.60–1.30)
EGFR (Non-African Amer.): 23 — ABNORMAL LOW
GFR CALC AF AMER: 28 — AB
Glucose: 139 mg/dL — ABNORMAL HIGH (ref 65–99)
Osmolality: 292 (ref 275–301)
Potassium: 4.6 mmol/L (ref 3.5–5.1)
SGOT(AST): 27 U/L (ref 15–37)
SODIUM: 139 mmol/L (ref 136–145)
Total Protein: 7.3 g/dL (ref 6.4–8.2)

## 2014-05-02 LAB — DIFFERENTIAL
BASOS ABS: 0.1 10*3/uL (ref 0.0–0.1)
BASOS PCT: 0.7 %
Eosinophil #: 0.1 10*3/uL (ref 0.0–0.7)
Eosinophil %: 1 %
LYMPHS ABS: 1.2 10*3/uL (ref 1.0–3.6)
Lymphocyte %: 15 %
MONO ABS: 1 x10 3/mm — AB (ref 0.2–0.9)
Monocyte %: 12.3 %
NEUTROS PCT: 71 %
Neutrophil #: 5.7 10*3/uL (ref 1.4–6.5)

## 2014-05-03 LAB — HEMOGLOBIN A1C: Hemoglobin A1C: 6.2 % (ref 4.2–6.3)

## 2014-05-03 LAB — CBC WITH DIFFERENTIAL/PLATELET
BASOS ABS: 0 10*3/uL (ref 0.0–0.1)
BASOS PCT: 0.6 %
Eosinophil #: 0 10*3/uL (ref 0.0–0.7)
Eosinophil %: 0.7 %
HCT: 30 % — AB (ref 35.0–47.0)
HGB: 9.8 g/dL — AB (ref 12.0–16.0)
LYMPHS PCT: 14.6 %
Lymphocyte #: 0.9 10*3/uL — ABNORMAL LOW (ref 1.0–3.6)
MCH: 31.3 pg (ref 26.0–34.0)
MCHC: 32.6 g/dL (ref 32.0–36.0)
MCV: 96 fL (ref 80–100)
MONOS PCT: 18.4 %
Monocyte #: 1.2 x10 3/mm — ABNORMAL HIGH (ref 0.2–0.9)
NEUTROS ABS: 4.2 10*3/uL (ref 1.4–6.5)
Neutrophil %: 65.7 %
Platelet: 207 10*3/uL (ref 150–440)
RBC: 3.12 10*6/uL — ABNORMAL LOW (ref 3.80–5.20)
RDW: 14.4 % (ref 11.5–14.5)
WBC: 6.4 10*3/uL (ref 3.6–11.0)

## 2014-05-03 LAB — BASIC METABOLIC PANEL
Anion Gap: 6 — ABNORMAL LOW (ref 7–16)
BUN: 39 mg/dL — ABNORMAL HIGH (ref 7–18)
CALCIUM: 8.7 mg/dL (ref 8.5–10.1)
Chloride: 107 mmol/L (ref 98–107)
Co2: 26 mmol/L (ref 21–32)
Creatinine: 1.97 mg/dL — ABNORMAL HIGH (ref 0.60–1.30)
Glucose: 44 mg/dL — ABNORMAL LOW (ref 65–99)
OSMOLALITY: 284 (ref 275–301)
POTASSIUM: 4.2 mmol/L (ref 3.5–5.1)
Sodium: 139 mmol/L (ref 136–145)

## 2014-05-03 LAB — MAGNESIUM: MAGNESIUM: 1.4 mg/dL — AB

## 2014-05-04 LAB — BASIC METABOLIC PANEL
ANION GAP: 9 (ref 7–16)
BUN: 26 mg/dL — ABNORMAL HIGH (ref 7–18)
CHLORIDE: 103 mmol/L (ref 98–107)
CO2: 27 mmol/L (ref 21–32)
CREATININE: 1.82 mg/dL — AB (ref 0.60–1.30)
Calcium, Total: 9 mg/dL (ref 8.5–10.1)
EGFR (African American): 34 — ABNORMAL LOW
GFR CALC NON AF AMER: 28 — AB
Glucose: 94 mg/dL (ref 65–99)
Osmolality: 282 (ref 275–301)
POTASSIUM: 4.3 mmol/L (ref 3.5–5.1)
Sodium: 139 mmol/L (ref 136–145)

## 2014-05-05 LAB — BASIC METABOLIC PANEL
Anion Gap: 6 — ABNORMAL LOW (ref 7–16)
BUN: 27 mg/dL — ABNORMAL HIGH (ref 7–18)
CO2: 28 mmol/L (ref 21–32)
Calcium, Total: 8.9 mg/dL (ref 8.5–10.1)
Chloride: 103 mmol/L (ref 98–107)
Creatinine: 1.98 mg/dL — ABNORMAL HIGH (ref 0.60–1.30)
EGFR (African American): 31 — ABNORMAL LOW
EGFR (Non-African Amer.): 25 — ABNORMAL LOW
Glucose: 136 mg/dL — ABNORMAL HIGH (ref 65–99)
OSMOLALITY: 281 (ref 275–301)
POTASSIUM: 4.5 mmol/L (ref 3.5–5.1)
SODIUM: 137 mmol/L (ref 136–145)

## 2014-05-05 LAB — URINE CULTURE

## 2014-05-07 LAB — BASIC METABOLIC PANEL
Anion Gap: 6 — ABNORMAL LOW (ref 7–16)
BUN: 22 mg/dL — AB (ref 7–18)
CALCIUM: 8.7 mg/dL (ref 8.5–10.1)
CHLORIDE: 104 mmol/L (ref 98–107)
Co2: 29 mmol/L (ref 21–32)
Creatinine: 2.02 mg/dL — ABNORMAL HIGH (ref 0.60–1.30)
EGFR (African American): 30 — ABNORMAL LOW
EGFR (Non-African Amer.): 25 — ABNORMAL LOW
Glucose: 254 mg/dL — ABNORMAL HIGH (ref 65–99)
OSMOLALITY: 290 (ref 275–301)
POTASSIUM: 5.2 mmol/L — AB (ref 3.5–5.1)
Sodium: 139 mmol/L (ref 136–145)

## 2014-05-07 LAB — CBC WITH DIFFERENTIAL/PLATELET
BASOS PCT: 0.2 %
Basophil #: 0 10*3/uL (ref 0.0–0.1)
Eosinophil #: 0 10*3/uL (ref 0.0–0.7)
Eosinophil %: 0 %
HCT: 32.9 % — AB (ref 35.0–47.0)
HGB: 10.5 g/dL — AB (ref 12.0–16.0)
Lymphocyte #: 0.4 10*3/uL — ABNORMAL LOW (ref 1.0–3.6)
Lymphocyte %: 2.5 %
MCH: 30.6 pg (ref 26.0–34.0)
MCHC: 31.9 g/dL — ABNORMAL LOW (ref 32.0–36.0)
MCV: 96 fL (ref 80–100)
MONOS PCT: 4.3 %
Monocyte #: 0.7 x10 3/mm (ref 0.2–0.9)
Neutrophil #: 15.8 10*3/uL — ABNORMAL HIGH (ref 1.4–6.5)
Neutrophil %: 93 %
RBC: 3.43 10*6/uL — ABNORMAL LOW (ref 3.80–5.20)
RDW: 14.2 % (ref 11.5–14.5)
WBC: 17 10*3/uL — AB (ref 3.6–11.0)

## 2014-05-07 LAB — POTASSIUM
Potassium: 5.4 mmol/L — ABNORMAL HIGH (ref 3.5–5.1)
Potassium: 4.6 mmol/L (ref 3.5–5.1)

## 2014-05-07 LAB — PLATELET COUNT: Platelet: 213 10*3/uL (ref 150–440)

## 2014-05-08 LAB — CBC WITH DIFFERENTIAL/PLATELET
BASOS PCT: 0.3 %
Basophil #: 0 10*3/uL (ref 0.0–0.1)
EOS PCT: 0.6 %
Eosinophil #: 0.1 10*3/uL (ref 0.0–0.7)
HCT: 32.4 % — ABNORMAL LOW (ref 35.0–47.0)
HGB: 10.3 g/dL — AB (ref 12.0–16.0)
Lymphocyte #: 1.2 10*3/uL (ref 1.0–3.6)
Lymphocyte %: 9.4 %
MCH: 30.3 pg (ref 26.0–34.0)
MCHC: 31.7 g/dL — ABNORMAL LOW (ref 32.0–36.0)
MCV: 96 fL (ref 80–100)
MONO ABS: 1.2 x10 3/mm — AB (ref 0.2–0.9)
MONOS PCT: 8.7 %
NEUTROS ABS: 10.7 10*3/uL — AB (ref 1.4–6.5)
Neutrophil %: 81 %
Platelet: 198 10*3/uL (ref 150–440)
RBC: 3.38 10*6/uL — ABNORMAL LOW (ref 3.80–5.20)
RDW: 14.3 % (ref 11.5–14.5)
WBC: 13.2 10*3/uL — AB (ref 3.6–11.0)

## 2014-05-08 LAB — BASIC METABOLIC PANEL
ANION GAP: 2 — AB (ref 7–16)
BUN: 26 mg/dL — ABNORMAL HIGH (ref 7–18)
CO2: 34 mmol/L — AB (ref 21–32)
Calcium, Total: 8.9 mg/dL (ref 8.5–10.1)
Chloride: 102 mmol/L (ref 98–107)
Creatinine: 1.94 mg/dL — ABNORMAL HIGH (ref 0.60–1.30)
EGFR (African American): 31 — ABNORMAL LOW
EGFR (Non-African Amer.): 26 — ABNORMAL LOW
Glucose: 142 mg/dL — ABNORMAL HIGH (ref 65–99)
Osmolality: 283 (ref 275–301)
POTASSIUM: 4.5 mmol/L (ref 3.5–5.1)
Sodium: 138 mmol/L (ref 136–145)

## 2014-05-10 LAB — CBC WITH DIFFERENTIAL/PLATELET
BASOS ABS: 0.1 10*3/uL (ref 0.0–0.1)
BASOS PCT: 1.2 %
EOS ABS: 0.3 10*3/uL (ref 0.0–0.7)
EOS PCT: 2.5 %
HCT: 29.7 % — AB (ref 35.0–47.0)
HGB: 9.6 g/dL — ABNORMAL LOW (ref 12.0–16.0)
LYMPHS PCT: 13.8 %
Lymphocyte #: 1.4 10*3/uL (ref 1.0–3.6)
MCH: 31 pg (ref 26.0–34.0)
MCHC: 32.2 g/dL (ref 32.0–36.0)
MCV: 96 fL (ref 80–100)
MONOS PCT: 8.9 %
Monocyte #: 0.9 x10 3/mm (ref 0.2–0.9)
NEUTROS ABS: 7.6 10*3/uL — AB (ref 1.4–6.5)
Neutrophil %: 73.6 %
PLATELETS: 236 10*3/uL (ref 150–440)
RBC: 3.09 10*6/uL — ABNORMAL LOW (ref 3.80–5.20)
RDW: 14.6 % — ABNORMAL HIGH (ref 11.5–14.5)
WBC: 10.4 10*3/uL (ref 3.6–11.0)

## 2014-05-10 LAB — BASIC METABOLIC PANEL
ANION GAP: 4 — AB (ref 7–16)
BUN: 26 mg/dL — AB (ref 7–18)
CREATININE: 1.94 mg/dL — AB (ref 0.60–1.30)
Calcium, Total: 8.3 mg/dL — ABNORMAL LOW (ref 8.5–10.1)
Chloride: 101 mmol/L (ref 98–107)
Co2: 34 mmol/L — ABNORMAL HIGH (ref 21–32)
GFR CALC AF AMER: 31 — AB
GFR CALC NON AF AMER: 26 — AB
Glucose: 140 mg/dL — ABNORMAL HIGH (ref 65–99)
Osmolality: 285 (ref 275–301)
Potassium: 4.2 mmol/L (ref 3.5–5.1)
Sodium: 139 mmol/L (ref 136–145)

## 2014-05-11 LAB — BASIC METABOLIC PANEL
ANION GAP: 7 (ref 7–16)
BUN: 26 mg/dL — AB (ref 7–18)
CALCIUM: 8.7 mg/dL (ref 8.5–10.1)
CO2: 31 mmol/L (ref 21–32)
Chloride: 100 mmol/L (ref 98–107)
Creatinine: 1.78 mg/dL — ABNORMAL HIGH (ref 0.60–1.30)
EGFR (African American): 35 — ABNORMAL LOW
GFR CALC NON AF AMER: 29 — AB
GLUCOSE: 126 mg/dL — AB (ref 65–99)
Osmolality: 282 (ref 275–301)
Potassium: 4.1 mmol/L (ref 3.5–5.1)
SODIUM: 138 mmol/L (ref 136–145)

## 2014-05-14 ENCOUNTER — Ambulatory Visit: Payer: Self-pay | Admitting: Oncology

## 2014-05-14 DIAGNOSIS — Z432 Encounter for attention to ileostomy: Secondary | ICD-10-CM | POA: Diagnosis not present

## 2014-05-14 DIAGNOSIS — K5780 Diverticulitis of intestine, part unspecified, with perforation and abscess without bleeding: Secondary | ICD-10-CM | POA: Diagnosis not present

## 2014-05-14 DIAGNOSIS — Z48815 Encounter for surgical aftercare following surgery on the digestive system: Secondary | ICD-10-CM | POA: Diagnosis not present

## 2014-05-14 DIAGNOSIS — K219 Gastro-esophageal reflux disease without esophagitis: Secondary | ICD-10-CM | POA: Diagnosis not present

## 2014-05-14 DIAGNOSIS — M109 Gout, unspecified: Secondary | ICD-10-CM | POA: Diagnosis not present

## 2014-05-14 DIAGNOSIS — I10 Essential (primary) hypertension: Secondary | ICD-10-CM | POA: Diagnosis not present

## 2014-05-14 DIAGNOSIS — Z87440 Personal history of urinary (tract) infections: Secondary | ICD-10-CM | POA: Diagnosis not present

## 2014-05-14 DIAGNOSIS — I2510 Atherosclerotic heart disease of native coronary artery without angina pectoris: Secondary | ICD-10-CM | POA: Diagnosis not present

## 2014-05-14 DIAGNOSIS — E119 Type 2 diabetes mellitus without complications: Secondary | ICD-10-CM | POA: Diagnosis not present

## 2014-05-14 DIAGNOSIS — M810 Age-related osteoporosis without current pathological fracture: Secondary | ICD-10-CM | POA: Diagnosis not present

## 2014-05-15 DIAGNOSIS — Z432 Encounter for attention to ileostomy: Secondary | ICD-10-CM | POA: Diagnosis not present

## 2014-05-15 DIAGNOSIS — E119 Type 2 diabetes mellitus without complications: Secondary | ICD-10-CM | POA: Diagnosis not present

## 2014-05-15 DIAGNOSIS — K219 Gastro-esophageal reflux disease without esophagitis: Secondary | ICD-10-CM | POA: Diagnosis not present

## 2014-05-15 DIAGNOSIS — K5780 Diverticulitis of intestine, part unspecified, with perforation and abscess without bleeding: Secondary | ICD-10-CM | POA: Diagnosis not present

## 2014-05-15 DIAGNOSIS — Z48815 Encounter for surgical aftercare following surgery on the digestive system: Secondary | ICD-10-CM | POA: Diagnosis not present

## 2014-05-15 DIAGNOSIS — I2510 Atherosclerotic heart disease of native coronary artery without angina pectoris: Secondary | ICD-10-CM | POA: Diagnosis not present

## 2014-05-17 DIAGNOSIS — K219 Gastro-esophageal reflux disease without esophagitis: Secondary | ICD-10-CM | POA: Diagnosis not present

## 2014-05-17 DIAGNOSIS — K5780 Diverticulitis of intestine, part unspecified, with perforation and abscess without bleeding: Secondary | ICD-10-CM | POA: Diagnosis not present

## 2014-05-17 DIAGNOSIS — E119 Type 2 diabetes mellitus without complications: Secondary | ICD-10-CM | POA: Diagnosis not present

## 2014-05-17 DIAGNOSIS — I2510 Atherosclerotic heart disease of native coronary artery without angina pectoris: Secondary | ICD-10-CM | POA: Diagnosis not present

## 2014-05-17 DIAGNOSIS — Z48815 Encounter for surgical aftercare following surgery on the digestive system: Secondary | ICD-10-CM | POA: Diagnosis not present

## 2014-05-17 DIAGNOSIS — Z432 Encounter for attention to ileostomy: Secondary | ICD-10-CM | POA: Diagnosis not present

## 2014-05-18 DIAGNOSIS — K5780 Diverticulitis of intestine, part unspecified, with perforation and abscess without bleeding: Secondary | ICD-10-CM | POA: Diagnosis not present

## 2014-05-18 DIAGNOSIS — K219 Gastro-esophageal reflux disease without esophagitis: Secondary | ICD-10-CM | POA: Diagnosis not present

## 2014-05-18 DIAGNOSIS — Z48815 Encounter for surgical aftercare following surgery on the digestive system: Secondary | ICD-10-CM | POA: Diagnosis not present

## 2014-05-18 DIAGNOSIS — Z432 Encounter for attention to ileostomy: Secondary | ICD-10-CM | POA: Diagnosis not present

## 2014-05-18 DIAGNOSIS — E119 Type 2 diabetes mellitus without complications: Secondary | ICD-10-CM | POA: Diagnosis not present

## 2014-05-18 DIAGNOSIS — I2510 Atherosclerotic heart disease of native coronary artery without angina pectoris: Secondary | ICD-10-CM | POA: Diagnosis not present

## 2014-05-20 DIAGNOSIS — K5780 Diverticulitis of intestine, part unspecified, with perforation and abscess without bleeding: Secondary | ICD-10-CM | POA: Diagnosis not present

## 2014-05-20 DIAGNOSIS — Z432 Encounter for attention to ileostomy: Secondary | ICD-10-CM | POA: Diagnosis not present

## 2014-05-20 DIAGNOSIS — Z48815 Encounter for surgical aftercare following surgery on the digestive system: Secondary | ICD-10-CM | POA: Diagnosis not present

## 2014-05-20 DIAGNOSIS — E119 Type 2 diabetes mellitus without complications: Secondary | ICD-10-CM | POA: Diagnosis not present

## 2014-05-20 DIAGNOSIS — I2510 Atherosclerotic heart disease of native coronary artery without angina pectoris: Secondary | ICD-10-CM | POA: Diagnosis not present

## 2014-05-20 DIAGNOSIS — K219 Gastro-esophageal reflux disease without esophagitis: Secondary | ICD-10-CM | POA: Diagnosis not present

## 2014-05-22 DIAGNOSIS — K5780 Diverticulitis of intestine, part unspecified, with perforation and abscess without bleeding: Secondary | ICD-10-CM | POA: Diagnosis not present

## 2014-05-22 DIAGNOSIS — I2510 Atherosclerotic heart disease of native coronary artery without angina pectoris: Secondary | ICD-10-CM | POA: Diagnosis not present

## 2014-05-22 DIAGNOSIS — Z432 Encounter for attention to ileostomy: Secondary | ICD-10-CM | POA: Diagnosis not present

## 2014-05-22 DIAGNOSIS — Z48815 Encounter for surgical aftercare following surgery on the digestive system: Secondary | ICD-10-CM | POA: Diagnosis not present

## 2014-05-22 DIAGNOSIS — E119 Type 2 diabetes mellitus without complications: Secondary | ICD-10-CM | POA: Diagnosis not present

## 2014-05-22 DIAGNOSIS — K219 Gastro-esophageal reflux disease without esophagitis: Secondary | ICD-10-CM | POA: Diagnosis not present

## 2014-05-25 DIAGNOSIS — Z48815 Encounter for surgical aftercare following surgery on the digestive system: Secondary | ICD-10-CM | POA: Diagnosis not present

## 2014-05-25 DIAGNOSIS — K219 Gastro-esophageal reflux disease without esophagitis: Secondary | ICD-10-CM | POA: Diagnosis not present

## 2014-05-25 DIAGNOSIS — E119 Type 2 diabetes mellitus without complications: Secondary | ICD-10-CM | POA: Diagnosis not present

## 2014-05-25 DIAGNOSIS — K5780 Diverticulitis of intestine, part unspecified, with perforation and abscess without bleeding: Secondary | ICD-10-CM | POA: Diagnosis not present

## 2014-05-25 DIAGNOSIS — Z432 Encounter for attention to ileostomy: Secondary | ICD-10-CM | POA: Diagnosis not present

## 2014-05-25 DIAGNOSIS — I2510 Atherosclerotic heart disease of native coronary artery without angina pectoris: Secondary | ICD-10-CM | POA: Diagnosis not present

## 2014-05-26 DIAGNOSIS — Z48815 Encounter for surgical aftercare following surgery on the digestive system: Secondary | ICD-10-CM | POA: Diagnosis not present

## 2014-05-26 DIAGNOSIS — K5732 Diverticulitis of large intestine without perforation or abscess without bleeding: Secondary | ICD-10-CM | POA: Diagnosis not present

## 2014-05-26 DIAGNOSIS — K219 Gastro-esophageal reflux disease without esophagitis: Secondary | ICD-10-CM | POA: Diagnosis not present

## 2014-05-26 DIAGNOSIS — Z432 Encounter for attention to ileostomy: Secondary | ICD-10-CM | POA: Diagnosis not present

## 2014-05-26 DIAGNOSIS — K5780 Diverticulitis of intestine, part unspecified, with perforation and abscess without bleeding: Secondary | ICD-10-CM | POA: Diagnosis not present

## 2014-05-26 DIAGNOSIS — I2510 Atherosclerotic heart disease of native coronary artery without angina pectoris: Secondary | ICD-10-CM | POA: Diagnosis not present

## 2014-05-26 DIAGNOSIS — E119 Type 2 diabetes mellitus without complications: Secondary | ICD-10-CM | POA: Diagnosis not present

## 2014-05-27 DIAGNOSIS — K219 Gastro-esophageal reflux disease without esophagitis: Secondary | ICD-10-CM | POA: Diagnosis not present

## 2014-05-27 DIAGNOSIS — Z432 Encounter for attention to ileostomy: Secondary | ICD-10-CM | POA: Diagnosis not present

## 2014-05-27 DIAGNOSIS — Z48815 Encounter for surgical aftercare following surgery on the digestive system: Secondary | ICD-10-CM | POA: Diagnosis not present

## 2014-05-27 DIAGNOSIS — I2510 Atherosclerotic heart disease of native coronary artery without angina pectoris: Secondary | ICD-10-CM | POA: Diagnosis not present

## 2014-05-27 DIAGNOSIS — E119 Type 2 diabetes mellitus without complications: Secondary | ICD-10-CM | POA: Diagnosis not present

## 2014-05-27 DIAGNOSIS — K5780 Diverticulitis of intestine, part unspecified, with perforation and abscess without bleeding: Secondary | ICD-10-CM | POA: Diagnosis not present

## 2014-06-01 DIAGNOSIS — Z432 Encounter for attention to ileostomy: Secondary | ICD-10-CM | POA: Diagnosis not present

## 2014-06-01 DIAGNOSIS — K219 Gastro-esophageal reflux disease without esophagitis: Secondary | ICD-10-CM | POA: Diagnosis not present

## 2014-06-01 DIAGNOSIS — Z48815 Encounter for surgical aftercare following surgery on the digestive system: Secondary | ICD-10-CM | POA: Diagnosis not present

## 2014-06-01 DIAGNOSIS — K5780 Diverticulitis of intestine, part unspecified, with perforation and abscess without bleeding: Secondary | ICD-10-CM | POA: Diagnosis not present

## 2014-06-01 DIAGNOSIS — E119 Type 2 diabetes mellitus without complications: Secondary | ICD-10-CM | POA: Diagnosis not present

## 2014-06-01 DIAGNOSIS — I2510 Atherosclerotic heart disease of native coronary artery without angina pectoris: Secondary | ICD-10-CM | POA: Diagnosis not present

## 2014-06-03 DIAGNOSIS — Z432 Encounter for attention to ileostomy: Secondary | ICD-10-CM | POA: Diagnosis not present

## 2014-06-03 DIAGNOSIS — K219 Gastro-esophageal reflux disease without esophagitis: Secondary | ICD-10-CM | POA: Diagnosis not present

## 2014-06-03 DIAGNOSIS — I2510 Atherosclerotic heart disease of native coronary artery without angina pectoris: Secondary | ICD-10-CM | POA: Diagnosis not present

## 2014-06-03 DIAGNOSIS — E119 Type 2 diabetes mellitus without complications: Secondary | ICD-10-CM | POA: Diagnosis not present

## 2014-06-03 DIAGNOSIS — Z48815 Encounter for surgical aftercare following surgery on the digestive system: Secondary | ICD-10-CM | POA: Diagnosis not present

## 2014-06-03 DIAGNOSIS — K5780 Diverticulitis of intestine, part unspecified, with perforation and abscess without bleeding: Secondary | ICD-10-CM | POA: Diagnosis not present

## 2014-06-05 ENCOUNTER — Ambulatory Visit: Payer: Self-pay | Admitting: Oncology

## 2014-06-05 DIAGNOSIS — I10 Essential (primary) hypertension: Secondary | ICD-10-CM | POA: Diagnosis not present

## 2014-06-05 DIAGNOSIS — M109 Gout, unspecified: Secondary | ICD-10-CM | POA: Diagnosis not present

## 2014-06-05 DIAGNOSIS — Z933 Colostomy status: Secondary | ICD-10-CM | POA: Diagnosis not present

## 2014-06-05 DIAGNOSIS — I2510 Atherosclerotic heart disease of native coronary artery without angina pectoris: Secondary | ICD-10-CM | POA: Diagnosis not present

## 2014-06-05 DIAGNOSIS — D649 Anemia, unspecified: Secondary | ICD-10-CM | POA: Diagnosis not present

## 2014-06-05 DIAGNOSIS — E119 Type 2 diabetes mellitus without complications: Secondary | ICD-10-CM | POA: Diagnosis not present

## 2014-06-05 DIAGNOSIS — E039 Hypothyroidism, unspecified: Secondary | ICD-10-CM | POA: Diagnosis not present

## 2014-06-05 DIAGNOSIS — Z9013 Acquired absence of bilateral breasts and nipples: Secondary | ICD-10-CM | POA: Diagnosis not present

## 2014-06-05 DIAGNOSIS — N289 Disorder of kidney and ureter, unspecified: Secondary | ICD-10-CM | POA: Diagnosis not present

## 2014-06-05 DIAGNOSIS — Z79899 Other long term (current) drug therapy: Secondary | ICD-10-CM | POA: Diagnosis not present

## 2014-06-05 DIAGNOSIS — Z170 Estrogen receptor positive status [ER+]: Secondary | ICD-10-CM | POA: Diagnosis not present

## 2014-06-05 DIAGNOSIS — Z853 Personal history of malignant neoplasm of breast: Secondary | ICD-10-CM | POA: Diagnosis not present

## 2014-06-05 LAB — COMPREHENSIVE METABOLIC PANEL
ALBUMIN: 3.3 g/dL — AB (ref 3.4–5.0)
ALT: 16 U/L
AST: 21 U/L (ref 15–37)
Alkaline Phosphatase: 111 U/L
Anion Gap: 7 (ref 7–16)
BILIRUBIN TOTAL: 0.5 mg/dL (ref 0.2–1.0)
BUN: 22 mg/dL — ABNORMAL HIGH (ref 7–18)
CALCIUM: 10.2 mg/dL — AB (ref 8.5–10.1)
Chloride: 99 mmol/L (ref 98–107)
Co2: 30 mmol/L (ref 21–32)
Creatinine: 1.99 mg/dL — ABNORMAL HIGH (ref 0.60–1.30)
EGFR (Non-African Amer.): 25 — ABNORMAL LOW
GFR CALC AF AMER: 30 — AB
Glucose: 121 mg/dL — ABNORMAL HIGH (ref 65–99)
OSMOLALITY: 277 (ref 275–301)
Potassium: 4.9 mmol/L (ref 3.5–5.1)
SODIUM: 136 mmol/L (ref 136–145)
TOTAL PROTEIN: 7.5 g/dL (ref 6.4–8.2)

## 2014-06-05 LAB — CBC CANCER CENTER
Basophil #: 0.1 x10 3/mm (ref 0.0–0.1)
Basophil %: 1.1 %
Eosinophil #: 0.1 x10 3/mm (ref 0.0–0.7)
Eosinophil %: 1.5 %
HCT: 33.4 % — AB (ref 35.0–47.0)
HGB: 10.5 g/dL — ABNORMAL LOW (ref 12.0–16.0)
LYMPHS ABS: 2 x10 3/mm (ref 1.0–3.6)
LYMPHS PCT: 25.9 %
MCH: 29.6 pg (ref 26.0–34.0)
MCHC: 31.5 g/dL — ABNORMAL LOW (ref 32.0–36.0)
MCV: 94 fL (ref 80–100)
MONO ABS: 0.7 x10 3/mm (ref 0.2–0.9)
Monocyte %: 8.7 %
NEUTROS ABS: 4.9 x10 3/mm (ref 1.4–6.5)
NEUTROS PCT: 62.8 %
PLATELETS: 187 x10 3/mm (ref 150–440)
RBC: 3.56 10*6/uL — AB (ref 3.80–5.20)
RDW: 14.8 % — AB (ref 11.5–14.5)
WBC: 7.9 x10 3/mm (ref 3.6–11.0)

## 2014-06-06 IMAGING — CT CT MAXILLOFACIAL WITHOUT CONTRAST
4 of 9 series · 17 of 47 positions shown, 19 images · non-contrast
Comparison: 01/11/2013 CT head

CLINICAL DATA: Fall

EXAM:
CT HEAD WITHOUT CONTRAST
CT MAXILLOFACIAL WITHOUT CONTRAST
CT CERVICAL SPINE WITHOUT CONTRAST
TECHNIQUE: Multidetector CT imaging of the head, cervical spine, and
maxillofacial structures were performed using the standard protocol
without intravenous contrast. Multiplanar CT image reconstructions
of the cervical spine and maxillofacial structures were also
generated.

[Series 16: sagittal (id) · sagittal · 0.31mm/px · 1 of 76 slices shown]
[im 38/76  bone]
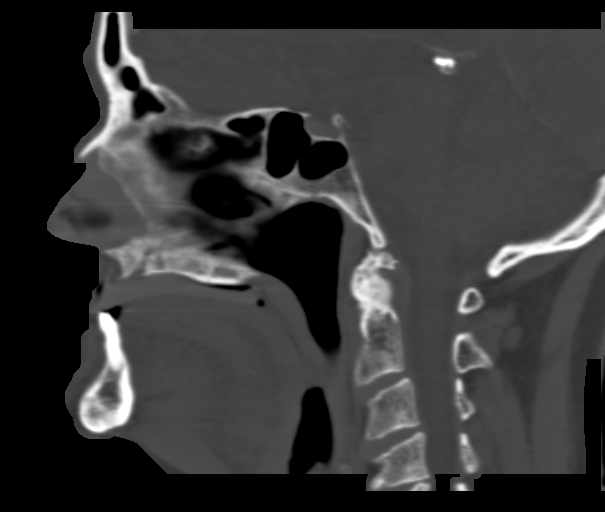

[Series 17: soft tissue · axial · 0.32mm/px · z∈[-312,-200]mm · 6 of 99 slices shown]
[im 11/99  brain]
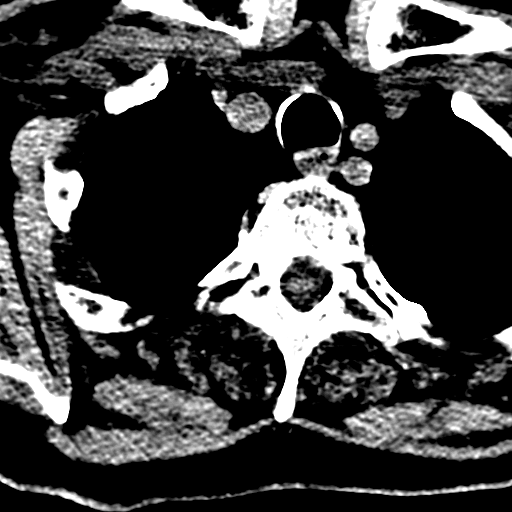
[im 22/99  brain]
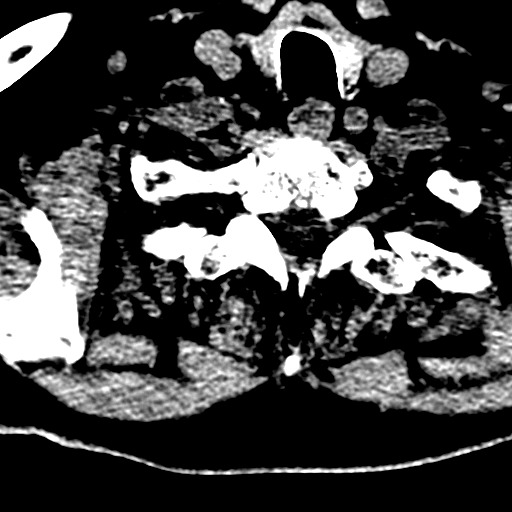
[im 33/99  brain]
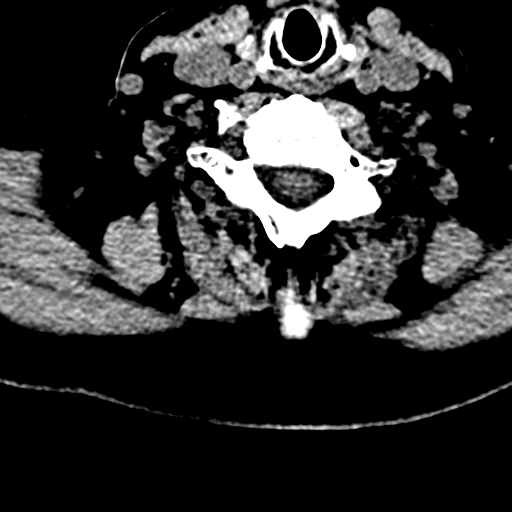
[im 44/99  brain]
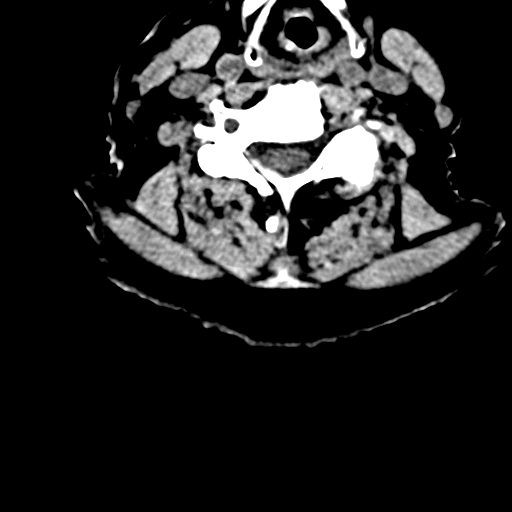
[im 55/99  brain]
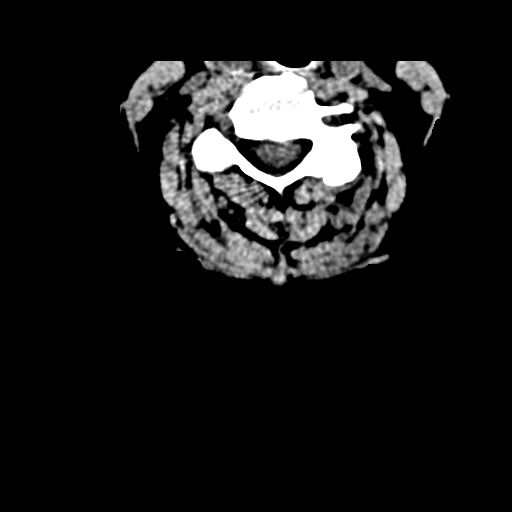
[im 66/99  brain]
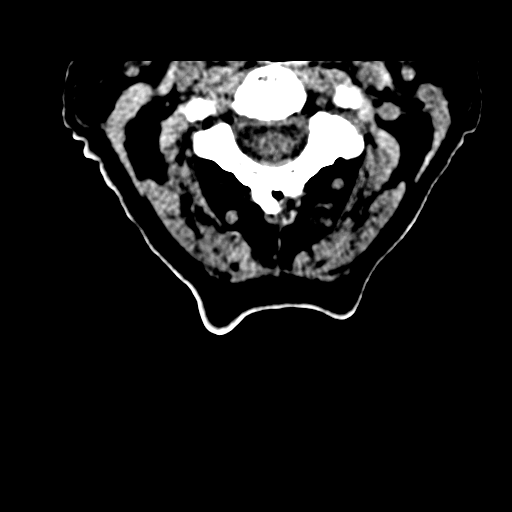

[Series 19: coronal bone · coronal · 0.39mm/px · 2 of 46 slices shown]
[im 16/46  bone]
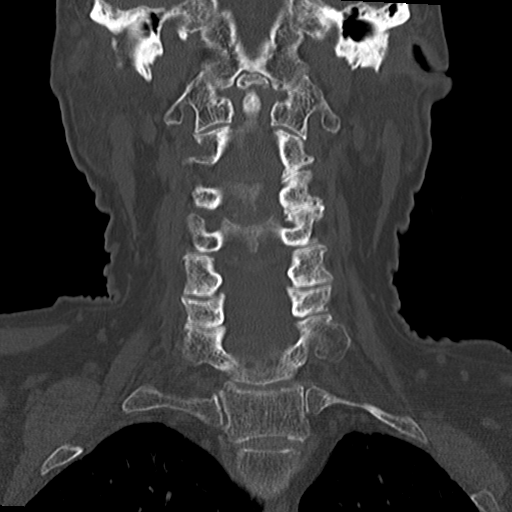
[im 31/46  bone]
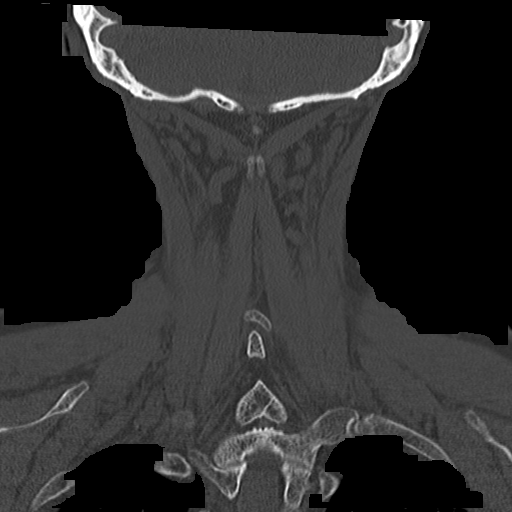

[Series 20: axial · axial · 0.24mm/px · z∈[-323,-174]mm · 8 of 99 slices shown, 10 images]
[im 11/99  brain]
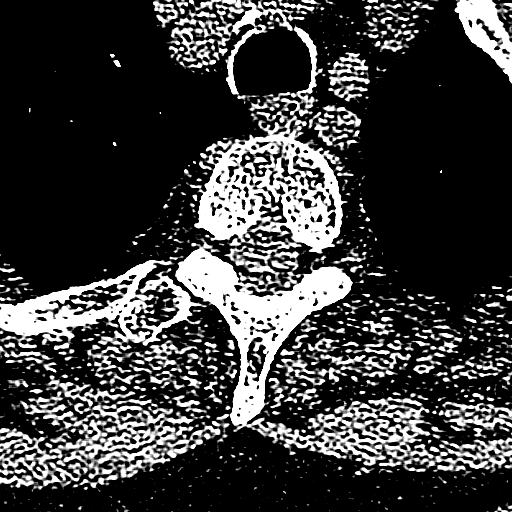
[im 11/99  bone]
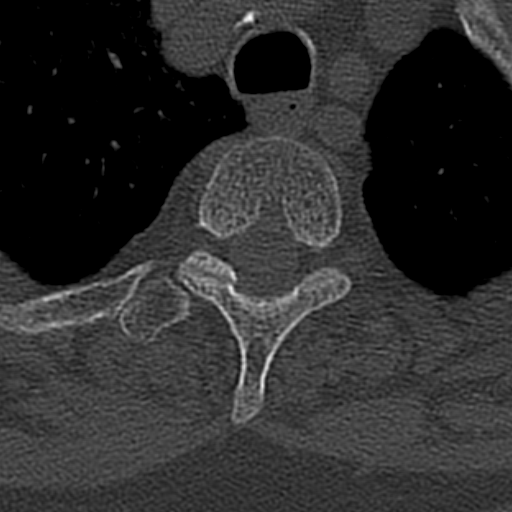
[im 22/99  bone]
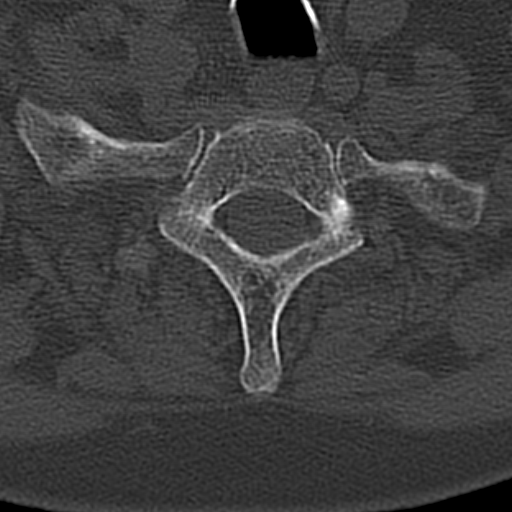
[im 33/99  bone]
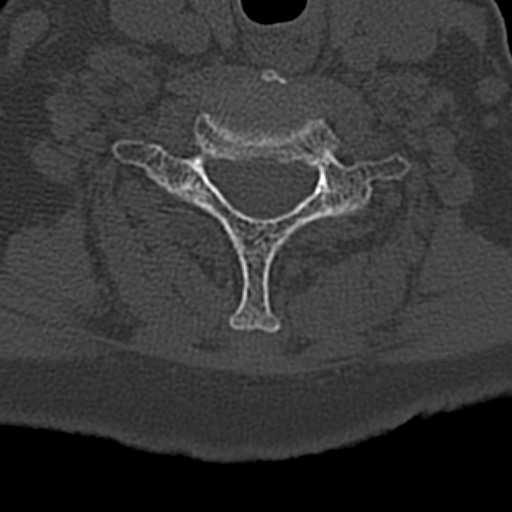
[im 44/99  bone]
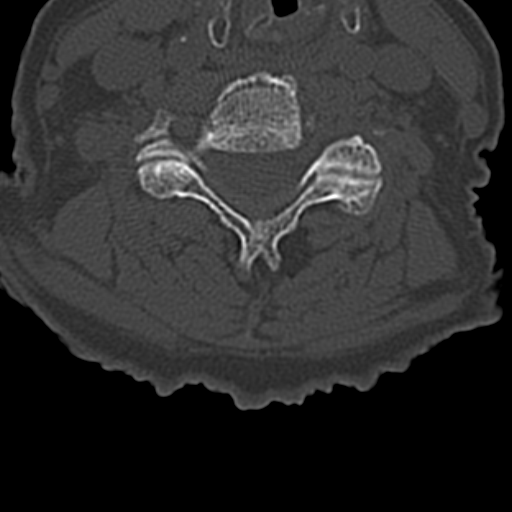
[im 55/99  brain]
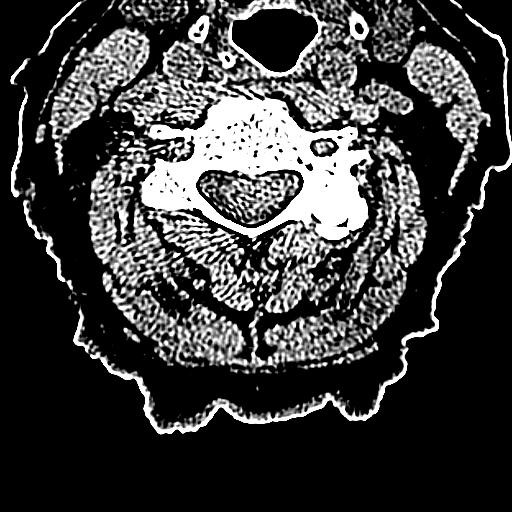
[im 55/99  bone]
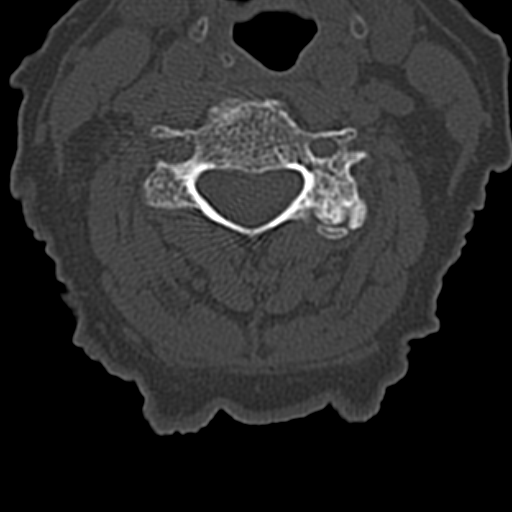
[im 66/99  bone]
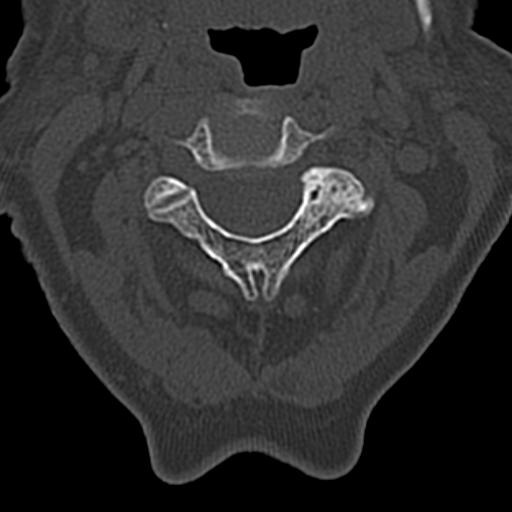
[im 77/99  bone]
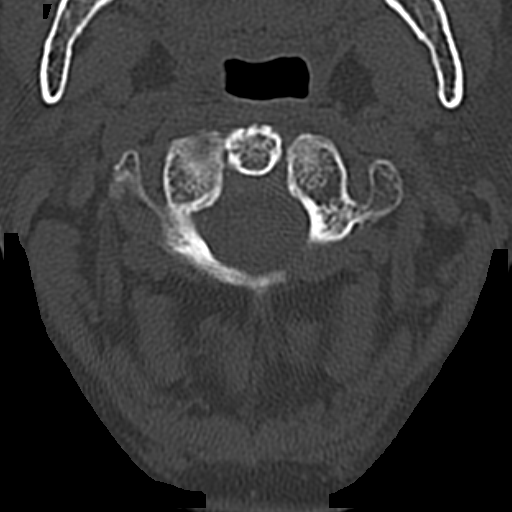
[im 88/99  bone]
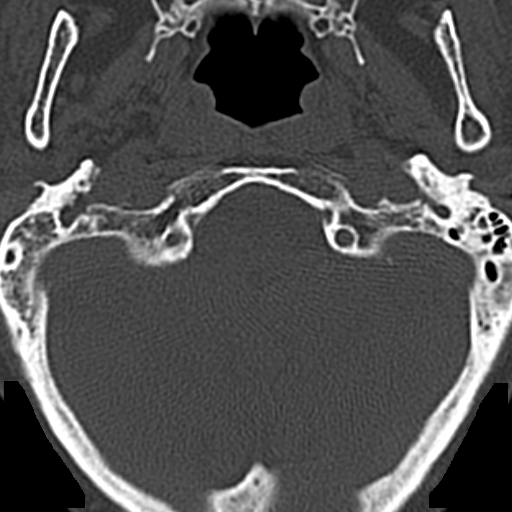

[17 of 47 positions shown; findings below may reference images not displayed]

FINDINGS: CT HEAD FINDINGS

Global atrophy. Chronic ischemic changes in the periventricular
white matter. No mass effect, midline shift, or acute intracranial
hemorrhage. Mastoid air cells are clear. Marked soft tissue swelling
over the left frontal bone and left orbit.

CT MAXILLOFACIAL FINDINGS

No acute fracture or dislocation in the facial bones. Soft tissue
swelling over the left frontal bone and left orbit is noted.

CT CERVICAL SPINE FINDINGS

No acute fracture or dislocation in the cervical spine. Degenerative
changes are scattered throughout. There is a focal central disc
protrusion indenting the cord at C4-5. Shallow left paracentral
protrusion at C3-4. Left-sided facet arthropathy occurs at C2-3 and
C3-4. Degenerative changes at the articulation between the dens and
anterior arch of C1 are prominent.
IMPRESSION: No acute intracranial pathology.

No acute injury in the facial bones.

No acute injury in the cervical spine.

Degenerative changes in the cervical spine.

Central disc herniation at C4-5 indents the cord.

## 2014-06-08 DIAGNOSIS — I2510 Atherosclerotic heart disease of native coronary artery without angina pectoris: Secondary | ICD-10-CM | POA: Diagnosis not present

## 2014-06-08 DIAGNOSIS — Z48815 Encounter for surgical aftercare following surgery on the digestive system: Secondary | ICD-10-CM | POA: Diagnosis not present

## 2014-06-08 DIAGNOSIS — Z432 Encounter for attention to ileostomy: Secondary | ICD-10-CM | POA: Diagnosis not present

## 2014-06-08 DIAGNOSIS — K219 Gastro-esophageal reflux disease without esophagitis: Secondary | ICD-10-CM | POA: Diagnosis not present

## 2014-06-08 DIAGNOSIS — K5780 Diverticulitis of intestine, part unspecified, with perforation and abscess without bleeding: Secondary | ICD-10-CM | POA: Diagnosis not present

## 2014-06-08 DIAGNOSIS — E119 Type 2 diabetes mellitus without complications: Secondary | ICD-10-CM | POA: Diagnosis not present

## 2014-06-09 DIAGNOSIS — I2510 Atherosclerotic heart disease of native coronary artery without angina pectoris: Secondary | ICD-10-CM | POA: Diagnosis not present

## 2014-06-09 DIAGNOSIS — Z48815 Encounter for surgical aftercare following surgery on the digestive system: Secondary | ICD-10-CM | POA: Diagnosis not present

## 2014-06-09 DIAGNOSIS — Z432 Encounter for attention to ileostomy: Secondary | ICD-10-CM | POA: Diagnosis not present

## 2014-06-09 DIAGNOSIS — K219 Gastro-esophageal reflux disease without esophagitis: Secondary | ICD-10-CM | POA: Diagnosis not present

## 2014-06-09 DIAGNOSIS — K5780 Diverticulitis of intestine, part unspecified, with perforation and abscess without bleeding: Secondary | ICD-10-CM | POA: Diagnosis not present

## 2014-06-09 DIAGNOSIS — E119 Type 2 diabetes mellitus without complications: Secondary | ICD-10-CM | POA: Diagnosis not present

## 2014-06-10 DIAGNOSIS — K5780 Diverticulitis of intestine, part unspecified, with perforation and abscess without bleeding: Secondary | ICD-10-CM | POA: Diagnosis not present

## 2014-06-10 DIAGNOSIS — E119 Type 2 diabetes mellitus without complications: Secondary | ICD-10-CM | POA: Diagnosis not present

## 2014-06-10 DIAGNOSIS — I2510 Atherosclerotic heart disease of native coronary artery without angina pectoris: Secondary | ICD-10-CM | POA: Diagnosis not present

## 2014-06-10 DIAGNOSIS — Z432 Encounter for attention to ileostomy: Secondary | ICD-10-CM | POA: Diagnosis not present

## 2014-06-10 DIAGNOSIS — Z48815 Encounter for surgical aftercare following surgery on the digestive system: Secondary | ICD-10-CM | POA: Diagnosis not present

## 2014-06-10 DIAGNOSIS — K219 Gastro-esophageal reflux disease without esophagitis: Secondary | ICD-10-CM | POA: Diagnosis not present

## 2014-06-16 DIAGNOSIS — E119 Type 2 diabetes mellitus without complications: Secondary | ICD-10-CM | POA: Diagnosis not present

## 2014-06-16 DIAGNOSIS — Z432 Encounter for attention to ileostomy: Secondary | ICD-10-CM | POA: Diagnosis not present

## 2014-06-16 DIAGNOSIS — I2510 Atherosclerotic heart disease of native coronary artery without angina pectoris: Secondary | ICD-10-CM | POA: Diagnosis not present

## 2014-06-16 DIAGNOSIS — K5780 Diverticulitis of intestine, part unspecified, with perforation and abscess without bleeding: Secondary | ICD-10-CM | POA: Diagnosis not present

## 2014-06-16 DIAGNOSIS — K219 Gastro-esophageal reflux disease without esophagitis: Secondary | ICD-10-CM | POA: Diagnosis not present

## 2014-06-16 DIAGNOSIS — Z48815 Encounter for surgical aftercare following surgery on the digestive system: Secondary | ICD-10-CM | POA: Diagnosis not present

## 2014-06-17 DIAGNOSIS — I6529 Occlusion and stenosis of unspecified carotid artery: Secondary | ICD-10-CM | POA: Insufficient documentation

## 2014-06-17 DIAGNOSIS — I10 Essential (primary) hypertension: Secondary | ICD-10-CM | POA: Diagnosis not present

## 2014-06-17 DIAGNOSIS — E780 Pure hypercholesterolemia: Secondary | ICD-10-CM | POA: Diagnosis not present

## 2014-06-17 DIAGNOSIS — I2510 Atherosclerotic heart disease of native coronary artery without angina pectoris: Secondary | ICD-10-CM | POA: Diagnosis not present

## 2014-06-17 DIAGNOSIS — I471 Supraventricular tachycardia: Secondary | ICD-10-CM | POA: Diagnosis not present

## 2014-06-18 DIAGNOSIS — E119 Type 2 diabetes mellitus without complications: Secondary | ICD-10-CM | POA: Diagnosis not present

## 2014-06-18 DIAGNOSIS — K219 Gastro-esophageal reflux disease without esophagitis: Secondary | ICD-10-CM | POA: Diagnosis not present

## 2014-06-18 DIAGNOSIS — Z432 Encounter for attention to ileostomy: Secondary | ICD-10-CM | POA: Diagnosis not present

## 2014-06-18 DIAGNOSIS — I2510 Atherosclerotic heart disease of native coronary artery without angina pectoris: Secondary | ICD-10-CM | POA: Diagnosis not present

## 2014-06-18 DIAGNOSIS — Z48815 Encounter for surgical aftercare following surgery on the digestive system: Secondary | ICD-10-CM | POA: Diagnosis not present

## 2014-06-18 DIAGNOSIS — K5780 Diverticulitis of intestine, part unspecified, with perforation and abscess without bleeding: Secondary | ICD-10-CM | POA: Diagnosis not present

## 2014-06-19 LAB — CANCER CENTER HEMOGLOBIN: HGB: 9.5 g/dL — AB (ref 12.0–16.0)

## 2014-06-23 DIAGNOSIS — I2510 Atherosclerotic heart disease of native coronary artery without angina pectoris: Secondary | ICD-10-CM | POA: Diagnosis not present

## 2014-06-23 DIAGNOSIS — I10 Essential (primary) hypertension: Secondary | ICD-10-CM | POA: Diagnosis not present

## 2014-06-23 DIAGNOSIS — I471 Supraventricular tachycardia: Secondary | ICD-10-CM | POA: Diagnosis not present

## 2014-06-24 DIAGNOSIS — R002 Palpitations: Secondary | ICD-10-CM | POA: Diagnosis not present

## 2014-07-01 DIAGNOSIS — E872 Acidosis: Secondary | ICD-10-CM | POA: Diagnosis not present

## 2014-07-01 DIAGNOSIS — N184 Chronic kidney disease, stage 4 (severe): Secondary | ICD-10-CM | POA: Diagnosis not present

## 2014-07-01 DIAGNOSIS — D631 Anemia in chronic kidney disease: Secondary | ICD-10-CM | POA: Diagnosis not present

## 2014-07-01 DIAGNOSIS — I10 Essential (primary) hypertension: Secondary | ICD-10-CM | POA: Diagnosis not present

## 2014-07-01 DIAGNOSIS — R809 Proteinuria, unspecified: Secondary | ICD-10-CM | POA: Diagnosis not present

## 2014-07-03 ENCOUNTER — Ambulatory Visit: Payer: Self-pay | Admitting: Oncology

## 2014-07-08 DIAGNOSIS — Z48815 Encounter for surgical aftercare following surgery on the digestive system: Secondary | ICD-10-CM | POA: Diagnosis not present

## 2014-07-08 DIAGNOSIS — E119 Type 2 diabetes mellitus without complications: Secondary | ICD-10-CM | POA: Diagnosis not present

## 2014-07-08 DIAGNOSIS — I2510 Atherosclerotic heart disease of native coronary artery without angina pectoris: Secondary | ICD-10-CM | POA: Diagnosis not present

## 2014-07-08 DIAGNOSIS — K219 Gastro-esophageal reflux disease without esophagitis: Secondary | ICD-10-CM | POA: Diagnosis not present

## 2014-07-08 DIAGNOSIS — Z432 Encounter for attention to ileostomy: Secondary | ICD-10-CM | POA: Diagnosis not present

## 2014-07-08 DIAGNOSIS — K5780 Diverticulitis of intestine, part unspecified, with perforation and abscess without bleeding: Secondary | ICD-10-CM | POA: Diagnosis not present

## 2014-07-09 ENCOUNTER — Ambulatory Visit: Payer: Self-pay | Admitting: Surgery

## 2014-07-09 DIAGNOSIS — Z01812 Encounter for preprocedural laboratory examination: Secondary | ICD-10-CM | POA: Diagnosis not present

## 2014-07-09 DIAGNOSIS — N189 Chronic kidney disease, unspecified: Secondary | ICD-10-CM | POA: Diagnosis not present

## 2014-07-09 DIAGNOSIS — D631 Anemia in chronic kidney disease: Secondary | ICD-10-CM | POA: Diagnosis not present

## 2014-07-09 DIAGNOSIS — R809 Proteinuria, unspecified: Secondary | ICD-10-CM | POA: Diagnosis not present

## 2014-07-09 DIAGNOSIS — I10 Essential (primary) hypertension: Secondary | ICD-10-CM | POA: Diagnosis not present

## 2014-07-09 DIAGNOSIS — N184 Chronic kidney disease, stage 4 (severe): Secondary | ICD-10-CM | POA: Diagnosis not present

## 2014-07-09 DIAGNOSIS — E119 Type 2 diabetes mellitus without complications: Secondary | ICD-10-CM | POA: Diagnosis not present

## 2014-07-09 DIAGNOSIS — E872 Acidosis: Secondary | ICD-10-CM | POA: Diagnosis not present

## 2014-07-09 LAB — CBC WITH DIFFERENTIAL/PLATELET
BASOS PCT: 0.8 %
Basophil #: 0.1 10*3/uL (ref 0.0–0.1)
EOS ABS: 0 10*3/uL (ref 0.0–0.7)
Eosinophil %: 0.1 %
HCT: 34.9 % — AB (ref 35.0–47.0)
HGB: 10.8 g/dL — AB (ref 12.0–16.0)
Lymphocyte #: 2.2 10*3/uL (ref 1.0–3.6)
Lymphocyte %: 33.7 %
MCH: 29.5 pg (ref 26.0–34.0)
MCHC: 31 g/dL — AB (ref 32.0–36.0)
MCV: 95 fL (ref 80–100)
Monocyte #: 0.6 x10 3/mm (ref 0.2–0.9)
Monocyte %: 9.8 %
NEUTROS PCT: 55.6 %
Neutrophil #: 3.5 10*3/uL (ref 1.4–6.5)
PLATELETS: 300 10*3/uL (ref 150–440)
RBC: 3.67 10*6/uL — AB (ref 3.80–5.20)
RDW: 15.8 % — ABNORMAL HIGH (ref 11.5–14.5)
WBC: 6.4 10*3/uL (ref 3.6–11.0)

## 2014-07-09 LAB — BASIC METABOLIC PANEL
ANION GAP: 6 — AB (ref 7–16)
BUN: 22 mg/dL — ABNORMAL HIGH (ref 7–18)
CALCIUM: 9.6 mg/dL (ref 8.5–10.1)
CHLORIDE: 101 mmol/L (ref 98–107)
CO2: 28 mmol/L (ref 21–32)
Creatinine: 1.89 mg/dL — ABNORMAL HIGH (ref 0.60–1.30)
EGFR (Non-African Amer.): 27 — ABNORMAL LOW
GFR CALC AF AMER: 32 — AB
Glucose: 115 mg/dL — ABNORMAL HIGH (ref 65–99)
OSMOLALITY: 274 (ref 275–301)
Potassium: 4.4 mmol/L (ref 3.5–5.1)
Sodium: 135 mmol/L — ABNORMAL LOW (ref 136–145)

## 2014-07-15 ENCOUNTER — Inpatient Hospital Stay: Payer: Self-pay | Admitting: Surgery

## 2014-07-15 DIAGNOSIS — N189 Chronic kidney disease, unspecified: Secondary | ICD-10-CM | POA: Diagnosis present

## 2014-07-15 DIAGNOSIS — N321 Vesicointestinal fistula: Secondary | ICD-10-CM | POA: Diagnosis present

## 2014-07-15 DIAGNOSIS — R410 Disorientation, unspecified: Secondary | ICD-10-CM | POA: Diagnosis not present

## 2014-07-15 DIAGNOSIS — Z432 Encounter for attention to ileostomy: Secondary | ICD-10-CM | POA: Diagnosis not present

## 2014-07-15 LAB — CREATININE, SERUM
Creatinine: 1.96 mg/dL — ABNORMAL HIGH (ref 0.60–1.30)
EGFR (African American): 31 — ABNORMAL LOW
GFR CALC NON AF AMER: 26 — AB

## 2014-07-16 LAB — BASIC METABOLIC PANEL
Anion Gap: 6 — ABNORMAL LOW (ref 7–16)
BUN: 24 mg/dL — ABNORMAL HIGH (ref 7–18)
CALCIUM: 9 mg/dL (ref 8.5–10.1)
CO2: 26 mmol/L (ref 21–32)
CREATININE: 2.1 mg/dL — AB (ref 0.60–1.30)
Chloride: 107 mmol/L (ref 98–107)
EGFR (African American): 29 — ABNORMAL LOW
GFR CALC NON AF AMER: 24 — AB
Glucose: 137 mg/dL — ABNORMAL HIGH (ref 65–99)
OSMOLALITY: 284 (ref 275–301)
POTASSIUM: 5.3 mmol/L — AB (ref 3.5–5.1)
SODIUM: 139 mmol/L (ref 136–145)

## 2014-07-16 LAB — CBC WITH DIFFERENTIAL/PLATELET
BASOS ABS: 0 10*3/uL (ref 0.0–0.1)
Basophil %: 0.2 %
Eosinophil #: 0 10*3/uL (ref 0.0–0.7)
Eosinophil %: 0 %
HCT: 34.5 % — AB (ref 35.0–47.0)
HGB: 10.8 g/dL — AB (ref 12.0–16.0)
LYMPHS PCT: 7.3 %
Lymphocyte #: 1.1 10*3/uL (ref 1.0–3.6)
MCH: 29.3 pg (ref 26.0–34.0)
MCHC: 31.2 g/dL — ABNORMAL LOW (ref 32.0–36.0)
MCV: 94 fL (ref 80–100)
MONOS PCT: 4.7 %
Monocyte #: 0.7 x10 3/mm (ref 0.2–0.9)
NEUTROS ABS: 13.3 10*3/uL — AB (ref 1.4–6.5)
Neutrophil %: 87.8 %
Platelet: 222 10*3/uL (ref 150–440)
RBC: 3.67 10*6/uL — ABNORMAL LOW (ref 3.80–5.20)
RDW: 15.6 % — AB (ref 11.5–14.5)
WBC: 15.2 10*3/uL — AB (ref 3.6–11.0)

## 2014-07-17 LAB — CBC WITH DIFFERENTIAL/PLATELET
BASOS ABS: 0 10*3/uL (ref 0.0–0.1)
Basophil %: 0.5 %
Eosinophil #: 0 10*3/uL (ref 0.0–0.7)
Eosinophil %: 0.5 %
HCT: 33.1 % — ABNORMAL LOW (ref 35.0–47.0)
HGB: 10.5 g/dL — ABNORMAL LOW (ref 12.0–16.0)
LYMPHS ABS: 2.3 10*3/uL (ref 1.0–3.6)
Lymphocyte %: 23 %
MCH: 29.7 pg (ref 26.0–34.0)
MCHC: 31.6 g/dL — ABNORMAL LOW (ref 32.0–36.0)
MCV: 94 fL (ref 80–100)
MONO ABS: 0.9 x10 3/mm (ref 0.2–0.9)
Monocyte %: 8.9 %
Neutrophil #: 6.7 10*3/uL — ABNORMAL HIGH (ref 1.4–6.5)
Neutrophil %: 67.1 %
PLATELETS: 195 10*3/uL (ref 150–440)
RBC: 3.52 10*6/uL — ABNORMAL LOW (ref 3.80–5.20)
RDW: 15.6 % — AB (ref 11.5–14.5)
WBC: 10 10*3/uL (ref 3.6–11.0)

## 2014-07-17 LAB — BASIC METABOLIC PANEL
Anion Gap: 5 — ABNORMAL LOW (ref 7–16)
BUN: 24 mg/dL — ABNORMAL HIGH (ref 7–18)
CO2: 25 mmol/L (ref 21–32)
Calcium, Total: 8.7 mg/dL (ref 8.5–10.1)
Chloride: 104 mmol/L (ref 98–107)
Creatinine: 1.99 mg/dL — ABNORMAL HIGH (ref 0.60–1.30)
EGFR (African American): 30 — ABNORMAL LOW
EGFR (Non-African Amer.): 25 — ABNORMAL LOW
GLUCOSE: 47 mg/dL — AB (ref 65–99)
Osmolality: 269 (ref 275–301)
POTASSIUM: 4.5 mmol/L (ref 3.5–5.1)
SODIUM: 134 mmol/L — AB (ref 136–145)

## 2014-08-04 ENCOUNTER — Ambulatory Visit: Payer: Self-pay | Admitting: Oncology

## 2014-08-04 DIAGNOSIS — D649 Anemia, unspecified: Secondary | ICD-10-CM | POA: Diagnosis not present

## 2014-08-04 LAB — CANCER CENTER HEMOGLOBIN: HGB: 10.4 g/dL — AB (ref 12.0–16.0)

## 2014-08-30 ENCOUNTER — Inpatient Hospital Stay: Payer: Self-pay | Admitting: Internal Medicine

## 2014-08-30 DIAGNOSIS — Z853 Personal history of malignant neoplasm of breast: Secondary | ICD-10-CM | POA: Diagnosis not present

## 2014-08-30 DIAGNOSIS — E11649 Type 2 diabetes mellitus with hypoglycemia without coma: Secondary | ICD-10-CM | POA: Diagnosis present

## 2014-08-30 DIAGNOSIS — Z86711 Personal history of pulmonary embolism: Secondary | ICD-10-CM | POA: Diagnosis not present

## 2014-08-30 DIAGNOSIS — E039 Hypothyroidism, unspecified: Secondary | ICD-10-CM | POA: Diagnosis not present

## 2014-08-30 DIAGNOSIS — M6281 Muscle weakness (generalized): Secondary | ICD-10-CM | POA: Diagnosis not present

## 2014-08-30 DIAGNOSIS — R509 Fever, unspecified: Secondary | ICD-10-CM | POA: Diagnosis not present

## 2014-08-30 DIAGNOSIS — R6510 Systemic inflammatory response syndrome (SIRS) of non-infectious origin without acute organ dysfunction: Secondary | ICD-10-CM | POA: Diagnosis not present

## 2014-08-30 DIAGNOSIS — E161 Other hypoglycemia: Secondary | ICD-10-CM | POA: Diagnosis present

## 2014-08-30 DIAGNOSIS — F0391 Unspecified dementia with behavioral disturbance: Secondary | ICD-10-CM | POA: Diagnosis present

## 2014-08-30 DIAGNOSIS — G9349 Other encephalopathy: Secondary | ICD-10-CM | POA: Diagnosis not present

## 2014-08-30 DIAGNOSIS — Z515 Encounter for palliative care: Secondary | ICD-10-CM | POA: Diagnosis not present

## 2014-08-30 DIAGNOSIS — D631 Anemia in chronic kidney disease: Secondary | ICD-10-CM | POA: Diagnosis not present

## 2014-08-30 DIAGNOSIS — M810 Age-related osteoporosis without current pathological fracture: Secondary | ICD-10-CM | POA: Diagnosis present

## 2014-08-30 DIAGNOSIS — R4182 Altered mental status, unspecified: Secondary | ICD-10-CM | POA: Diagnosis not present

## 2014-08-30 DIAGNOSIS — Z86718 Personal history of other venous thrombosis and embolism: Secondary | ICD-10-CM | POA: Diagnosis not present

## 2014-08-30 DIAGNOSIS — R451 Restlessness and agitation: Secondary | ICD-10-CM | POA: Diagnosis present

## 2014-08-30 DIAGNOSIS — Z955 Presence of coronary angioplasty implant and graft: Secondary | ICD-10-CM | POA: Diagnosis not present

## 2014-08-30 DIAGNOSIS — E119 Type 2 diabetes mellitus without complications: Secondary | ICD-10-CM | POA: Diagnosis not present

## 2014-08-30 DIAGNOSIS — M109 Gout, unspecified: Secondary | ICD-10-CM | POA: Diagnosis present

## 2014-08-30 DIAGNOSIS — R531 Weakness: Secondary | ICD-10-CM | POA: Diagnosis not present

## 2014-08-30 DIAGNOSIS — R2681 Unsteadiness on feet: Secondary | ICD-10-CM | POA: Diagnosis not present

## 2014-08-30 DIAGNOSIS — K219 Gastro-esophageal reflux disease without esophagitis: Secondary | ICD-10-CM | POA: Diagnosis not present

## 2014-08-30 DIAGNOSIS — I129 Hypertensive chronic kidney disease with stage 1 through stage 4 chronic kidney disease, or unspecified chronic kidney disease: Secondary | ICD-10-CM | POA: Diagnosis not present

## 2014-08-30 DIAGNOSIS — R410 Disorientation, unspecified: Secondary | ICD-10-CM | POA: Diagnosis not present

## 2014-08-30 DIAGNOSIS — N189 Chronic kidney disease, unspecified: Secondary | ICD-10-CM | POA: Diagnosis not present

## 2014-08-30 DIAGNOSIS — I2510 Atherosclerotic heart disease of native coronary artery without angina pectoris: Secondary | ICD-10-CM | POA: Diagnosis present

## 2014-08-30 DIAGNOSIS — E860 Dehydration: Secondary | ICD-10-CM | POA: Diagnosis not present

## 2014-08-30 DIAGNOSIS — I10 Essential (primary) hypertension: Secondary | ICD-10-CM | POA: Diagnosis not present

## 2014-08-30 DIAGNOSIS — E785 Hyperlipidemia, unspecified: Secondary | ICD-10-CM | POA: Diagnosis present

## 2014-08-30 DIAGNOSIS — F918 Other conduct disorders: Secondary | ICD-10-CM | POA: Diagnosis not present

## 2014-08-30 DIAGNOSIS — B349 Viral infection, unspecified: Secondary | ICD-10-CM | POA: Diagnosis not present

## 2014-08-30 DIAGNOSIS — F05 Delirium due to known physiological condition: Secondary | ICD-10-CM | POA: Diagnosis not present

## 2014-08-30 DIAGNOSIS — Z87891 Personal history of nicotine dependence: Secondary | ICD-10-CM | POA: Diagnosis not present

## 2014-08-30 DIAGNOSIS — Z8673 Personal history of transient ischemic attack (TIA), and cerebral infarction without residual deficits: Secondary | ICD-10-CM | POA: Diagnosis not present

## 2014-08-30 DIAGNOSIS — Z7982 Long term (current) use of aspirin: Secondary | ICD-10-CM | POA: Diagnosis not present

## 2014-08-30 DIAGNOSIS — E118 Type 2 diabetes mellitus with unspecified complications: Secondary | ICD-10-CM | POA: Diagnosis not present

## 2014-08-30 DIAGNOSIS — Z9013 Acquired absence of bilateral breasts and nipples: Secondary | ICD-10-CM | POA: Diagnosis present

## 2014-08-30 DIAGNOSIS — I6782 Cerebral ischemia: Secondary | ICD-10-CM | POA: Diagnosis not present

## 2014-08-30 DIAGNOSIS — N19 Unspecified kidney failure: Secondary | ICD-10-CM | POA: Diagnosis not present

## 2014-08-30 DIAGNOSIS — G9340 Encephalopathy, unspecified: Secondary | ICD-10-CM | POA: Diagnosis not present

## 2014-08-30 DIAGNOSIS — R262 Difficulty in walking, not elsewhere classified: Secondary | ICD-10-CM | POA: Diagnosis not present

## 2014-08-30 DIAGNOSIS — Z882 Allergy status to sulfonamides status: Secondary | ICD-10-CM | POA: Diagnosis not present

## 2014-08-30 DIAGNOSIS — I672 Cerebral atherosclerosis: Secondary | ICD-10-CM | POA: Diagnosis not present

## 2014-09-01 ENCOUNTER — Ambulatory Visit
Admit: 2014-09-01 | Disposition: A | Discharge status: Home / Self Care | Payer: Self-pay | Attending: Oncology | Admitting: Oncology

## 2014-09-04 ENCOUNTER — Ambulatory Visit: Payer: Self-pay | Admitting: Neurology

## 2014-09-04 DIAGNOSIS — R509 Fever, unspecified: Secondary | ICD-10-CM | POA: Diagnosis not present

## 2014-09-04 DIAGNOSIS — R262 Difficulty in walking, not elsewhere classified: Secondary | ICD-10-CM | POA: Diagnosis not present

## 2014-09-04 DIAGNOSIS — G9349 Other encephalopathy: Secondary | ICD-10-CM | POA: Diagnosis not present

## 2014-09-04 DIAGNOSIS — R4182 Altered mental status, unspecified: Secondary | ICD-10-CM | POA: Diagnosis not present

## 2014-09-04 DIAGNOSIS — R531 Weakness: Secondary | ICD-10-CM | POA: Diagnosis not present

## 2014-09-04 DIAGNOSIS — E119 Type 2 diabetes mellitus without complications: Secondary | ICD-10-CM | POA: Diagnosis not present

## 2014-09-04 DIAGNOSIS — M6281 Muscle weakness (generalized): Secondary | ICD-10-CM | POA: Diagnosis not present

## 2014-09-04 DIAGNOSIS — E860 Dehydration: Secondary | ICD-10-CM | POA: Diagnosis not present

## 2014-09-04 DIAGNOSIS — E039 Hypothyroidism, unspecified: Secondary | ICD-10-CM | POA: Diagnosis not present

## 2014-09-04 DIAGNOSIS — R2681 Unsteadiness on feet: Secondary | ICD-10-CM | POA: Diagnosis not present

## 2014-09-04 DIAGNOSIS — I2782 Chronic pulmonary embolism: Secondary | ICD-10-CM | POA: Diagnosis not present

## 2014-09-04 DIAGNOSIS — E118 Type 2 diabetes mellitus with unspecified complications: Secondary | ICD-10-CM | POA: Diagnosis not present

## 2014-09-04 DIAGNOSIS — R410 Disorientation, unspecified: Secondary | ICD-10-CM | POA: Diagnosis not present

## 2014-09-04 DIAGNOSIS — F05 Delirium due to known physiological condition: Secondary | ICD-10-CM | POA: Diagnosis not present

## 2014-09-04 DIAGNOSIS — G9340 Encephalopathy, unspecified: Secondary | ICD-10-CM | POA: Diagnosis not present

## 2014-09-04 DIAGNOSIS — G309 Alzheimer's disease, unspecified: Secondary | ICD-10-CM | POA: Diagnosis not present

## 2014-09-04 DIAGNOSIS — K219 Gastro-esophageal reflux disease without esophagitis: Secondary | ICD-10-CM | POA: Diagnosis not present

## 2014-09-04 DIAGNOSIS — I10 Essential (primary) hypertension: Secondary | ICD-10-CM | POA: Diagnosis not present

## 2014-09-08 DIAGNOSIS — E860 Dehydration: Secondary | ICD-10-CM | POA: Diagnosis not present

## 2014-09-08 DIAGNOSIS — E119 Type 2 diabetes mellitus without complications: Secondary | ICD-10-CM | POA: Diagnosis not present

## 2014-09-08 DIAGNOSIS — I2782 Chronic pulmonary embolism: Secondary | ICD-10-CM | POA: Diagnosis not present

## 2014-09-08 DIAGNOSIS — I10 Essential (primary) hypertension: Secondary | ICD-10-CM | POA: Diagnosis not present

## 2014-09-08 DIAGNOSIS — E039 Hypothyroidism, unspecified: Secondary | ICD-10-CM | POA: Diagnosis not present

## 2014-09-08 DIAGNOSIS — G309 Alzheimer's disease, unspecified: Secondary | ICD-10-CM | POA: Diagnosis not present

## 2014-09-08 DIAGNOSIS — R4182 Altered mental status, unspecified: Secondary | ICD-10-CM | POA: Diagnosis not present

## 2014-09-22 DIAGNOSIS — R4182 Altered mental status, unspecified: Secondary | ICD-10-CM | POA: Diagnosis not present

## 2014-09-23 DIAGNOSIS — E119 Type 2 diabetes mellitus without complications: Secondary | ICD-10-CM | POA: Diagnosis not present

## 2014-09-23 DIAGNOSIS — E780 Pure hypercholesterolemia: Secondary | ICD-10-CM | POA: Diagnosis not present

## 2014-09-23 DIAGNOSIS — E039 Hypothyroidism, unspecified: Secondary | ICD-10-CM | POA: Diagnosis not present

## 2014-09-23 DIAGNOSIS — I10 Essential (primary) hypertension: Secondary | ICD-10-CM | POA: Diagnosis not present

## 2014-09-24 DIAGNOSIS — Z86711 Personal history of pulmonary embolism: Secondary | ICD-10-CM | POA: Diagnosis not present

## 2014-09-24 DIAGNOSIS — G9340 Encephalopathy, unspecified: Secondary | ICD-10-CM | POA: Diagnosis not present

## 2014-09-24 DIAGNOSIS — Z86718 Personal history of other venous thrombosis and embolism: Secondary | ICD-10-CM | POA: Diagnosis not present

## 2014-09-24 DIAGNOSIS — I129 Hypertensive chronic kidney disease with stage 1 through stage 4 chronic kidney disease, or unspecified chronic kidney disease: Secondary | ICD-10-CM | POA: Diagnosis not present

## 2014-09-24 DIAGNOSIS — N189 Chronic kidney disease, unspecified: Secondary | ICD-10-CM | POA: Diagnosis not present

## 2014-09-24 DIAGNOSIS — Z853 Personal history of malignant neoplasm of breast: Secondary | ICD-10-CM | POA: Diagnosis not present

## 2014-09-24 DIAGNOSIS — E039 Hypothyroidism, unspecified: Secondary | ICD-10-CM | POA: Diagnosis not present

## 2014-09-24 DIAGNOSIS — K5190 Ulcerative colitis, unspecified, without complications: Secondary | ICD-10-CM | POA: Diagnosis not present

## 2014-09-24 DIAGNOSIS — Z8673 Personal history of transient ischemic attack (TIA), and cerebral infarction without residual deficits: Secondary | ICD-10-CM | POA: Diagnosis not present

## 2014-09-24 DIAGNOSIS — E119 Type 2 diabetes mellitus without complications: Secondary | ICD-10-CM | POA: Diagnosis not present

## 2014-09-24 DIAGNOSIS — F0390 Unspecified dementia without behavioral disturbance: Secondary | ICD-10-CM | POA: Diagnosis not present

## 2014-09-28 DIAGNOSIS — N19 Unspecified kidney failure: Secondary | ICD-10-CM | POA: Diagnosis not present

## 2014-09-28 DIAGNOSIS — Z933 Colostomy status: Secondary | ICD-10-CM | POA: Diagnosis not present

## 2014-09-28 DIAGNOSIS — E119 Type 2 diabetes mellitus without complications: Secondary | ICD-10-CM | POA: Diagnosis not present

## 2014-09-28 DIAGNOSIS — D631 Anemia in chronic kidney disease: Secondary | ICD-10-CM | POA: Diagnosis not present

## 2014-09-28 DIAGNOSIS — Z170 Estrogen receptor positive status [ER+]: Secondary | ICD-10-CM | POA: Diagnosis not present

## 2014-09-28 DIAGNOSIS — Z79899 Other long term (current) drug therapy: Secondary | ICD-10-CM | POA: Diagnosis not present

## 2014-09-28 DIAGNOSIS — Z853 Personal history of malignant neoplasm of breast: Secondary | ICD-10-CM | POA: Diagnosis not present

## 2014-09-28 DIAGNOSIS — E039 Hypothyroidism, unspecified: Secondary | ICD-10-CM | POA: Diagnosis not present

## 2014-09-28 DIAGNOSIS — I10 Essential (primary) hypertension: Secondary | ICD-10-CM | POA: Diagnosis not present

## 2014-09-28 DIAGNOSIS — F0390 Unspecified dementia without behavioral disturbance: Secondary | ICD-10-CM | POA: Diagnosis not present

## 2014-09-28 DIAGNOSIS — Z9013 Acquired absence of bilateral breasts and nipples: Secondary | ICD-10-CM | POA: Diagnosis not present

## 2014-09-28 DIAGNOSIS — I2510 Atherosclerotic heart disease of native coronary artery without angina pectoris: Secondary | ICD-10-CM | POA: Diagnosis not present

## 2014-09-28 LAB — CBC CANCER CENTER
BASOS ABS: 0 x10 3/mm (ref 0.0–0.1)
Basophil %: 0.7 %
EOS PCT: 1.9 %
Eosinophil #: 0.1 x10 3/mm (ref 0.0–0.7)
HCT: 36.3 % (ref 35.0–47.0)
HGB: 11.6 g/dL — ABNORMAL LOW (ref 12.0–16.0)
LYMPHS ABS: 2.1 x10 3/mm (ref 1.0–3.6)
Lymphocyte %: 32.5 %
MCH: 28.3 pg (ref 26.0–34.0)
MCHC: 32 g/dL (ref 32.0–36.0)
MCV: 89 fL (ref 80–100)
MONOS PCT: 7.8 %
Monocyte #: 0.5 x10 3/mm (ref 0.2–0.9)
Neutrophil #: 3.8 x10 3/mm (ref 1.4–6.5)
Neutrophil %: 57.1 %
Platelet: 242 x10 3/mm (ref 150–440)
RBC: 4.1 10*6/uL (ref 3.80–5.20)
RDW: 16.3 % — AB (ref 11.5–14.5)
WBC: 6.6 x10 3/mm (ref 3.6–11.0)

## 2014-09-28 LAB — COMPREHENSIVE METABOLIC PANEL
ALK PHOS: 84 U/L
AST: 19 U/L
Albumin: 3.6 g/dL
Anion Gap: 4 — ABNORMAL LOW (ref 7–16)
BUN: 22 mg/dL — AB
Bilirubin,Total: 0.5 mg/dL
CREATININE: 1.46 mg/dL — AB
Calcium, Total: 9.4 mg/dL
Chloride: 99 mmol/L — ABNORMAL LOW
Co2: 30 mmol/L
EGFR (African American): 37 — ABNORMAL LOW
EGFR (Non-African Amer.): 32 — ABNORMAL LOW
GLUCOSE: 122 mg/dL — AB
Potassium: 4.1 mmol/L
SGPT (ALT): 13 U/L — ABNORMAL LOW
Sodium: 133 mmol/L — ABNORMAL LOW
TOTAL PROTEIN: 7.2 g/dL

## 2014-09-29 DIAGNOSIS — G9340 Encephalopathy, unspecified: Secondary | ICD-10-CM | POA: Diagnosis not present

## 2014-09-29 DIAGNOSIS — I129 Hypertensive chronic kidney disease with stage 1 through stage 4 chronic kidney disease, or unspecified chronic kidney disease: Secondary | ICD-10-CM | POA: Diagnosis not present

## 2014-09-29 DIAGNOSIS — N189 Chronic kidney disease, unspecified: Secondary | ICD-10-CM | POA: Diagnosis not present

## 2014-09-29 DIAGNOSIS — K5190 Ulcerative colitis, unspecified, without complications: Secondary | ICD-10-CM | POA: Diagnosis not present

## 2014-09-29 DIAGNOSIS — F0390 Unspecified dementia without behavioral disturbance: Secondary | ICD-10-CM | POA: Diagnosis not present

## 2014-09-29 DIAGNOSIS — E119 Type 2 diabetes mellitus without complications: Secondary | ICD-10-CM | POA: Diagnosis not present

## 2014-10-01 DIAGNOSIS — R252 Cramp and spasm: Secondary | ICD-10-CM | POA: Diagnosis not present

## 2014-10-01 DIAGNOSIS — R51 Headache: Secondary | ICD-10-CM | POA: Diagnosis not present

## 2014-10-01 DIAGNOSIS — R42 Dizziness and giddiness: Secondary | ICD-10-CM | POA: Diagnosis not present

## 2014-10-02 ENCOUNTER — Ambulatory Visit
Admit: 2014-10-02 | Disposition: A | Discharge status: Home / Self Care | Payer: Self-pay | Attending: Oncology | Admitting: Oncology

## 2014-10-02 DIAGNOSIS — E119 Type 2 diabetes mellitus without complications: Secondary | ICD-10-CM | POA: Diagnosis not present

## 2014-10-02 DIAGNOSIS — F0390 Unspecified dementia without behavioral disturbance: Secondary | ICD-10-CM | POA: Diagnosis not present

## 2014-10-02 DIAGNOSIS — I129 Hypertensive chronic kidney disease with stage 1 through stage 4 chronic kidney disease, or unspecified chronic kidney disease: Secondary | ICD-10-CM | POA: Diagnosis not present

## 2014-10-02 DIAGNOSIS — G9340 Encephalopathy, unspecified: Secondary | ICD-10-CM | POA: Diagnosis not present

## 2014-10-02 DIAGNOSIS — N189 Chronic kidney disease, unspecified: Secondary | ICD-10-CM | POA: Diagnosis not present

## 2014-10-02 DIAGNOSIS — K5190 Ulcerative colitis, unspecified, without complications: Secondary | ICD-10-CM | POA: Diagnosis not present

## 2014-10-07 DIAGNOSIS — I129 Hypertensive chronic kidney disease with stage 1 through stage 4 chronic kidney disease, or unspecified chronic kidney disease: Secondary | ICD-10-CM | POA: Diagnosis not present

## 2014-10-07 DIAGNOSIS — N189 Chronic kidney disease, unspecified: Secondary | ICD-10-CM | POA: Diagnosis not present

## 2014-10-07 DIAGNOSIS — F0390 Unspecified dementia without behavioral disturbance: Secondary | ICD-10-CM | POA: Diagnosis not present

## 2014-10-07 DIAGNOSIS — K5190 Ulcerative colitis, unspecified, without complications: Secondary | ICD-10-CM | POA: Diagnosis not present

## 2014-10-07 DIAGNOSIS — G9340 Encephalopathy, unspecified: Secondary | ICD-10-CM | POA: Diagnosis not present

## 2014-10-07 DIAGNOSIS — E119 Type 2 diabetes mellitus without complications: Secondary | ICD-10-CM | POA: Diagnosis not present

## 2014-10-21 DIAGNOSIS — I129 Hypertensive chronic kidney disease with stage 1 through stage 4 chronic kidney disease, or unspecified chronic kidney disease: Secondary | ICD-10-CM | POA: Diagnosis not present

## 2014-10-21 DIAGNOSIS — E119 Type 2 diabetes mellitus without complications: Secondary | ICD-10-CM | POA: Diagnosis not present

## 2014-10-21 DIAGNOSIS — N189 Chronic kidney disease, unspecified: Secondary | ICD-10-CM | POA: Diagnosis not present

## 2014-10-21 DIAGNOSIS — G9340 Encephalopathy, unspecified: Secondary | ICD-10-CM | POA: Diagnosis not present

## 2014-10-21 DIAGNOSIS — F0390 Unspecified dementia without behavioral disturbance: Secondary | ICD-10-CM | POA: Diagnosis not present

## 2014-10-21 DIAGNOSIS — K5190 Ulcerative colitis, unspecified, without complications: Secondary | ICD-10-CM | POA: Diagnosis not present

## 2014-10-23 NOTE — Discharge Summary (Signed)
PATIENT NAME:  Mariah Watkins, Mariah Watkins MR#:  [id number] DATE OF BIRTH:  1922-07-26  DATE OF ADMISSION:  01/11/2013  DATE OF DISCHARGE:  01/12/2013  ADMITTING DIAGNOSIS:  Syncope.   DISCHARGE DIAGNOSES: 1.  Syncope, likely due to hypotension.  2.  Hypotension, likely due to dehydration as well as blood pressure medications. 3. History of hypertension, diabetes mellitus, hemoglobin A1c 6.6, history of coronary artery disease, hyperlipidemia, gout, breast carcinoma, anemia of chronic disease, CKD, colitis, hypothyroidism.  4.  Suspected overactive bladder.  DISCHARGE CONDITION:  Stable.   DISCHARGE MEDICATIONS:  1.  The patient is to resume aspirin 81 mg p.o. daily. 2.  Caltrate 600 with vitamin D 1 tablet twice daily. 3.  Ferro/sequels 150 mg/40 mg oral tablet 1 tablet once daily.  4.  Glyburide 2.5 mg p.o. daily.  5.  Nitroglycerin 0.4 mg sublingually every 5 minutes as needed.  6.  Synthroid 100 mcg p.o. daily.  7.  Sodium bicarbonate 250 mg p.o. twice daily.  8.  Crestor 10 mg p.o. once daily.  9.  Allopurinol 100 mg p.o. once daily.  10.  Asacol 400 mg p.o. once daily.  11. Tylenol 500 mg p.o. once daily as needed. 12.  Aranesp 1 injection every 3 weeks as needed.  13.  Exforge 10/320, 1/2 tablet once a day. This is new dosing. In the past, patient was on full dose, 1 tablet once a day, now it is only 1/2 tablet.  14.  Oxybutynin extended release 5 mg in 24 hours, 1 tablet at bedtime.   HOME OXYGEN:  None.   DIET:  2 grams salt, low fat, low cholesterol, carbohydrate-controlled diet, regular consistency.   ACTIVITY LIMITATIONS: As tolerated.   FOLLOW UP APPOINTMENT:  With Dr. Ilene Qua in 2 days after discharge. The patient was also advised to follow blood pressure with half dose of Exforge, and discontinue this medication altogether if blood pressure remains somewhat low.   CONSULTANTS:  Care Management.   RADIOLOGIC STUDIES:  Chest x-ray PA and lateral 01/11/2013 revealed minimal  atelectasis, no definite acute cardiopulmonary disease otherwise. CT scan of head without contrast 01/11/2013 showed changes of atrophy with chronic microvascular ischemic disease. No acute intracranial abnormalities, significant change noted. Ultrasound of carotid arteries 01/12/2013 revealed bilateral carotid atherosclerotic plaque, without evidence of hemodynamically significant stenosis. Echocardiogram was ordered; however, it was not  obtained at this time.   REASON FOR ADMISSION:  The patient is an 81 year old Caucasian female with history of hypertension, hyperlipidemia and diabetes, who presents to the hospital with hypotension as well as a syncopal episode. Please refer to Dr. Loma Sender admission on 01/11/2013. On arrival to the hospital, patient's blood pressure was around 120s. However, patient's family reported that in the morning, patient's blood pressure was as low as 90s over 70s, and it has been running low over a period of time now. The patient was admitted to the hospital with diagnosis of presyncope, which was felt to be due to hypotension which, in fact, was felt to be related to some element of dehydration as well as blood pressure medication.   LAB DATA:  On the day of admission revealed elevation of BUN and creatinine to 41 and 2.38, respectively. Her potassium level was also elevated at 5.4, glucose 124, otherwise BMP was unremarkable. The patient's liver enzymes were normal. Cardiac enzymes x 3 were within normal limits. TSH was high at 5.61. However patient's free thyroxine was normal at 1.3. CBC: White blood cell count 8.3, hemoglobin 9.9, platelet  count 225. Urinalysis was unremarkable. The patient's urine cultures showed colonies too small to read.   HOSPITAL COURSE:  The patient was admitted to the hospital for further evaluation. She was rehydrated, and her blood pressure improved. Her blood pressure medication was placed on hold. On day of discharge, 01/12/2013,   patient's blood pressure is ranging from 891Q to 945W systolic and 38U diastolic. It was felt that patient's hypotension was responsible for her episodes of presyncope, and hypotension was, in fact, due to some element of dehydration as well as blood pressure medication. The patient's blood pressure medications were cut in half. The patient was advised to continue Exforge; however, only half of her usual dose. The patient was advised also to follow her blood pressure readings very closely, and discontinue this medication if her blood pressure remains somewhat low. Carotid ultrasound was performed, it was unremarkable. Echocardiogram was also performed; however, results are still pending at the time of dictation. If the patient's echocardiogram is unremarkable, patient will likely be discharged home today.  In regards to hypertension, she had episodes of hypertension as mentioned above. The patient was restarted on blood pressure medication; however, at only half dose of previously-used medication dose.   In regards to diabetes mellitus, the patient's hemoglobin A1c was checked and was found to be only 6.6. The patient was advised to continue her medication doses. However, it was felt that patient could be intermittently hypoglycemic, and patient was recommended to check her blood glucose levels if she becomes lightheaded or dizzy again. Her blood glucose levels were checked very closely while she was in the hospital, and patient's blood glucose was found to be 75 in the morning at 7:00 a.m. on 01/12/2013.   Regarding acute on chronic renal failure, patient's kidney function was found to be somewhat abnormal. However, with IV fluid rehydration, patient's creatinine improved and was back to her baseline. On 01/12/2013, patient's BUN and creatinine were 39 and 2.07, which is about as previously noted in prior studies, 2.07 on 12/06/2012.   Regarding coronary artery disease, patient's coronary artery disease was  stable. There was no chest pain. The patient was advised to continue current management and follow up with her cardiologist as well as primary care physician.   The patient was complaining of some urge urinary incontinence. She had a bladder scan done, which showed approximately 300 mL of urine. The patient was started on Ditropan at night at 5 mg daily. It is recommended to advance patient's medication if needed, especially if she is able to tolerate this medication.    On the day of discharge, the patient (Dictation Anomaly) had no significant discomfort. She is being discharged home with the above-mentioned medications and followup. Her vital signs were temperature of 97.3 to 98, pulse was (Dictation Anomaly) 75 to 85 F, respiratory rate 18 to 20, blood pressure ranging from 828M to 034J systolic and 17H diastolic, O2 sats was 15% to 97% on room air at rest.   TIME SPENT:  40 minutes.    ____________________________ Theodoro Grist, MD rv:mr D: 01/12/2013 13:27:08 ET T: 01/12/2013 19:11:04 ET JOB#: [id number]  cc: Theodoro Grist, MD, <Dictator> Fonnie Jarvis. Ilene Qua, MD  Theodoro Grist MD ELECTRONICALLY SIGNED 01/23/2013 11:29

## 2014-10-23 NOTE — H&P (Signed)
PATIENT NAME:  Mariah Watkins, Mariah Watkins MR#:  [id number] DATE OF BIRTH:  1923-04-17  DATE OF ADMISSION:  01/11/2013  PRIMARY CARE PHYSICIAN:  Dr. Franky Macho  PRIMARY CARDIOLOGIST:  Dr. Serafina Royals.  REFERRING PHYSICIAN:  Loletta Specter, MD.   CHIEF COMPLAINT:  A presyncopal episode.   HISTORY OF PRESENT ILLNESS:  Mariah Watkins is a very nice 81 year old female who has history of multiple medical problems including coronary artery disease, diabetes, gout, breast cancer, chronic disease anemia, chronic kidney disease, a history of a nonspecific colitis, hypercholesterolemia. The patient presents to the ER with a history of being her normal self today and yesterday and previous days without any symptomatology at all up until this morning when she go for lunch with her daughter, coming back in the car, her vision is starting to turn very foggy and the patient almost passed out. She describes event as a dizzy spell. She states that she has dizzy spells on a regular basis in the past. Because of this, she was taken off her beta blocker, and she noticed a reduction of the events. At this moment, the patient states that she has been having them often, at least once or twice a month. The episode that happened today was a little bit more severe and involved her vision becoming really foggy. She did not have any significant headaches. She did not have any nausea or vomiting. No chest pain or shortness of breath at the moment of the events. She did not have any slurred speech or weakness in any part of her body. It was only blurry vision, feeling lightheaded and about to pass out. Her daughters brought her into the Emergency Department and here her vital signs were stable. Blood pressure was 129/71, although the daughter is telling me that her blood pressure was 90s/70s earlier today at home. The patient states that she is feeling back to her normal self, but she is afraid of this happening again. The patient is  admitted for evaluation of this problem. The event lasted for only seconds and it has not repeated back again. The patient states that she did eat normal breakfast and lunch, and she knows when her blood sugars come down, and they have any been doing it in a long time.   REVIEW OF SYSTEMS:  A 12-system review of systems is done.  CONSTITUTIONAL:  No fever, fatigue, weakness, weight loss or weight gain.  EYES:  No blurry vision, double vision. She has glaucoma surgery and laser retina surgery.  ENT:  No tinnitus. Positive hearing loss. No sinus pain. No postnasal drip.  RESPIRATORY:  No cough, wheezing, hemoptysis, COPD or painful respirations.  CARDIOVASCULAR:  The patient states that she occasionally has chest pain and takes nitroglycerin. They are chronic. No orthopnea, no edema, no arrhythmias, no palpitations, no previous syncopal episodes.  GASTROINTESTINAL:  No nausea, vomiting, abdominal pain, constipation, or diarrhea. No blood in stool.  GYNECOLOGIC:  Status post bilateral mastectomy due to breast cancer.  GENITOURINARY:  No dysuria, hematuria, or changes in frequency. The patient states a regular basis she urinates a lot at night.  ENDOCRINE:  No polyuria, polydipsia, polyphagia, cold or heat intolerance.  HEMATOLOGIC AND LYMPHATIC:  No anemia, easy bruising or swollen glands.  MUSCULOSKELETAL:  No significant pain on back, neck, shoulders. The patient has gout controlled on allopurinol.  NEUROLOGIC:  No numbness, tingling. The patient says that she has had multiple TIAs in the past. No vertigo. No ataxia. No dementia. Positive  occasional memory loss.  PSYCHIATRIC:  No significant agitation. No insomnia. No significant depression.   PAST MEDICAL HISTORY: 1.  Breast cancer. The patient had a T1 MO positive for estrogen and progesterone receptors. She has been out of Aromasin for over six months.  2.  Gout.  3.  Hypertension.  4.  Hypothyroidism.  5.  Irregular heart rate, not formal  diagnosis of arrhythmia. The patient had some supraventricular beats.  6.  A history of chronic angina. 7.  Coronary disease.  8.  Chronic kidney disease.  9.  Anemia of chronic disease.  10.  Glaucoma.  11.  Gout.  12.  Type 2 diabetes.  13.  Hypercholesterolemia.   ALLERGIES:  THE PATIENT HAS ALLERGY TO SULFAS, MAKE HER HAVE A RASH.   PAST SURGICAL HISTORY: 1.  Bilateral mastectomy. 2.  Cataract surgery.  3.  Laser retinal surgery.  4.  Multiple EGDs.  5.  Squamous cell carcinoma of the scalp, removed.   FAMILY HISTORY:  The patient had a brother who had lung cancer and some history of apparently high blood pressure and DVTs and diabetes.   SOCIAL HISTORY:  The patient used to smoke. She quit over 40 years ago. She lives in an independent living by herself. Does not drink alcohol.   MEDICATIONS AT DISCHARGE:  Allopurinol 100 mg once daily, aspirin 81 mg daily, Caltrate 600 mg plus vitamin D twice daily, Exforge 10 mg/320 mg once daily, ferrous sulfate 150 mg once a day, glipizide 2.5 mg daily, nitroglycerin as needed for chest pain, Synthroid 100 mcg once daily, sodium carbonate 650 mg, Crestor 10 mg once a day, Tylenol as needed for her pain or inflammation, Asacol 400 mg 3 times a day.  PHYSICAL EXAMINATION:  VITAL SIGNS:  Blood pressure 129/71, pulse 98, respirations 20, temperature 98, oxygen saturation 94% on room air.  GENERAL:  The patient is alert, oriented x 3. No acute distress. No respiratory distress. Hemodynamically stable.  HEENT:  Pupils are equal and reactive. Extraocular movements are intact. Mucosae are dry. No oral lesions. No thrush.  NECK:  Supple. No JVD. No thyromegaly, no adenopathy. No carotid bruits auscultated.  NOSE:  Normal without any lesions.  EARS:  Without any external lesions.  CARDIOVASCULAR:  Regular rate and rhythm, systolic ejection murmur 1/6. No displacement of PMI. No tenderness to palpation anterior chest wall.  LUNGS:  Clear without any  wheezing or crepitus. No use of accessory muscles.  ABDOMEN:  Soft, nontender, nondistended. No hepatosplenomegaly. No masses. Bowel sounds are positive. The patient is obese.  EXTREMITIES:  No cyanosis or clubbing. No significant edema at this moment.  VASCULAR:  Normal pulses with good capillary refill, less than 3 seconds.  MUSCULOSKELETAL:  Normal joints without any edema. No effusions.  SKIN:  Without any rashes or petechiae.  LYMPHATIC:  Negative for lymphadenopathy in the neck or supraclavicular areas.  NEUROLOGIC:  Cranial nerves II through XII intact. No focal deficits. No facial droop. The speech is normal, oriented, well directed. Her strength is equal in all 4 extremities, 4/5.  PSYCHIATRIC:  Negative for significant agitation or depression. Alert and oriented x 3 and cooperative.  LABORATORY, DIAGNOSTIC AND RADIOLOGICAL DATA:  A CT of the head as mentioned above. No significant acute abnormalities. Glucose 124, potassium is 5.4. Creatinine is 2.38, calcium 9.9. LFTs within normal limits. Troponin is negative in the first set. Hemoglobin is 9.9, which is lower than her previous. It has been trickled down from 11 down  to 10.1 and 10.5, and now 9.9. Platelet count is 225. D-dimer is 0.55.   EKG:  Normal sinus rhythm. No ST depression or elevation. Sinus tachycardia, premature supraventricular complexes, incomplete right bundle branch with T-wave inversion which apparently is chronic.   ASSESSMENT AND PLAN:  This is an 81 year old female with a history of multiple medical problems, admitted for evaluation of syncope. 1.  Syncopal episode. The patient had signs of dehydration and intravascular volume depletion. Her blood pressure was low this morning and she took all of her medications. At this moment her blood pressure is 129/70. We are going to do orthostatic vital signs. We are going to put her on IV fluids, 75 mL an hour for 1 L and then re-evaluate the need of more or if we can stop at  that moment. The patient has slight elevation of her creatinine from baseline which accounts for dehydration. The patient also has had problems in the past with medications like a beta blocker which needed to be stop due to bradycardia and hypotension. The patient exhibited symptoms of orthostatic hypotension, which she gets dizzy spells whenever she moves around or changes positions from supine or sitting to standing.  2.  To rule out any other etiologies, we are going to do an ultrasound of the carotid arteries. The patient had multiple risk factors for peripheral artery disease and new strokes. At this moment she did not have any signs or symptoms of transient ischemic attack, only double vision.  3.  We are going to hold on getting an MRI, as this does not look significantly like a transient ischemic attack. Offered to the patient to go home today and do this outpatient. The patient wants to make sure that she is okay.  4.  Coronary artery disease. The patient is not on a beta blocker due to intolerance to it with bradycardia and hypotension. The patient is taking exforge. We are stopping this medication for now due to symptomatology. Continue aspirin.  5.  Bilateral breast cancer, the first one occurring on the right, then moved to the left within six years.  6.  Diabetes. Seems to be well controlled. We are going to check on a hemoglobin A1c.  7.  Hypertension. We are going to allow her blood pressure to run a little bit high.  8.  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 This could be accounting for some of her dizzy spells.  9.  Hyperkalemia. The patient is not taking any extra potassium. I think this is secondary to hemolysis of the lab sample. We are going to repeat it in the morning. We are going to give her IV fluids. I do not think there is a need to give her any being thing extra as there are no significant changes in her EKG.  10.  Hypothyroidism. Continue Synthroid.  11.   End-stage renal disease, acute on chronic.   12.  Deep vein thrombosis prophylaxis. Low-dose heparin. 13.  Gastrointestinal prophylaxis:  Protonix.   TIME SPENT:  I spent about 50 minutes with this admission.  ____________________________ Condon Sink, MD rsg:jm D: 01/11/2013 14:22:49 ET T: 01/11/2013 15:00:07 ET JOB#: [id number]  cc: Forest Sink, MD, <Dictator> Leland Raver [place] Brown MD ELECTRONICALLY SIGNED 01/12/2013 13:31

## 2014-10-24 NOTE — H&P (Signed)
PATIENT NAME:  Mariah Watkins, Mariah Watkins MR#:  [id number] DATE OF BIRTH:  [birth year]  DATE OF ADMISSION:  05/02/2014  PRIMARY CARE PHYSICIAN: Nonlocal.  REFERRING PHYSICIAN: Conni Slipper, MD   CHIEF COMPLAINT: Lower abdominal pain, shaking today.   HISTORY OF PRESENT ILLNESS: An 81 year old Caucasian female with a history of hypertension, diabetes, ulcerative colitis presented to the ED with the above chief complaint. The patient is alert, awake, oriented, in no acute distress. The patient said she started to have lower abdominal pain, nausea, vomiting today. In addition, she noticed some feces in her urine. The patient also has chills but denies any fever. The patient has melena but she said that she is taking iron. The patient had a CAT scan of the abdomen and pelvis showed mild sigmoid diverticulitis with a colovesical fistula. ED physician, Dr. Cinda Quest, called surgeon, Dr. Leanora Cover. He suggested the patient does not need surgery at this time; admit the patient under medical service.   PAST MEDICAL HISTORY: Hypertension; hyperlipidemia; diabetes; hypothyroidism; GERD; gout; DVT and PE; ulcerative colitis; breast cancer, both sides; osteoporosis; chronic renal failure status post renal stent due to renal artery stenosis; history of TIA.   PAST SURGICAL HISTORY: Dilation and curettage, bilateral cataract surgery, bilateral mastectomies.   SOCIAL HISTORY: Quit smoking 38 years ago. No alcohol drinking or illicit drugs.   FAMILY HISTORY: Father died of MI.   ALLERGIES: SULFA DRUGS.   HOME MEDICATIONS: Losartan 40 mg p.o. daily, Tylenol 500 mg p.o. once a day and p.r.n., Synthroid 100 mcg p.o. once a day, sodium bicarbonate 650 mg t.i.d., oxybutynin 5 mg p.o. at bedtime, omeprazole 20 mg p.o. daily, nitroglycerin 0.4 mg sublingual every 5 minutes p.r.n. for chest pain, glyburide 2.5 mg p.o. daily, Ferro-Sequels 150/40 mg p.o. tablets once a day, Caltrate 600+D oral tablets 1 tablet b.i.d.,  aspirin 81 mg  p.o. daily, allopurinol 100 mg p.o. daily.  REVIEW OF SYSTEMS:  CONSTITUTIONAL: The patient denies any fever but has chills. No headache or dizziness but has generalized weakness.  EYES: No double vision, blurry vision.  ENT: No postnasal drip, slurred speech, or dysphagia.  CARDIOVASCULAR: No chest pain, palpitation, orthopnea, nocturnal dyspnea. No leg edema.  PULMONARY: No cough, sputum, shortness of breath, or hematemesis.  GASTROINTESTINAL: Positive for abdominal pain, nausea, vomiting, but no diarrhea. Has melena but no bloody stool.  GENITOURINARY: Has dysuria, hematuria, and incontinence.  SKIN: No rash or jaundice.  NEUROLOGY: No syncope, loss of consciousness, or seizure.  ENDOCRINOLOGY: No polyuria, polydipsia, heat or cold intolerance.  HEMATOLOGY: No easy bruising, bleeding.   PHYSICAL EXAMINATION:  VITAL SIGNS: Temperature 98, blood pressure 132/80, pulse 111, oxygen saturation 98% on room air.  GENERAL: The patient is alert, awake, oriented, in no acute distress.  HEENT: Pupils round, equal and reactive to light and accommodation. Dry oral mucosa. Clear oropharynx.  NECK: Supple. No JVD or carotid bruit. No lymphadenopathy. No thyromegaly.  CARDIOVASCULAR: S1, S2 regular rate and rhythm. No murmurs, gallops.  PULMONARY: Bilateral air entry. No wheezing or rales. No use of accessory muscles to breathe.  ABDOMEN: Soft. No distention but has mild tenderness in left lower quadrant. No rigidity, no rebound. Bowel sounds present. No obvious organomegaly.  EXTREMITIES: No edema, clubbing, or cyanosis. No calf tenderness. Bilateral pedal pulses present.  SKIN: No rash, jaundice.   NEUROLOGY: A and O x 3. No focal deficit. Power 5/5. Sensation intact.   LABORATORY DATA: CAT scan of abdomen and pelvis showed suspect mild sigmoid diverticulitis with colovesical  fistula, nonobstructing right nephrolithiasis, hiatal hernia. Urinalysis showed WBC 639, RBC 7, hemoglobin 10.2, WBC 8.0,  platelets 242,000. Glucose 139, BUN 48, creatinine 2.14. Electrolytes normal.   IMPRESSION:  1.  Sigmoid diverticulitis with colovesical fistula.  2.  Urinary tract infection.  3.  Anemia.  4.  Dehydration.  5.  Hypertension.  6.  Diabetes.  7.  Chronic kidney disease.   PLAN OF TREATMENT:  1.  The patient will be admitted to medical floor. We will follow up a general surgery consult. We will start Cipro and Flagyl, follow up CBC and urine culture.  2.  For dehydration we will give IV fluid support, follow up BMP.  3.  For diabetes, we will start sliding scale but hold glyburide.  4.  Hold aspirin at this time for possible surgery.  I discussed the patient's condition and plan of treatment with the patient, the patient's 2 daughters.  CODE STATUS: The patient wants full code.   TIME SPENT: About 62 minutes.    ____________________________ Demetrios Loll, MD qc:ST D: 05/02/2014 20:17:40 ET T: 05/02/2014 21:36:26 ET JOB#: [id number]  cc: Demetrios Loll, MD, <Dictator> Demetrios Loll MD ELECTRONICALLY SIGNED 05/05/2014 15:23

## 2014-10-24 NOTE — Consult Note (Signed)
Brief Consult Note: Diagnosis: colovesicular fistula. ddx diverticular dz vs CA.   Patient was seen by consultant.   Consult note dictated.   Recommend further assessment or treatment.   Comments: Would proceed with colonoscopy, cont abx, will likely need segmental resection and closure of bladder fistula.  Electronic Signatures: Sherri Rad (MD)  (Signed [phone] 16:26)  Authored: Brief Consult Note   Last Updated: 01-Nov-15 16:26 by Sherri Rad (MD)

## 2014-10-24 NOTE — Op Note (Signed)
PATIENT NAME:  Mariah Watkins, Mariah Watkins MR#:  [id number] DATE OF BIRTH:  Dec 01, 1922  DATE OF PROCEDURE:  05/06/2014  PREOPERATIVE DIAGNOSIS: Colovesical fistula.   POSTOPERATIVE DIAGNOSIS: Colovesical fistula.   OPERATION: 1.  Sigmoid resection.  2.  Closure of bladder fistula.  3.  Diverting ileostomy.   SURGEON: Rodena Goldmann III, MD  ANESTHESIA:  General and epidural.    OPERATIVE PROCEDURE: With the patient in the supine position after the induction of appropriate epidural anesthesia and a general anesthetic with intubation, a Foley catheter was placed. The patient's abdomen was prepped with ChloraPrep and draped with sterile towels. An alcohol wipe and Betadine impregnated Steri-Drape were utilized.   A lower midline incision was made and carried down from just above the umbilicus to the pubic bone.  The incision was carried down to the subcutaneous tissue with Bovie electrocautery. Midline fascia identified and opened the length of the skin incision as was the peritoneum. There was no significant free fluid or pus.  Colon appeared to be adherent to the left side of the bladder. It was a dense adherence over the mass in the sigmoid and in the dome of the bladder, both of which appears to be inflammatory. Tedious dissection was required with finger fracture and sharp dissection to remove the bowel from the bladder. The defect was noted in both. The defect was temporarily closed in the bladder with 2-0 Vicryl. I plan to perform a sigmoid resection. The area of bowel involved was fairly minimal, so I performed a limited resection, dividing the bowel with the GIA stapler 75 stapling device and the mesentery with the LigaSure apparatus.  The prep had not been particularly successful as there was obviously palpable stool in the distal colon. I elected to perform a handsewn anastomosis with a posterior row of 3-0 silk sutures opening the bowel, performed with running mucosal suture of 3-0 Vicryl tying in the  middle, and then an anterior row of 3-0 silk sutures of seromuscular suture. Appendices epiploicae was then sutured over the anastomosis.  The mesenteric defect was not closed.  The area was copiously irrigated. There did not appear to be any leak when forcing air through the anastomosis. The bladder defect was then closed in 3 layers using a running suture of 2 layers of 3-0 Vicryl and then a serosal suture of 2-0 Vicryl. Because of the potential contamination with the poor prep, I elected to perform a proximal diverting ileostomy. A loop of ileum was identified, stitched for identification. An ostomy site was chosen on the anterior abdominal wall. An ostomy site then created. A cruciate incision made in the fascia and bluntly dissected into the abdomen. The bowel was brought up through the abdominal wall and a bridge placed through the mesentery. The abdomen was then irrigated. Bowel contents returned to anatomic position.  The anastomosis and the bladder closure were covered with fibrin gel. The omentum was placed between the 2 structures.  The fascia was closed with a running suture of looped #1 PDS tying in the middle burying the knot. A Penrose drain was placed in the depths of the incision and the wound clipped. The ostomy was matured and ostomy bag placed. The patient was returned to the recovery room having tolerated the procedure well. Sponge, instrument, and needle counts were correct x 2 in the operating room.   ____________________________ Rodena Goldmann III, MD rle:LT D: 05/06/2014 17:32:23 ET T: 05/06/2014 18:46:44 ET JOB#: [id number]  cc: Rodena Goldmann III, MD, <  Dictator> Rodena Goldmann MD ELECTRONICALLY SIGNED 05/13/2014 13:48

## 2014-10-24 NOTE — Consult Note (Signed)
PATIENT NAME:  Mariah Watkins, Mariah Watkins MR#:  [id number] DATE OF BIRTH:  1923-05-29  DATE OF CONSULTATION:  05/04/2014  REFERRING PHYSICIAN:   CONSULTING PHYSICIAN:  Janice Coffin. Elnoria Howard, DO  HISTORY OF PRESENT ILLNESS: We are asked to do a urology consult in regards to a vesicovaginal fistula. The patient has had 3 to 4 weeks of pneumaturia and now has fecal matter coming out through her bladder. On CAT scan she has an obvious colovesical fistula. She has no need for urology intervention unless general surgery decides they need our help at the time of their closure of the diverticular and colon fistula. We will happily assist in this to close the bladder if they need our help. Postoperatively, after the bladder closes, she should have a week of a Foley catheter. The patient has other medical problems including known renal disease with renal artery stenosis and stent in place. She lives at home, does her activities of daily living. She did not tell anyone about this pneumaturia or this fecal matter coming out of her bladder. She presented with lower abdominal pain, nausea and vomiting. She had chills but no fever.   PAST MEDICAL HISTORY: Hypertension, hyperlipidemia, diabetes, hypothyroidism, GERD, gout, DVT, PE, ulcerative colitis, breast cancer, osteoporosis, status post renal stent, history of TIA.   PAST SURGICAL HISTORY: D and C, bilateral cataracts, bilateral mastectomy.  SOCIAL HISTORY: Smoked until 38 years ago.   FAMILY HISTORY: Father died of a MI.   MEDICATIONS: Losartan 40 mg daily, Tylenol 500 mg once a day, Synthroid 100 mg, sodium bicarb 5 mg, omeprazole p.o. daily, nitroglycerin 0.4 sublingual for chest pain, glyburide, Ferro-Sequels, aspirin, allopurinol.  REVIEW OF SYSTEMS:  CONSTITUTIONAL: At the present time, she denies pain, fever or chills. She is stronger. She does not have the generalized weakness she had when she came in.  EYES: No blurring of vision.  ENT: No postnasal drip, slurred  speech or dysphagia.  CARDIOVASCULAR: No chest pain, palpitations. Has not used nitroglycerin for a while.  PULMONARY: No cough, sputum, shortness of breath.  GASTROINTESTINAL: No longer abdominal pain, but had nausea, vomiting, and abdominal pain. GENITOURINARY: She has had burning, does not have it now. She had hematuria. She had has pneumaturia and fecal matter and vegetative matter coming out through her urine. Does not have a Foley and should not have one.  SKIN: No rash or jaundice.   PHYSICAL EXAMINATION: GENERAL: Well-developed, well-nourished. HEENT: Pupils are equal, round and reactive to light. Hearing is decreased.  NECK: Supple. No lymphadenopathy. No thyromegaly or thyroid enlargement.  HEART: Sounds regular in rate and rhythm.  LUNGS: Clear to auscultation at this time. ABDOMEN: Soft. No distention. Minimal tenderness in the left lower quadrant. No rigidity, rebound.  EXTREMITIES: No edema, jaundice, etc.   DIAGNOSTIC DATA: Laboratory results: Creatinine 1.82 and BUN 26, which I do not find to be too much of a problem. So on admission her white count was 8000; it has gone down to 6000. Her hemoglobin is 10.4 and hematocrit 31. With hydration hemoglobin is 9.8 and hematocrit 30.   Urine has negative nitrites, but positive WBCs. She had 100,000 gram-negative rods with mixed flora, which one would expect. Culture and sensitivity: ID sensitivities have not come back yet.   IMPRESSION: At this point, we will follow her with general surgery and see her in follow-up. I talked at length and had a 30 minute conversation with family.   ____________________________ Janice Coffin. Elnoria Howard, DO rdh:sb D: 05/04/2014 16:08:31 ET T:  05/04/2014 16:34:49 ET JOB#: [id number]  cc: Janice Coffin. Elnoria Howard, DO, <Dictator> RICHARD D HART DO ELECTRONICALLY SIGNED 05/18/2014 16:35

## 2014-10-24 NOTE — Discharge Summary (Signed)
PATIENT NAME:  Mariah Watkins, Mariah Watkins MR#:  [id number] DATE OF BIRTH:  Nov 16, 1922  DATE OF ADMISSION:  05/02/2014 DATE OF DISCHARGE:  05/13/2014  ADMITTING DIAGNOSES: 1. Sigmoid diverticulitis with colovesical fistula.  2. Urinary tract infection.  3. Anemia.  4. Dehydration.   SECONDARY ADMITTING DIAGNOSES:  1. Hypertension.  2. Chronic kidney disease.   DISCHARGE DIAGNOSES: 1. Diverticulitis of large intestine with perforation with colovesicular fistula status post surgery.  2. Acute cystitis, Escherichia coli with mixed bacterial organism.  3. Chronic kidney disease, stage 4.   SECONDARY DISCHARGE DIAGNOSES:  1. Hypertension.  2. Diabetes mellitus type 2 with nephropathy.  3. Coronary artery disease.  4. History of breast cancer under remission.   OUTPATIENT FOLLOW UP: With oncology as recommended.   CONSULTATIONs: Surgery, Dr. Pat Patrick.  PROCEDURES: Closure of the colovesicular fistula, segmental colon resection and diverticulectomy performed on 05/06/2014 by Dr. Pat Patrick.   HOSPITAL COURSE: The patient is an 81 year old, Caucasian female who came into the ED with a chief complaint of lower abdominal pain and chills. Please review Dr. Lianne Moris H and P for details. The patient was admitted to the hospital. Please review the interim discharge summary dictated by Dr. Ether Griffins on 05/08/2014 as well as the addendum to the interim discharge summary dictated by Dr. Ether Griffins. The patient was placed on IV fluids, IV antibiotics and she had surgery done on 05/06/2014. The colovesical fistula was closed and segmental colon was resected. Diverting ileostomy was placed. Postoperatively, the patient was placed on a clear liquid diet and followed up by surgery on a daily basis.   The patient had a prolonged hospital course. IV antibiotics were continued. Pain management was provided. Incentive spirometry was given and encouraged to use to prevent atelectasis of the lungs. Foley catheter was continued for 7 days  postoperatively, as recommended by surgery. Slowly, the diet was advanced. The patient tolerated the diet well. Diverting ileostomy was functioning. Physical therapy was consulted. PT has evaluated the patient and they have recommended a skilled nursing facility. The patient has good family support and the family refused to send her to a skilled nursing facility. They have requested for home with home health. Case management was consulted regarding admission to home health. The patient started feeling better and tolerating diet. It was recommended to discharge the patient to home with home health.   CONDITION AT THE TIME OF DISCHARGE: Stable.   DISCHARGE MEDICATIONS: Aspirin 81 mg p.o. once daily, Caltrate with vitamin D 1 tablet p.o. 2 times a day, iron sulfate 325 mg 1 tablet p.o. once daily, glyburide 2.5 mg 1 tablet p.o. once daily, nitroglycerin 0.4 mg sublingually every 5 minutes up to 3 doses as needed for chest pain, Synthroid 100 mcg p.o. once daily, allopurinol 100 mg p.o. once daily, Tylenol 500 mg 1 tablet p.o. once daily as needed, oxybutynin 5 mg 1 tablet p.o. at bedtime, sodium bicarbonate 650 mg 1 tablet p.o. 3 times a day, omeprazole 20 mg 1 capsule p.o. once daily, nortriptyline 10 mg orally once a day for leg pain, Seroquel 25 mg p.o. at bedtime daily, Coreg 12.5 mg p.o. b.i.d.. Valsartan was discontinued.  DIET AT THE TIME OF DISCHARGE: Low-sodium, carb controlled, diabetic diet.   Home with PT. Follow up with primary care physician in a week and Dr. Pat Patrick on 11/24 as recommended by Dr. Pat Patrick. Continue wound care as recommended by Dr. Pat Patrick.  VITAL SIGNS: At the time of discharge: Temperature 98.1, pulse 73, respirations 18 to 19, blood  pressure 98/68, pulse oximetry 94% at rest.   LABORATORY DATA: On 11/08: Hemoglobin 0.6, hematocrit 29.7, platelets are 236,000, WBC 10.4. BMP on 11/09: Glucose 126, BUN 26, creatinine 1.78, sodium, potassium and chloride are normal. Anion gap is 7, GFR 29.  Serum osmolality and calcium are normal.   DIAGNOSTIC DATA: CAT scan of the head without contrast: No acute intracranial findings, atrophic white matter and microvascular disease are unchanged.   The diagnosis and plan of care was discussed in detail with the patient and her family members at bedside. They all verbalized understanding of the plan.   TOTAL TIME SPENT ON THE DISCHARGE: 45 minutes.     ____________________________ Nicholes Mango, MD ag:TT D: 05/17/2014 17:59:22 ET T: 05/17/2014 18:54:26 ET JOB#: [id number]  cc: Nicholes Mango, MD, <Dictator> Micheline Maze, MD Primary Care Physician  Nicholes Mango MD ELECTRONICALLY SIGNED 05/20/2014 22:10

## 2014-10-24 NOTE — Consult Note (Signed)
PATIENT NAME:  Mariah Watkins, Mariah Watkins MR#:  [id number] DATE OF BIRTH:  [birth year]  DATE OF CONSULTATION:  05/03/2014  REFERRING PHYSICIAN:   CONSULTING PHYSICIAN:  Pauline Trainer A. Marina Gravel, MD  REASON FOR CONSULTATION: Colovesicular fistula.   HISTORY: This is an 81 year old active white female with a history of bilateral breast cancer, hypertension, and either collagenous colitis or ulcerative colitis or nonspecific colitis, presented to the Emergency Room yesterday with chills, but no fever and noting particulate matter and air passed through her urine. The patient was worked up in the Emergency Room with a CT scan concerning for colovesicular fistula. The patient was admitted to the medical service and as such, surgical services have been contacted. The patient has not had a colonoscopy in greater than five years.   ALLERGIES: SULFA.   MEDICATIONS: Losartan, Tylenol, Synthroid, sodium bicarbonate, oxybutynin, omeprazole, nitroglycerin, Glyburide, iron, Caltrate, aspirin and allopurinol.   FAMILY HISTORY: Significant for myocardial infarction.   SOCIAL HISTORY: Does not smoke or drink. Quit many years ago.   PAST SURGICAL HISTORY:  1. Bilateral modified radical mastectomies by Dr. Pat Patrick in the years 2003 and 2009.  2. Dilatation and curettage for endometrial polyp.  3. Bilateral cataract surgery.   PAST MEDICAL HISTORY: Significant for:  1. Hypertension.  2. Hyperlipidemia.  3. Diabetes.  4. Hypothyroidism.  5. History of deep vein thrombosis and PE.  6. History of colitis.  7. History of breast cancer.  8. History of osteoporosis.  9. History of chronic renal insufficiency and renal artery stenosis status post renal artery PTCA.   10. History of TIA.   REVIEW OF SYSTEMS: As described above and as per ten-point review, otherwise unremarkable.   PHYSICAL EXAMINATION: GENERAL: The patient is resting quietly in no obvious distress. Alert and oriented x3.  VITAL SIGNS: Temperature is 98.3, pulse 108,  respiratory rate 20, room air saturation 97%. Blood pressure is 145/69.  LUNGS: Clear.  HEART: Regular rate and rhythm.  ABDOMEN: Soft, nontender. No masses. No hernias. No scars. No tenderness. No rebound. No peritoneal signs.  EXTREMITIES: Warm and well perfused.  CHEST: Demonstrates bilateral mastectomies.  NEUROLOGIC: Alert and oriented x4.  PSYCHIATRIC: Appropriate mood, judgment and affect. Cranial nerves unremarkable. Facies are symmetrical NECK: Supple without obvious adenopathy.   LABORATORY VALUES: Urinalysis: 639 white cells per high-power field, 3+ bacteria, 3+ leukocyte esterase, 1+ blood. Creatinine is 1.97, sodium is 139, potassium 4.2, CO2 of 26, glucose 203. White count this morning was 6.4, hemoglobin 9.8, hematocrit 30, platelet count 207,000 with normal differential. Review of CT scan demonstrates bilateral small renal cysts bilaterally. Diverticulosis is seen in the sigmoid colon. Extraluminal gas collection with mild wall thickening, indenting the left superior bladder wall with adjacent bladder wall thickening is seen consistent with colovesicular fistula. Small hiatal hernia.   IMPRESSION:  1. Sigmoid diverticulitis with colovesicular fistula.  2. History of breast cancer status post surgery and adjuvant chemotherapy.  3. Hypertension.  4. Chronic renal insufficiency.   RECOMMENDATIONS: At present, I would treat her with intravenous antibiotics and bowel rest. She will need an interval colonoscopy at some point in the near future and likely surgical intervention. There is no acute indication for surgical intervention at this time.   TOTAL TIME SPENT: 45 minutes.    ____________________________ Jeannette How Marina Gravel, MD mab:lm D: 05/03/2014 17:01:33 ET T: 05/03/2014 22:18:39 ET JOB#: [id number]  cc: Elta Guadeloupe A. Marina Gravel, MD, <Dictator> Hortencia Conradi MD ELECTRONICALLY SIGNED 05/06/2014 7:42

## 2014-10-26 LAB — SURGICAL PATHOLOGY

## 2014-10-29 DIAGNOSIS — R51 Headache: Secondary | ICD-10-CM | POA: Diagnosis not present

## 2014-10-29 DIAGNOSIS — R252 Cramp and spasm: Secondary | ICD-10-CM | POA: Diagnosis not present

## 2014-10-29 DIAGNOSIS — R42 Dizziness and giddiness: Secondary | ICD-10-CM | POA: Diagnosis not present

## 2014-11-01 NOTE — Consult Note (Signed)
PATIENT NAME:  Mariah Watkins, Mariah Watkins MR#:  [id number] DATE OF BIRTH:  May 17, 1923  DATE OF CONSULTATION:  09/01/2014  CONSULTING PHYSICIAN:  Gonzella Lex, MD  IDENTIFYING INFORMATION AND REASON FOR CONSULT: This is an 81 year old woman with no known past psychiatric history who presented to the hospital with a few days of worsening mental state and then being found slumped over at home.   CHIEF COMPLAINT: "I fell."   HISTORY OF PRESENT ILLNESS: History was obtained from the patient, but more so from her two daughters who are present in the room. They started the history back last November when the patient had to have an ileostomy placed. They said that after the ileostomy she developed what sounds like a delirium a few days afterwards that lasted for maybe a couple of weeks or more before it really resolved. Later on, she had the ileostomy taken down and had only mild confusion afterwards, not nearly as bad. She was in her normal state of health until this past Saturday when one of her daughters noticed that the patient seemed more confused and tired and weak than usual. The following day, the other daughter went to visit the patient and found her slumped over in a chair, looking like she might have had a fall. They report that yesterday the patient was still a very confused, but today she has gotten much better. She is still not back to her baseline. The daughters are not very clear about what the true baseline of their mother has been. It sounds like family keep a pretty close eye on her and she has been able to function reasonably well at home. Normally, it sounds like she does not have confusion remembering names or seem obviously demented. There are no complaints of any baseline psychosis or depression symptoms.   PAST PSYCHIATRIC HISTORY: Family and patient deny any past psychiatric history at all. No history of depression. No history of psychiatric hospitalization. No history of suicide attempts.    SOCIAL HISTORY: The patient is a widow; her husband died over 65 years ago. She lives in an apartment for senior citizens. It sounds like she actually has several daughters at least two of whom visit her very frequently. It sounds like generally she has very good family support. 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 She was always reliable with her medication.   SUBSTANCE ABUSE HISTORY: No history of any substance abuse problems at all.   FAMILY HISTORY: No known family history of any mental illness.   MEDICATIONS: On admission it sounds like it was Synthroid 125 mcg a day; sodium bicarbonate 650 mg 1 tablet 3 times a day; omeprazole 20 mg a day, nortriptyline, unclear dose here, at bedtime; nitroglycerin p.r.n.; isosorbide 30 mg daily; glimepiride 2 mg daily; allopurinol 100 mg daily; iron and calcium supplements.   ALLERGIES: SULFA DRUGS.   REVIEW OF SYSTEMS: The patient is not aware of having any problems with her memory. Denies depression. Denies any hallucinations. Not having any complaints of other acute pain. Says she is feeling stronger every day.   MENTAL STATUS EXAMINATION: Elderly woman interviewed in her hospital bed. She was pleasant and cooperative. She is hard of hearing and I had to speak loudly, but when I did so, we had no trouble communicating. The patient was oriented to the hospital and even gave me the correct name of the hospital and was able to tell  me that she was here because of a fall. After that, however, she had some confusion. She told me an incorrect town for where she was, was not able to tell me the correct town she lived in, she named what sounds like an older dwelling as her current place of residence. She could not correctly give me the year or month. She was able to identify all the family members in the room, but it took her a couple of seconds to get the names right. The patient denies  feeling depressed. Says her mood is fine. Affect is euthymic. A little bit scattered with her thoughts, but no bizarre thoughts. Denies hallucinations. Totally denies suicidal or homicidal ideation. The patient could repeat 3 words immediately, but had not the slightest idea what I was talking about when I asked her to repeat them 3 minutes later. She is probably showing adequate judgment and insight given her acute condition, even though she is a little confused about the facts and has some memory impairment. Clearly has some degree of dementia. I did not do a more complete Mini-Mental status exam.   LABORATORY RESULTS: Blood glucoses look like they are running pretty normal right now. On admission, she had low hemoglobin and hematocrit, normal white count. Creatinine is elevated at 1.7, sounds like that is chronic. BUN elevated as well. Low ALT. Normal protein. Magnesium low at 1.7, phosphorus low at 2.1. Urinalysis did not appear to be showing any infection. Blood cultures have not grown anything. Head CT showed global atrophy with no acute abnormalities, some evidence of chronic small vessel changes. The MRI essentially showed the same thing. Ammonia was negative.   VITAL SIGNS: Blood pressure 148/80, respirations 18, pulse 108, temperature 97.3.   ASSESSMENT: An 81 year old woman who had an acute episode of passing out with a day of delirium. Still unclear what brought this on. So far, no really clear diagnosis. MRI scan does not show any obvious sign of a stroke. No obvious foci for an infection. Possible that she could have been getting dehydrated. At this point, she seems to be getting better and returning closer to her baseline. As far as the question of dementia, I am sure she has some of it; the question really is whether she is able to function in her home environment with the amount of dementia that she has.   TREATMENT PLAN: I am not going to do anything as far as any treatment for her. Once  she returns to her baseline, she should probably be reassessed as to whether she ought to be on medicines for slowing down the progression of dementia. I would prefer not to start anything like that which could possibly confuse the picture now. I explained the whole clinical situation to the family, including some reassurance that unless there were a new illness, she should be gradually getting better, but with also making it clear to them that her dementia was probably going to gradually over time be worsening. They seemed to understand this all fine. No further treatment for me necessary at this point. We will follow up as needed.   DIAGNOSIS, PRINCIPAL AND PRIMARY:  AXIS I: Delirium from unknown medical cause.   SECONDARY DIAGNOSES:  AXIS I: No further.  AXIS II: Dementia, vascular and Alzheimer type.  AXIS III: Multiple chronic medical problems including renal insufficiency.    ____________________________ Gonzella Lex, MD jtc:bm D: 09/01/2014 20:35:01 ET T: 09/01/2014 20:52:32 ET JOB#: [id number]  cc: Gonzella Lex, MD, <  Dictator> Gonzella Lex MD ELECTRONICALLY SIGNED 09/08/2014 10:41

## 2014-11-01 NOTE — Consult Note (Signed)
Psychiatry: Patient seen and chart reviewed. She is feeling much better this evening. She is awake and alert and knows where she is and knows the date. Affect smiling. No new complaints.change tocurrent treamtnet. Will follow as needed  Electronic Signatures: Clapacs, Madie Reno (MD)  (Signed on 03-Mar-16 23:26)  Authored  Last Updated: 03-Mar-16 23:26 by Gonzella Lex (MD)

## 2014-11-01 NOTE — H&P (Signed)
PATIENT NAME:  Mariah Watkins, RISING MR#:  [id number] DATE OF BIRTH:  [birth year]  DATE OF ADMISSION:  08/30/2014  PRIMARY CARE PROVIDER: Nonlocal.   EMERGENCY DEPARTMENT REFERRING PHYSICIAN: Baird Cancer. Jacqualine Code, MD   CHIEF COMPLAINT: Altered mental status, agitated.   HISTORY OF PRESENT ILLNESS: The patient is an 81 year old female with a history of hypertension, diabetes, ulcerative colitis, who recently in January had diverting ileostomy reversed due to history of colovesicular fistula repair in the past, who has had some confusion according to the family for the past few months, but it got worse after the surgery. The patient apparently lives by herself and her daughters actually spoke to her earlier today, but when her other daughter went to visit her today she was kind of slumped over to the side when she saw her mom inside. She noticed that that she had coffee spilled on herself and there were things on the floor. There is a concern that she might have fallen. When EMS brought the patient here she was very agitated and combative and refusing to open her eyes. The patient also was noted to have a temperature of 100.9 here in the ED and a heart rate of 115. The patient otherwise is unable to give me any history. Her urinalysis is negative and her chest x-ray does not show infection and her WBC count is normal.   PAST MEDICAL HISTORY: Significant for hypertension, hyperlipidemia, diabetes, hypothyroidism, GERD, GOUT, DVT and PE, history of ulcerative colitis, history of bilateral breast cancer, osteoporosis, chronic renal failure, status post renal stent placement due to renal artery stenosis, history of TIA.   PAST SURGICAL HISTORY:  1.  Dilation and curettage.  2.  Bilateral cataract surgery.  3.  Bilateral mastectomies.   ALLERGIES: SULFA.   MEDICATIONS: Medications which were noted during her January admission are: Tylenol 500 mg 1 tablet p.o. daily as needed, Synthroid 125 mcg daily, sodium  bicarbonate 650 mg 1 tablet p.o. t.i.d., omeprazole 20 mg daily, nortriptyline at bedtime, nitroglycerin 0.4 mg sublingual p.r.n., isosorbide mononitrate 30 mg daily, glimepiride 2 mg daily, iron (Ferro-Sequels) 150 mg daily, Caltrate plus vitamin D 1 tablet p.o. b.i.d., aspirin 81 mg 1 tablet p.o. daily, allopurinol 100 mg daily.   SOCIAL HISTORY: Quit smoking 38 years ago. No alcohol or drug use. Lives alone.   FAMILY HISTORY: Father died of a MI.   REVIEW OF SYSTEMS: Unobtainable due to patient being confused and agitated.   PHYSICAL EXAMINATION:  VITAL SIGNS: Temperature 100.9, pulse 115, respirations 20, blood pressure 178/90, O2 is 100% on room air.  GENERAL: The patient is an elderly female, appears dehydrated. Refusing to open her eyes.  HEENT: Unable to evaluate the eyes because the patient is keeping her eyes closed and resisting me opening her eyes. Nasal exam shows no drainage or ulceration. Oropharynx appears very dry. Not opening her mouth to check for exudate. Ear exam shows no erythema or drainage.   NECK: Supple without any thyromegaly.  CARDIOVASCULAR: Regular rate and rhythm, tachycardic. No murmurs, rubs, clicks, or gallops.  LUNGS: Clear to auscultation bilaterally without any rales, rhonchi, wheezing.  ABDOMEN: Chronically distended according to the family with asymmetry which is normal for the patient. Positive bowel sounds x 4. No guarding. No rebound.  EXTREMITIES: No clubbing, cyanosis, or edema.  SKIN: No rash.  LYMPH NODES: Nonpalpable.  MUSCULOSKELETAL: There is no erythema or swelling.  VASCULAR: Good DP, PT pulses.  PSYCHIATRIC: The patient is currently agitated.  NEUROLOGIC: Unable to  do a neurological exam, but was spontaneously moving all extremities.   LABORATORY DATA: Evaluation in the ED: Glucose 100, BUN 25, creatinine 1.74, sodium 137, potassium 3.7, chloride 100, CO2 of 28, magnesium 1.7, phosphorus 2.1. LFTs were normal. CPK 44. Troponin less than  0.02. TSH 0.679. WBC 6.6, hemoglobin 10.4, platelet count 236,000. INR 1. Urinalysis: Nitrites negative, leukocytes negative.   CT scan shows atrophy, chronic small vessel disease.   Chest x-ray shows no acute cardiopulmonary processes with mild cardiomegaly.   ASSESSMENT AND PLAN: The patient is an 81 year old white female with recent reversal of diverting ileostomy, had confusion prior to surgery but worse since surgery, noticed by her daughter today to be slumped over.  1.  Acute encephalopathy due to potential infection with fever. We will do a panculture. Place her on empiric intravenous Levaquin. We will give her intravenous fluids. She appears dehydrated. Also due to progressive confusion, I will ask neurology to see the patient.  2.  Progressive confusion with possible underlying vascular dementia. Neurology evaluation and further recommendations.  3.  Dehydration. We will give her intravenous fluids.  4.  Hypothyroidism. Continue levothyroxine. Check a TSH.  5.  CODE STATUS: Discussed with the daughter. They wish her to be a FULL CODE. I will ask palliative care to see the patient with poor prognosis with progression of likely her dementia.    TIME SPENT: 60 minutes.   ____________________________ Lafonda Mosses Posey Pronto, MD shp:ts D: 08/30/2014 18:30:50 ET T: 08/30/2014 19:08:11 ET JOB#: [id number]  cc: Bennette Hasty H. Posey Pronto, MD, <Dictator> Alric Seton MD ELECTRONICALLY SIGNED 09/04/2014 15:24

## 2014-11-01 NOTE — Consult Note (Signed)
   Comments   Met with pt's daughter Hoyle Sauer.  Daughter states that family is aware pt cannot return home and are open to placement. Tried to speak about code status and daughter would want siblings involved in this conversation.  Daughter states she could arrange for a family meeting tomorrow and will await Palliative Medicine to follow up tomorrow so we can suggest a time.    Comments   1. goals of care meeting with familyneurology consult pending, will follow  Electronic Signatures: Maudry Mayhew (NP)  (Signed 29-Feb-16 14:22)  Authored: Palliative Care   Last Updated: 29-Feb-16 14:22 by Maudry Mayhew (NP)

## 2014-11-01 NOTE — Discharge Summary (Signed)
PATIENT NAME:  Mariah Watkins, Mariah Watkins MR#:  [id number] DATE OF BIRTH:  1923/03/05  DATE OF ADMISSION:  08/30/2014 DATE OF DISCHARGE:  09/04/2014  ADMITTING DIAGNOSIS: Altered mental status, agitation.   DISCHARGE DIAGNOSES: 1.  Altered mental status, possibly initially related to a viral illness, now resolved.  2.  Likely underlying dementia with behavioral disturbances.  3.  Dehydration, resolved with IV fluids.  4.  Hypothyroidism.  5.  Diabetes with hypoglycemia, not on any treatment.  6.  Generalized deconditioning and weakness.  7.  Hypothyroidism.   8.  Gastroesophageal reflux disease.  9.  Gout.  10.   History of deep vein thrombosis and pulmonary embolus.  11.   History of ulcerative colitis.  12.   History of bilateral breast cancer.  13.   Osteoporosis.  14.   Chronic renal failure.  15.   Status post renal stent placement.  16.   History of transient ischemic attack.  17.   Dilation and curettage.  18.   Status post bilateral cataract surgery.  19.   Bilateral mastectomies.   CONSULTANTS: Neurology.   PERTINENT LABS AND EVALUATIONS: Admitting glucose 100, BUN 25, creatinine 1.74, sodium 137, potassium 3.7, chloride 100, CO2 is 28, calcium is 9.8, magnesium 1.7, phosphorus 2.1. LFTs were normal. Troponin less than 0.02. TSH 0.679. CT of the head without contrast showed no acute abnormality. MRI of the brain showed atrophy, mild chronic microvascular ischemia, no acute abnormality.   HOSPITAL COURSE: Please refer to H and P done by the admitting physician. The patient is an 81 year old white female who was brought to the hospital with some fevers and altered mental status. The patient was initially thought to have changes in mental status due to fevers. The patient was treated empirically with antibiotics. She has continued to have some confusion and agitation. She likely has underlying dementia and was seen by neurology. She is very weak and needs further rehabilitation. At this  time she is stable for discharge.   DISCHARGE MEDICATIONS: Aspirin 81 mg 1 tab p.o. daily, iron Ferro-Sequels 150 daily, nitroglycerin 0.4 every 5 minutes as needed, allopurinol 100 daily, Tylenol 500 daily as needed, sodium bicarbonate 650 one tab p.o. t.i.d., omeprazole 20 one tab p.o. daily, nortriptyline 10 at bedtime, Synthroid 125 daily, glimepiride 2 mg daily, Aricept 10 mg daily.   DIET: Low-sodium, low-fat, low-cholesterol, carbohydrate-controlled diet.   ACTIVITY: As tolerated. PT evaluation and treatment.   FOLLOWUP: With the MD at the skilled nursing facility in 1 to 2 weeks, 2 to 4 weeks with [facility] neurology.   TIME SPENT: 35 minutes.    ____________________________ Lafonda Mosses Posey Pronto, MD shp:at D: 09/04/2014 12:36:06 ET T: 09/04/2014 12:48:14 ET JOB#: [id number]  cc: Keller Mikels H. Posey Pronto, MD, <Dictator> Alric Seton MD ELECTRONICALLY SIGNED 09/04/2014 15:26

## 2014-11-01 NOTE — Discharge Summary (Signed)
PATIENT NAME:  Mariah Watkins, Mariah Watkins MR#:  [id number] DATE OF BIRTH:  December 15, 1922  DATE OF ADMISSION:  07/15/2014 DATE OF DISCHARGE:  07/19/2014  BRIEF HISTORY: Ms. Sentoria Brent is an 81 year old woman who 6-7 months ago underwent an elective colovesical fistula repair with colon anastomosis. However, because of the inflammatory change, I elected to perform a diverting ileostomy. She returns for ileostomy takedown. The procedure was accomplished without difficulty. She had no significant intraoperative problems. Some mild confusion postoperatively, but had bowel function within 24 hours. Drain is removed today. Her wounds look good with no sign of any infection.   DISCHARGE MEDICATIONS: Include aspirin 81 mg p.o. daily, Caltrate 600 b.i.d., ferrous sulfate 150 mg once a day, nitroglycerin 0.5 mg p.r.n., allopurinol 100 mg once a day, Tylenol 500 mg once a day, sodium bicarbonate 650 mg t.i.d., omeprazole 20 mg once a day, nortriptyline 10 mg at bedtime p.r.n., isosorbide mononitrate 30 mg once a day, Synthroid 125 mcg once a day, glimepiride 2 mg once a day. She was also discharged home on Vicodin for pain.  FINAL DISCHARGE DIAGNOSIS: Ileostomy takedown.   SURGERY: Ileostomy takedown.   ____________________________ Micheline Maze, MD rle:TT D: 07/19/2014 12:10:01 ET T: 07/19/2014 20:19:06 ET JOB#: [id number]  cc: Rodena Goldmann III, MD, <Dictator> Rodena Goldmann MD ELECTRONICALLY SIGNED 07/27/2014 20:09

## 2014-11-01 NOTE — Consult Note (Signed)
Referring Physician:  Dustin Flock H :   Reason for Consult: Admit Date: 31-Aug-2014  Chief Complaint: confusion   History of Present Illness: History of Present Illness:   81 yo RHD F presents to [facility] secondary to agitation and confusion.  Family member notes that pt has changed since a fall in 2014 when she had progressive memory loss.  She hit her head pretty bad at that time per family.  She seemed to worsen even more over the past month.  Pt was initially independent of all ADLs and lived by herself but over the past year, her daughters have to check on her daily.  Per family, there have been some fluctuations in her mentation where days are better than other.  ROS:  General denies complaints   HEENT no complaints   Lungs no complaints   Cardiac no complaints   GI no complaints   GU no complaints   Musculoskeletal no complaints   Extremities no complaints   Skin no complaints   Neuro no complaints   Psych no complaints   Past Medical/Surgical Hx:  Gout:   HTN:   Cancer, Breast:   Hypothyroidism:   Arrythmias:   Anemia:   Angina:   CAD:   Kidney Failure:   anemia:   Glaucoma:   colitis:   gout:   hypothyroidism:   breast cancer:   htn:   Diabetes Mellitus, Type II (NIDD):   irregular heart beat:   Hypercholesterolemia:   Colectomy:   Colitis:   Mastectomy: Bilat  Stent - Cardiac:   right mastectomy:   left mastectomy:   Past Medical/ Surgical Hx:  Past Medical History reviewed by me as above   Past Surgical History reviewed by me as abovesulfa   Home Medications: Medication Instructions Last Modified Date/Time  aspirin 81 mg oral tablet 1 tab(s) orally once a day 01-Mar-16 21:26  Caltrate 600 + D oral tablet 1 tab(s) orally 2 times a day 01-Mar-16 21:26  Ferro-Sequels 150 mg (50 mg iron)-40 mg oral tablet, extended release 1 tab(s) orally once a day 01-Mar-16 21:26  nitroglycerin 0.4 mg sublingual tablet 1 tab(s) sublingual every 5  minutes up to 3 doses as needed for chest pain.  01-Mar-16 21:26  allopurinol 100 mg oral tablet 1 tab(s) orally once a day 01-Mar-16 21:26  Tylenol 500 mg oral tablet 1 tab(s) orally once a day as needed. 01-Mar-16 21:26  sodium bicarbonate 650 mg oral tablet 1 tab(s) orally 3 times a day 01-Mar-16 21:26  omeprazole 20 mg oral delayed release capsule 1 cap(s) orally once a day 01-Mar-16 21:26  nortriptyline 10 mg oral capsule  orally once a day (at bedtime) for leg pain 01-Mar-16 21:26  Synthroid 125 mcg (0.125 mg) oral tablet 1 tab(s) orally once a day 01-Mar-16 21:26  glimepiride 2 mg oral tablet 1 tab(s) orally once a day 01-Mar-16 21:26   Allergies:  Sulfa: Unknown  Allergies:  Allergies sulfa   Social/Family History: Employment Status: retired  Lives With: alone  Film/video editor: apartment  Social History: no tob, no EtOH, no illicits  Family History: no seizures, no strokesnl wei   Vital Signs: **Vital Signs.:   04-Mar-16 03:49  Vital Signs Type Routine  Temperature Temperature (F) 97.8  Celsius 36.5  Temperature Source oral  Pulse Pulse 93  Respirations Respirations 18  Systolic BP Systolic BP 992  Diastolic BP (mmHg) Diastolic BP (mmHg) 91  Mean BP 116  Pulse Ox % Pulse Ox % 96  Pulse  Ox Activity Level  At rest; After exertion.  Oxygen Delivery Room Air/ 21 %   Physical Exam: General: nl weight, NAD  HEENT: normocephalic, sclera nonicteric, oropharynx clear  Neck: supple, no JVD, no bruits  Chest: CTA B, no wheezing  Cardiac: RRR, no murmurs, no edema, 2+ pulses  Extremities: no C/C/E, FROM   Neurologic Exam: Mental Status: very alert but oriented to person only, good naming and repetition, no dysarthria  Cranial Nerves: PERRLA, EOMI, nl VF, face symmetric, tongue midline, shoulder shrug equal  Motor Exam: 5/5 B normal, tone, no tremor B, normal tone, no tremor  Deep Tendon Reflexes: 1+/4 B, plantars downgoing B, no Hoffman  Sensory Exam: temp and  light touch intact  Coordination: F to N  WNL   Lab Results: Thyroid:  29-Feb-16 09:15   Thyroid Stimulating Hormone 1.02 (0.45-4.50 (IU = International Unit)  ----------------------- Pregnant patients have  different reference  ranges for TSH:  - - - - - - - - - -  Pregnant, first trimetser:  0.36 - 2.50 uIU/mL)  Hepatic:  28-Feb-16 15:06   Bilirubin, Total 0.5  Alkaline Phosphatase 87  SGPT (ALT)  12  SGOT (AST) 19  Total Protein, Serum 7.5  Albumin, Serum 3.4  Routine Micro:  28-Feb-16 15:07   Specimen Source CLEAN CATCH  Culture Comment NO GROWTH IN 36 HOURS  Result(s) reported on 01 Sep 2014 at 09:53AM.    17:53   Micro Text Report INFLUENZA A,B,H1N1 - PCR   INFLUENZA A BY PCR        NEGATIVE   INFLUENZA B BY PCR        NEGATIVE   H1N1 FLU BY PCR           NOT DETECTED   ANTIBIOTIC                       Routine Chem:  28-Feb-16 15:06   Magnesium, Serum  1.7 (1.8-2.4 THERAPEUTIC RANGE: 4-7 mg/dL TOXIC: > 10 mg/dL  -----------------------)  Phosphorus, Serum  2.1 (Result(s) reported on 30 Aug 2014 at 04:08PM.)    17:53   Ammonia, Plasma < 10 (Result(s) reported on 30 Aug 2014 at 07:33PM.)  29-Feb-16 09:15   Glucose, Serum 82  BUN  21  Sodium, Serum 140  Potassium, Serum 3.8  Chloride, Serum  108  CO2, Serum 24  Calcium (Total), Serum 8.8  Anion Gap 8  Osmolality (calc) 281  02-Mar-16 05:30   Creatinine (comp)  1.71  eGFR (African American)  36  eGFR (Non-African American)  30 (eGFR values <23mL/min/1.73 m2 may be an indication of chronic kidney disease (CKD). Calculated eGFR, using the MRDR Study equation, is useful in  patients with stable renal function. The eGFR calculation will not be reliable in acutely ill patients when serum creatinine is changing rapidly. It is not useful in patients on dialysis. The eGFR calculation may not be applicable to patients at the low and high extremes of body sizes, pregnant women, and vegetarians.)  Cardiac:   28-Feb-16 15:06   CK, Total 44 (Result(s) reported on 30 Aug 2014 at 03:51PM.)  Troponin I < 0.02 (0.00-0.05 0.05 ng/mL or less: NEGATIVE  Repeat testing in 3-6 hrs  if clinically indicated. >0.05 ng/mL: POTENTIAL  MYOCARDIAL INJURY. Repeat  testing in 3-6 hrs if  clinically indicated. NOTE: An increase or decrease  of 30% or more on serial  testing suggests a  clinically important change)  Routine UA:  28-Feb-16  15:07   Color (UA) Straw  Clarity (UA) Clear  Glucose (UA) 50 mg/dL  Bilirubin (UA) Negative  Ketones (UA) Negative  Specific Gravity (UA) 1.009  Blood (UA) Negative  pH (UA) 8.0  Protein (UA) 100 mg/dL  Nitrite (UA) Negative  Leukocyte Esterase (UA) Negative (Result(s) reported on 30 Aug 2014 at 03:50PM.)  RBC (UA) NONE SEEN  WBC (UA) <1 /HPF  Bacteria (UA) NONE SEEN  Epithelial Cells (UA) NONE SEEN  Result(s) reported on 30 Aug 2014 at 03:50PM.  Routine Coag:  28-Feb-16 15:06   Prothrombin 13.0 (11.4-15.0 NOTE: New Reference Range  07/31/14)  INR 1.0 (INR reference interval applies to patients on anticoagulant therapy. A single INR therapeutic range for coumarins is not optimal for all indications; however, the suggested range for most indications is 2.0 - 3.0. Exceptions to the INR Reference Range may include: Prosthetic heart valves, acute myocardial infarction, prevention of myocardial infarction, and combinations of aspirin and anticoagulant. The need for a higher or lower target INR must be assessed individually. Reference: The Pharmacology and Management of the Vitamin K  antagonists: the seventh ACCP Conference on Antithrombotic and Thrombolytic Therapy. OZDGU.4403 Sept:126 (3suppl): [id number]. A HCT value >55% may artifactually increase the PT.  In one study,  the increase was an average of 25%. Reference:  "Effect on Routine and Special Coagulation Testing Values of Citrate Anticoagulant Adjustment in Patients with High HCT Values." American  Journal of Clinical Pathology 2006;126:400-405.)  Routine Hem:  29-Feb-16 09:15   WBC (CBC) 7.3  RBC (CBC)  3.67  Hemoglobin (CBC)  10.3  Hematocrit (CBC)  33.0  MCV 90  MCH 28.1  MCHC  31.3  RDW  15.6  Neutrophil % 62.4  Lymphocyte % 24.0  Monocyte % 12.3  Eosinophil % 0.6  Basophil % 0.7  Neutrophil # 4.5  Lymphocyte # 1.8  Monocyte # 0.9  Eosinophil # 0.0  Basophil # 0.1 (Result(s) reported on 31 Aug 2014 at 09:38AM.)  03-Mar-16 06:11   Platelet Count (CBC) 214 (Result(s) reported on 03 Sep 2014 at 06:50AM.)   Radiology Results: MRI:    01-Mar-16 13:34, MRI Brain Without Contrast  MRI Brain Without Contrast   REASON FOR EXAM:    ams/ ? cva  COMMENTS:       PROCEDURE: MR  - MR BRAIN WO CONTRAST  - Sep 01 2014  1:34PM     CLINICAL DATA:  Altered mental status and confusion    EXAM:  MRI HEAD WITHOUT CONTRAST    TECHNIQUE:  Multiplanar, multiecho pulse sequences of the brain and surrounding  structures were obtained without intravenous contrast.    COMPARISON:  CT head 08/30/2014  FINDINGS:  Moderate atrophy.  Negative for hydrocephalus.    Negative for acute infarct. Mild chronic microvascular ischemic  change in the white matter and pons. Negative for cortical infarct    Negative for hemorrhage    Negative for mass or edema.    Vessels at the base of the brain are patent. Paranasal sinuses  clear.     IMPRESSION:  Atrophy and mild chronic microvascular ischemia. No acute  abnormality      Electronically Signed    By: Franchot Gallo M.D.    On: 09/01/2014 13:40         Verified By: Truett Perna, M.D.,  CT:    28-Feb-16 16:55, CT Head Without Contrast  CT Head Without Contrast   REASON FOR EXAM:    acute altered mental status,  fever  COMMENTS:       PROCEDURE: CT  - CT HEAD WITHOUT CONTRAST  - Aug 30 2014  4:55PM     CLINICAL DATA:  Altered mental status, fever    EXAM:  CT HEAD WITHOUT CONTRAST    TECHNIQUE:  Contiguous axial images  were obtained from the base of the skull  through the vertex without intravenous contrast.    COMPARISON:  05/11/2014  FINDINGS:  No evidence of parenchymal hemorrhage or extra-axial fluid  collection. No mass lesion, mass effect, or midlineshift.    No CT evidence of acute infarction.    Subcortical white matter and periventricular small vessel ischemic  changes. Intracranial atherosclerosis.    Global cortical atrophy.  Secondary ventricular prominence.    The visualized paranasal sinuses are essentially clear. The mastoid  air cells are unopacified.    No evidence of calvarial fracture.   IMPRESSION:  No evidence of acute intracranial abnormality.    Atrophy with small vessel ischemic changes and intracranial  atherosclerosis.      Electronically Signed    By: Julian Hy M.D.    On: 08/30/2014 17:21         Verified By: Julian Hy, M.D.,   Radiology Impression: Radiology Impression: MRI of brain personally reviewed by me and shows mild white matter changes and atrophy   Impression/Recommendations: Recommendations:   prior notes reviewed by me reviewed by me   Delerium vs. dementia-  when pt was examined by me, she seems very appropiate for age but probably has some underlying dementia for her age.  It feels to me that family is more concerned than what is really going on.  Very rarely seizures can cause some dementia like processes.  EEG pending check B12/folate, TSH recommend psychiatric opinion as well will follow  Electronic Signatures: Jamison Neighbor (MD)  (Signed 04-Mar-16 04:28)  Authored: REFERRING PHYSICIAN, Consult, History of Present Illness, Review of Systems, PAST MEDICAL/SURGICAL HISTORY, HOME MEDICATIONS, ALLERGIES, Social/Family History, NURSING VITAL SIGNS, Physical Exam-, LAB RESULTS, RADIOLOGY RESULTS, Recommendations   Last Updated: 04-Mar-16 04:28 by Jamison Neighbor (MD)

## 2014-11-01 NOTE — Op Note (Signed)
PATIENT NAME:  CARINE, NORDGREN MR#:  [id number] DATE OF BIRTH:  06-03-1923  DATE OF PROCEDURE:  07/15/2014  PREOPERATIVE DIAGNOSIS:  Status post ileostomy for colovesical fistula.   POSTOPERATIVE DIAGNOSIS: Status post ileostomy for colovesical fistula.   OPERATION: Closure of ileostomy.   ANESTHESIA: General.   SURGEON: Micheline Maze, MD   ASSISTANT:  Lew Dawes. Oaks, MD  OPERATIVE PROCEDURE: With the patient in the supine position after induction of appropriate general anesthesia, the patient's ostomy site was sutured closed with 3-0 nylon. The abdomen was then prepped with ChloraPrep and draped with sterile towels. An alcohol wipe and Betadine-impregnated Steri-Drape were utilized.   Elliptical incision was made around the ostomy site transversely and carried down through the subcutaneous tissue with Bovie electrocautery. The bowel was divided without difficulty, separated from the fascia without difficulty, elevated into the incision. GIA-55 stapling device was used to divide each loop of bowel, passing the ostomy off as a single specimen. The 2 loops of bowel were placed side by side and a 2 layer handsewn anastomosis performed to rejoin the loops of bowel. Posterior layer of 3-0 silk in a seromuscular fashion was utilized. The bowel was opened, removing the staple lines. A mucosal suture of 3-0 Vicryl was used over and over on the posterior layer and a Connell suture on the anterior layer.  Then, a second anterior seromuscular layer of 3-0 silk was utilized. The mesenteric defect was closed with 3-0 Vicryl. Bowel contents returned in their anatomic position. Two layer closure was performed with the fascia after  appropriate irrigation; 0 Vicryl was used in a running fashion. Penrose drain was placed through a separate stab wound and placed in the bed of the repair. Skin was clipped. Sterile dressings were applied. The patient was returned to the recovery room, having tolerated the procedure  well. Sponge, instrument and needle counts were correct x 2 in the operating room.   ____________________________ Rodena Goldmann III, MD rle:LT D: 07/15/2014 15:20:47 ET T: 07/15/2014 16:32:09 ET JOB#: [id number]  cc: Rodena Goldmann III, MD, <Dictator> Rodena Goldmann MD ELECTRONICALLY SIGNED 07/18/2014 18:41

## 2014-11-01 NOTE — Consult Note (Signed)
PATIENT NAME:  Mariah Watkins, INNES MR#:  [id number] DATE OF BIRTH:  1922-07-20  DATE OF CONSULTATION:  09/04/2014  REFERRING PHYSICIAN:   CONSULTING PHYSICIAN:  Leotis Pain, MD  REASON FOR CONSULTATION: Agitation.   HISTORY OF PRESENT ILLNESS: This is a followup evaluation for 81 year old right-handed female presenting to [facility] secondary to agitation and confusion. Information obtained from chart and from the patient's daughter who is at bedside. The patient has been worsening for the past month with lack of sleep, has been agitated and appears confused in the couple of days. Status post imaging. MRI of the brain and EEG are negative. EEG showed slowing. MRI no acute intracranial abnormality   NEUROLOGIC EXAMINATION: The patient is confused, disoriented. Tells me her name. Unable to tell me time, location and the reason why she is in the hospital. Facial sensation intact. Facial motor is intact. Tongue is midline. Uvula elevates symmetrically. Shoulder shrug intact. Motor strength 5/5 bilaterally, upper and lower extremity.   IMPRESSION: An 81 year old female admitted with agitation and confusion. I believe this is likely chronic dementia with acute delirium component. When questioned the patient has not slept in the past couple of days, has poor sleep. I think she has her sleep/wake cycle reversed.  PLAN: I would start Seroquel at night, probably give her 25 mg dose p.o. at night. During the day, have the blinds open, have the lights on as I believe delirium is contributing to her current mental status. Imaging reviewed as above. No further imaging from a neurological standpoint. Please call with questions. ____________________________ Leotis Pain, MD yz:sb D: 09/04/2014 15:23:15 ET T: 09/04/2014 17:00:54 ET JOB#: [id number]  cc: Leotis Pain, MD, <Dictator> Leotis Pain MD ELECTRONICALLY SIGNED 09/17/2014 12:10

## 2014-11-04 DIAGNOSIS — I10 Essential (primary) hypertension: Secondary | ICD-10-CM | POA: Diagnosis not present

## 2014-11-04 DIAGNOSIS — N184 Chronic kidney disease, stage 4 (severe): Secondary | ICD-10-CM | POA: Diagnosis not present

## 2014-11-04 DIAGNOSIS — R809 Proteinuria, unspecified: Secondary | ICD-10-CM | POA: Diagnosis not present

## 2014-11-04 DIAGNOSIS — E872 Acidosis: Secondary | ICD-10-CM | POA: Diagnosis not present

## 2014-11-04 DIAGNOSIS — D631 Anemia in chronic kidney disease: Secondary | ICD-10-CM | POA: Diagnosis not present

## 2014-11-05 DIAGNOSIS — G9340 Encephalopathy, unspecified: Secondary | ICD-10-CM | POA: Diagnosis not present

## 2014-11-05 DIAGNOSIS — F0390 Unspecified dementia without behavioral disturbance: Secondary | ICD-10-CM | POA: Diagnosis not present

## 2014-11-05 DIAGNOSIS — I129 Hypertensive chronic kidney disease with stage 1 through stage 4 chronic kidney disease, or unspecified chronic kidney disease: Secondary | ICD-10-CM | POA: Diagnosis not present

## 2014-11-05 DIAGNOSIS — E119 Type 2 diabetes mellitus without complications: Secondary | ICD-10-CM | POA: Diagnosis not present

## 2014-11-05 DIAGNOSIS — N189 Chronic kidney disease, unspecified: Secondary | ICD-10-CM | POA: Diagnosis not present

## 2014-11-05 DIAGNOSIS — K5190 Ulcerative colitis, unspecified, without complications: Secondary | ICD-10-CM | POA: Diagnosis not present

## 2014-11-12 DIAGNOSIS — I701 Atherosclerosis of renal artery: Secondary | ICD-10-CM | POA: Diagnosis not present

## 2014-11-12 DIAGNOSIS — N184 Chronic kidney disease, stage 4 (severe): Secondary | ICD-10-CM | POA: Diagnosis not present

## 2014-11-12 DIAGNOSIS — E785 Hyperlipidemia, unspecified: Secondary | ICD-10-CM | POA: Diagnosis not present

## 2014-11-12 DIAGNOSIS — E119 Type 2 diabetes mellitus without complications: Secondary | ICD-10-CM | POA: Diagnosis not present

## 2014-11-12 DIAGNOSIS — I10 Essential (primary) hypertension: Secondary | ICD-10-CM | POA: Diagnosis not present

## 2014-11-18 DIAGNOSIS — N189 Chronic kidney disease, unspecified: Secondary | ICD-10-CM | POA: Diagnosis not present

## 2014-11-18 DIAGNOSIS — F0390 Unspecified dementia without behavioral disturbance: Secondary | ICD-10-CM | POA: Diagnosis not present

## 2014-11-18 DIAGNOSIS — K5190 Ulcerative colitis, unspecified, without complications: Secondary | ICD-10-CM | POA: Diagnosis not present

## 2014-11-18 DIAGNOSIS — I129 Hypertensive chronic kidney disease with stage 1 through stage 4 chronic kidney disease, or unspecified chronic kidney disease: Secondary | ICD-10-CM | POA: Diagnosis not present

## 2014-11-18 DIAGNOSIS — E119 Type 2 diabetes mellitus without complications: Secondary | ICD-10-CM | POA: Diagnosis not present

## 2014-11-18 DIAGNOSIS — G9340 Encephalopathy, unspecified: Secondary | ICD-10-CM | POA: Diagnosis not present

## 2014-12-04 DIAGNOSIS — I10 Essential (primary) hypertension: Secondary | ICD-10-CM | POA: Insufficient documentation

## 2014-12-10 DIAGNOSIS — I10 Essential (primary) hypertension: Secondary | ICD-10-CM | POA: Diagnosis not present

## 2014-12-10 DIAGNOSIS — I6523 Occlusion and stenosis of bilateral carotid arteries: Secondary | ICD-10-CM | POA: Diagnosis not present

## 2014-12-10 DIAGNOSIS — R55 Syncope and collapse: Secondary | ICD-10-CM | POA: Diagnosis not present

## 2014-12-10 DIAGNOSIS — I2510 Atherosclerotic heart disease of native coronary artery without angina pectoris: Secondary | ICD-10-CM | POA: Diagnosis not present

## 2014-12-25 ENCOUNTER — Other Ambulatory Visit: Payer: Self-pay | Admitting: *Deleted

## 2014-12-25 DIAGNOSIS — Z853 Personal history of malignant neoplasm of breast: Secondary | ICD-10-CM

## 2014-12-28 ENCOUNTER — Inpatient Hospital Stay: Payer: Medicare Other

## 2014-12-28 ENCOUNTER — Inpatient Hospital Stay: Payer: Medicare Other | Attending: Oncology | Admitting: Oncology

## 2014-12-28 VITALS — BP 167/91 | HR 97 | Temp 97.0°F | Wt 134.9 lb

## 2014-12-28 DIAGNOSIS — Z853 Personal history of malignant neoplasm of breast: Secondary | ICD-10-CM | POA: Diagnosis not present

## 2014-12-28 DIAGNOSIS — D649 Anemia, unspecified: Secondary | ICD-10-CM

## 2014-12-28 DIAGNOSIS — D631 Anemia in chronic kidney disease: Secondary | ICD-10-CM | POA: Insufficient documentation

## 2014-12-28 DIAGNOSIS — Z170 Estrogen receptor positive status [ER+]: Secondary | ICD-10-CM

## 2014-12-28 DIAGNOSIS — N19 Unspecified kidney failure: Secondary | ICD-10-CM

## 2014-12-28 DIAGNOSIS — C50919 Malignant neoplasm of unspecified site of unspecified female breast: Secondary | ICD-10-CM

## 2014-12-28 DIAGNOSIS — Z79899 Other long term (current) drug therapy: Secondary | ICD-10-CM | POA: Diagnosis not present

## 2014-12-28 LAB — CBC WITH DIFFERENTIAL/PLATELET
Basophils Absolute: 0.1 10*3/uL (ref 0–0.1)
Basophils Relative: 1 %
Eosinophils Absolute: 0.1 10*3/uL (ref 0–0.7)
Eosinophils Relative: 1 %
HCT: 37.8 % (ref 35.0–47.0)
Hemoglobin: 12.2 g/dL (ref 12.0–16.0)
Lymphocytes Relative: 31 %
Lymphs Abs: 2.5 10*3/uL (ref 1.0–3.6)
MCH: 29.1 pg (ref 26.0–34.0)
MCHC: 32.3 g/dL (ref 32.0–36.0)
MCV: 90.1 fL (ref 80.0–100.0)
Monocytes Absolute: 0.7 10*3/uL (ref 0.2–0.9)
Monocytes Relative: 8 %
NEUTROS ABS: 4.8 10*3/uL (ref 1.4–6.5)
NEUTROS PCT: 59 %
PLATELETS: 248 10*3/uL (ref 150–440)
RBC: 4.2 MIL/uL (ref 3.80–5.20)
RDW: 14.6 % — AB (ref 11.5–14.5)
WBC: 8.1 10*3/uL (ref 3.6–11.0)

## 2014-12-28 LAB — COMPREHENSIVE METABOLIC PANEL
ALBUMIN: 3.9 g/dL (ref 3.5–5.0)
ALT: 16 U/L (ref 14–54)
ANION GAP: 5 (ref 5–15)
AST: 21 U/L (ref 15–41)
Alkaline Phosphatase: 88 U/L (ref 38–126)
BUN: 32 mg/dL — AB (ref 6–20)
CALCIUM: 9.4 mg/dL (ref 8.9–10.3)
CHLORIDE: 102 mmol/L (ref 101–111)
CO2: 30 mmol/L (ref 22–32)
Creatinine, Ser: 1.58 mg/dL — ABNORMAL HIGH (ref 0.44–1.00)
GFR calc Af Amer: 32 mL/min — ABNORMAL LOW (ref >60)
GFR, EST NON AFRICAN AMERICAN: 28 mL/min — AB (ref >60)
Glucose, Bld: 135 mg/dL — ABNORMAL HIGH (ref 65–99)
POTASSIUM: 4.5 mmol/L (ref 3.5–5.1)
Sodium: 137 mmol/L (ref 135–145)
Total Bilirubin: 0.5 mg/dL (ref 0.3–1.2)
Total Protein: 7.6 g/dL (ref 6.5–8.1)

## 2014-12-28 NOTE — Progress Notes (Signed)
Patient does not have living will.  Former smoker. 

## 2015-01-01 ENCOUNTER — Encounter: Payer: Self-pay | Admitting: Oncology

## 2015-01-01 DIAGNOSIS — M109 Gout, unspecified: Secondary | ICD-10-CM | POA: Insufficient documentation

## 2015-01-01 DIAGNOSIS — C50919 Malignant neoplasm of unspecified site of unspecified female breast: Secondary | ICD-10-CM

## 2015-01-01 DIAGNOSIS — D369 Benign neoplasm, unspecified site: Secondary | ICD-10-CM | POA: Insufficient documentation

## 2015-01-01 HISTORY — DX: Malignant neoplasm of unspecified site of unspecified female breast: C50.919

## 2015-01-01 NOTE — Progress Notes (Signed)
[place] @ [facility] [phone]  Fax:(336) [place]: 1923/06/22  MR#: [id number]  YBO#:[id number]  Patient Care Team: Sherrin Daisy, MD as PCP - General (Family Medicine)  CHIEF COMPLAINT:  Chief Complaint  Patient presents with  . Follow-up    Oncology History   Chief Complaint/Problem List  1. Recently diagnosed carcinoma of breast, status post modified radical mastectomy.  T1N0M0 tumor. Estrogen/progesterone receptors positive. HER-2/neu 1+.  2. Family history of Von Willebrand's disease and questionable history of bruising. 3. Finished arimidex 1 year ago 4. Anemia secondary to renal failure 5. Carcinoma of left breast, status post modified right mastectomy T1, N0, M0 tumor her estrogen receptor positive. Progesterone receptor positive. HER-2 1+. positive  intramammary lymph node 6. Started on Aromasin from July of 2009 off Aromasin since 2014 7.November, 2015 Patient was hospitalized with history of colovesical fistula.  Status post postdilated working colostomy and resection there was no evidence of maligna     Cancer of breast, female   01/01/2015 Initial Diagnosis Cancer of breast, female    No flowsheet data found.  INTERVAL HISTORY: 81 year old lady with bilateral carcinoma breast.  Anemia due to chronic renal insufficiency.  Multiple other medical problems declining performance status frequent fall.  Increasing dementia.  Patient lives by herself family wanted her to go to assisted living facility only with one of the daughter but patient refuses. Week tired poor appetite and gradually declining performance status REVIEW OF SYSTEMS:   Patient's general condition has been declining.  Has frequent falls.  Increasing dementia.  GI: Poor appetite for active bleeding.  GU: No dysuria hematuria ACS chronic renal insufficiency being followed by nephrologist.  Neurological system: Progressive dementia without any focal sign.  Musculoskeletal  system increasing joint pain muscle pain.  Ambulation is getting the meted.  Cardiac: No abnormality at present time. Lower extremity no swelling. Skin: No rash.  As per HPI. Otherwise, a complete review of systems is negatve.  Significant History/PMH:   Gout:    HTN:    Cancer, Breast:    Hypothyroidism:    Arrythmias:    Anemia:    Angina:    CAD:    Kidney Failure:    anemia:    Glaucoma:    colitis:    gout:    hypothyroidism:    breast cancer:    htn:    Diabetes Mellitus, Type II (NIDD):    irregular heart beat:    Hypercholesterolemia:    Colectomy: Oct 2015   Colitis:    Mastectomy: Bilat   Stent - Cardiac:    right mastectomy:    left mastectomy:   Preventive Screening:  Has patient had any of the following test? Mammography (1)   Last Mammography: bilateral mastectomy(1)   PFSH: Additional Past Medical and Surgical History: Past Medical History   Diabetes  Hypothyroidism  Arthritis  Coronary artery disease  Cataract  Peptic ulcer disease  Coronary artery disease with stent placement in 3/07      Past Surgical History   History of stomach surgery.     Family History   Brother had lung cancer. History of hypertension, blood clots, and diabetes in the family.       Social History   Used to smoke in the past, does not smoke now.  Does not drink alcohol.   ADVANCED DIRECTIVES:  Patient does not have any living will.  Situation was discussed with the family and need for health  care directives have been discussed. HEALTH MAINTENANCE: History  Substance Use Topics  . Smoking status: Not on file  . Smokeless tobacco: Not on file  . Alcohol Use: Not on file      Allergies  Allergen Reactions  . Sulfa Antibiotics Nausea And Vomiting    Current Outpatient Prescriptions  Medication Sig Dispense Refill  . Acetaminophen 500 MG coapsule Take by mouth.    Marland Kitchen aspirin EC 81 MG tablet Take by mouth.    . ferrous fumarate  (FERRO-SEQUELS) 50 MG CR tablet Take 50 mg by mouth 3 (three) times daily with meals.    Marland Kitchen levothyroxine (SYNTHROID, LEVOTHROID) 125 MCG tablet Take by mouth.    . nitroGLYCERIN (NITROSTAT) 0.4 MG SL tablet Place under the tongue.    . nortriptyline (PAMELOR) 10 MG capsule Take by mouth.    Marland Kitchen omeprazole (PRILOSEC) 20 MG capsule Take by mouth.    . sodium bicarbonate 650 MG tablet TAKE 1 TABLET (650 MG TOTAL) BY MOUTH 3 (THREE) TIMES DAILY.     No current facility-administered medications for this visit.    OBJECTIVE:  Filed Vitals:   12/28/14 1501  BP: 167/91  Pulse: 97  Temp: 97 F (36.1 C)     There is no height on file to calculate BMI.    ECOG FS:2 - Symptomatic, <50% confined to bed  PHYSICAL EXAM: Gen. status: Patient is somewhat confused.  But not any acute distress. Lymphatic system: No palpable supraclavicular or cervical axillary adenopathy Lungs: Air entry diminished on both sides.  Cardiac: Tachycardia. Abdominal exam revealed normal bowel sounds. The abdomen was soft, non-tender, and without masses, organomegaly, or appreciable enlargement of the abdominal aorta. Examination of the skin revealed no evidence of significant rashes, suspicious appearing nevi or other concerning lesions. Neurological system no focal sign.  Dementia. Swelling Lower extremity no swelling Examination of the skin revealed no evidence of significant rashes, suspicious appearing nevi or other concerning lesions.    LAB RESULTS:  Appointment on 12/28/2014  Component Date Value Ref Range Status  . WBC 12/28/2014 8.1  3.6 - 11.0 K/uL Final  . RBC 12/28/2014 4.20  3.80 - 5.20 MIL/uL Final  . Hemoglobin 12/28/2014 12.2  12.0 - 16.0 g/dL Final  . HCT 12/28/2014 37.8  35.0 - 47.0 % Final  . MCV 12/28/2014 90.1  80.0 - 100.0 fL Final  . MCH 12/28/2014 29.1  26.0 - 34.0 pg Final  . MCHC 12/28/2014 32.3  32.0 - 36.0 g/dL Final  . RDW 12/28/2014 14.6* 11.5 - 14.5 % Final  . Platelets 12/28/2014  248  150 - 440 K/uL Final  . Neutrophils Relative % 12/28/2014 59   Final  . Neutro Abs 12/28/2014 4.8  1.4 - 6.5 K/uL Final  . Lymphocytes Relative 12/28/2014 31   Final  . Lymphs Abs 12/28/2014 2.5  1.0 - 3.6 K/uL Final  . Monocytes Relative 12/28/2014 8   Final  . Monocytes Absolute 12/28/2014 0.7  0.2 - 0.9 K/uL Final  . Eosinophils Relative 12/28/2014 1   Final  . Eosinophils Absolute 12/28/2014 0.1  0 - 0.7 K/uL Final  . Basophils Relative 12/28/2014 1   Final  . Basophils Absolute 12/28/2014 0.1  0 - 0.1 K/uL Final  . Sodium 12/28/2014 137  135 - 145 mmol/L Final  . Potassium 12/28/2014 4.5  3.5 - 5.1 mmol/L Final  . Chloride 12/28/2014 102  101 - 111 mmol/L Final  . CO2 12/28/2014 30  22 - 32 mmol/L  Final  . Glucose, Bld 12/28/2014 135* 65 - 99 mg/dL Final  . BUN 12/28/2014 32* 6 - 20 mg/dL Final  . Creatinine, Ser 12/28/2014 1.58* 0.44 - 1.00 mg/dL Final  . Calcium 12/28/2014 9.4  8.9 - 10.3 mg/dL Final  . Total Protein 12/28/2014 7.6  6.5 - 8.1 g/dL Final  . Albumin 12/28/2014 3.9  3.5 - 5.0 g/dL Final  . AST 12/28/2014 21  15 - 41 U/L Final  . ALT 12/28/2014 16  14 - 54 U/L Final  . Alkaline Phosphatase 12/28/2014 88  38 - 126 U/L Final  . Total Bilirubin 12/28/2014 0.5  0.3 - 1.2 mg/dL Final  . GFR calc non Af Amer 12/28/2014 28* >60 mL/min Final  . GFR calc Af Amer 12/28/2014 32* >60 mL/min Final   Comment: (NOTE) The eGFR has been calculated using the CKD EPI equation. This calculation has not been validated in all clinical situations. eGFR's persistently <60 mL/min signify possible Chronic Kidney Disease.   . Anion gap 12/28/2014 5  5 - 15 Final      STUDIES: No results found.  ASSESSMENT: Bilateral carcinoma breast status post bilateral mastectomy patient is off letrozole   Anemia due to chronic renal insufficiency and GI bleeding stable   MEDICAL DECISION MAKING:  All lab data has been reviewed. No evidence of recurrent or progressive  disease Chronic renal insufficiency Anemia is stable Progressing dementia Frequent fall Discussed situation with patient's family need for assisted living facility or providing 24 hours caregiving has been discussed at length.  Need for having living will at been discussed.  At present time oncology point of view and hematologic point of view no further treatment has been recommended. I do not see any need for continuing follow this patient is being followed regularly by primary care physician. Total duration of visit was 45 minutes.  50% or more time was spent in counseling patient and family regarding prognosis and options of treatment and available resources  Patient expressed understanding and was in agreement with this plan. She also understands that She can call clinic at any time with any questions, concerns, or complaints.    No matching staging information was found for the patient.  Forest Gleason, MD   01/01/2015 8:59 AM

## 2015-01-26 DIAGNOSIS — R42 Dizziness and giddiness: Secondary | ICD-10-CM | POA: Diagnosis not present

## 2015-01-26 DIAGNOSIS — R252 Cramp and spasm: Secondary | ICD-10-CM | POA: Diagnosis not present

## 2015-01-26 DIAGNOSIS — R51 Headache: Secondary | ICD-10-CM | POA: Diagnosis not present

## 2015-01-26 DIAGNOSIS — R413 Other amnesia: Secondary | ICD-10-CM | POA: Diagnosis not present

## 2015-01-27 DIAGNOSIS — R252 Cramp and spasm: Secondary | ICD-10-CM | POA: Diagnosis not present

## 2015-01-27 DIAGNOSIS — R51 Headache: Secondary | ICD-10-CM | POA: Diagnosis not present

## 2015-01-27 DIAGNOSIS — R413 Other amnesia: Secondary | ICD-10-CM | POA: Diagnosis not present

## 2015-01-27 DIAGNOSIS — R42 Dizziness and giddiness: Secondary | ICD-10-CM | POA: Diagnosis not present

## 2015-02-09 DIAGNOSIS — E538 Deficiency of other specified B group vitamins: Secondary | ICD-10-CM | POA: Diagnosis not present

## 2015-03-09 DIAGNOSIS — E872 Acidosis: Secondary | ICD-10-CM | POA: Diagnosis not present

## 2015-03-09 DIAGNOSIS — N2581 Secondary hyperparathyroidism of renal origin: Secondary | ICD-10-CM | POA: Diagnosis not present

## 2015-03-09 DIAGNOSIS — D631 Anemia in chronic kidney disease: Secondary | ICD-10-CM | POA: Diagnosis not present

## 2015-03-09 DIAGNOSIS — N183 Chronic kidney disease, stage 3 (moderate): Secondary | ICD-10-CM | POA: Diagnosis not present

## 2015-03-09 DIAGNOSIS — I10 Essential (primary) hypertension: Secondary | ICD-10-CM | POA: Diagnosis not present

## 2015-03-15 DIAGNOSIS — E538 Deficiency of other specified B group vitamins: Secondary | ICD-10-CM | POA: Diagnosis not present

## 2015-03-23 DIAGNOSIS — E039 Hypothyroidism, unspecified: Secondary | ICD-10-CM | POA: Diagnosis not present

## 2015-03-23 DIAGNOSIS — H9193 Unspecified hearing loss, bilateral: Secondary | ICD-10-CM | POA: Diagnosis not present

## 2015-03-23 DIAGNOSIS — E780 Pure hypercholesterolemia: Secondary | ICD-10-CM | POA: Diagnosis not present

## 2015-03-23 DIAGNOSIS — E119 Type 2 diabetes mellitus without complications: Secondary | ICD-10-CM | POA: Diagnosis not present

## 2015-03-23 DIAGNOSIS — I701 Atherosclerosis of renal artery: Secondary | ICD-10-CM | POA: Diagnosis not present

## 2015-03-29 ENCOUNTER — Encounter: Payer: Self-pay | Admitting: Oncology

## 2015-03-29 ENCOUNTER — Inpatient Hospital Stay: Payer: Medicare Other | Attending: Oncology

## 2015-03-29 ENCOUNTER — Inpatient Hospital Stay (HOSPITAL_BASED_OUTPATIENT_CLINIC_OR_DEPARTMENT_OTHER): Payer: Medicare Other | Admitting: Oncology

## 2015-03-29 ENCOUNTER — Inpatient Hospital Stay: Payer: Medicare Other

## 2015-03-29 VITALS — BP 145/79 | HR 88 | Temp 95.6°F | Wt 136.5 lb

## 2015-03-29 DIAGNOSIS — F0390 Unspecified dementia without behavioral disturbance: Secondary | ICD-10-CM | POA: Insufficient documentation

## 2015-03-29 DIAGNOSIS — Z9013 Acquired absence of bilateral breasts and nipples: Secondary | ICD-10-CM | POA: Insufficient documentation

## 2015-03-29 DIAGNOSIS — D631 Anemia in chronic kidney disease: Secondary | ICD-10-CM | POA: Diagnosis not present

## 2015-03-29 DIAGNOSIS — D649 Anemia, unspecified: Secondary | ICD-10-CM

## 2015-03-29 DIAGNOSIS — Z79899 Other long term (current) drug therapy: Secondary | ICD-10-CM | POA: Insufficient documentation

## 2015-03-29 DIAGNOSIS — Z170 Estrogen receptor positive status [ER+]: Secondary | ICD-10-CM | POA: Diagnosis not present

## 2015-03-29 DIAGNOSIS — Z87891 Personal history of nicotine dependence: Secondary | ICD-10-CM | POA: Insufficient documentation

## 2015-03-29 DIAGNOSIS — Z853 Personal history of malignant neoplasm of breast: Secondary | ICD-10-CM | POA: Diagnosis not present

## 2015-03-29 DIAGNOSIS — C50919 Malignant neoplasm of unspecified site of unspecified female breast: Secondary | ICD-10-CM

## 2015-03-29 DIAGNOSIS — Z23 Encounter for immunization: Secondary | ICD-10-CM | POA: Insufficient documentation

## 2015-03-29 DIAGNOSIS — N189 Chronic kidney disease, unspecified: Secondary | ICD-10-CM | POA: Diagnosis not present

## 2015-03-29 LAB — COMPREHENSIVE METABOLIC PANEL
ALT: 11 U/L — ABNORMAL LOW (ref 14–54)
AST: 18 U/L (ref 15–41)
Albumin: 3.9 g/dL (ref 3.5–5.0)
Alkaline Phosphatase: 89 U/L (ref 38–126)
Anion gap: 4 — ABNORMAL LOW (ref 5–15)
BILIRUBIN TOTAL: 0.6 mg/dL (ref 0.3–1.2)
BUN: 32 mg/dL — AB (ref 6–20)
CALCIUM: 9.3 mg/dL (ref 8.9–10.3)
CO2: 27 mmol/L (ref 22–32)
Chloride: 105 mmol/L (ref 101–111)
Creatinine, Ser: 1.6 mg/dL — ABNORMAL HIGH (ref 0.44–1.00)
GFR calc Af Amer: 32 mL/min — ABNORMAL LOW (ref >60)
GFR, EST NON AFRICAN AMERICAN: 27 mL/min — AB (ref >60)
Glucose, Bld: 98 mg/dL (ref 65–99)
POTASSIUM: 4 mmol/L (ref 3.5–5.1)
Sodium: 136 mmol/L (ref 135–145)
TOTAL PROTEIN: 7.8 g/dL (ref 6.5–8.1)

## 2015-03-29 LAB — CBC WITH DIFFERENTIAL/PLATELET
Basophils Absolute: 0.1 10*3/uL (ref 0–0.1)
Basophils Relative: 1 %
Eosinophils Absolute: 0.1 10*3/uL (ref 0–0.7)
Eosinophils Relative: 2 %
HCT: 36.2 % (ref 35.0–47.0)
Hemoglobin: 11.9 g/dL — ABNORMAL LOW (ref 12.0–16.0)
LYMPHS PCT: 32 %
Lymphs Abs: 2.4 10*3/uL (ref 1.0–3.6)
MCH: 29.7 pg (ref 26.0–34.0)
MCHC: 33 g/dL (ref 32.0–36.0)
MCV: 90.2 fL (ref 80.0–100.0)
MONO ABS: 0.6 10*3/uL (ref 0.2–0.9)
Monocytes Relative: 8 %
Neutro Abs: 4.4 10*3/uL (ref 1.4–6.5)
Neutrophils Relative %: 57 %
Platelets: 225 10*3/uL (ref 150–440)
RBC: 4.02 MIL/uL (ref 3.80–5.20)
RDW: 14.4 % (ref 11.5–14.5)
WBC: 7.6 10*3/uL (ref 3.6–11.0)

## 2015-03-29 MED ORDER — INFLUENZA VAC SPLIT QUAD 0.5 ML IM SUSY
0.5000 mL | PREFILLED_SYRINGE | Freq: Once | INTRAMUSCULAR | Status: AC
  Administered 2015-03-29: 0.5 mL via INTRAMUSCULAR

## 2015-03-29 NOTE — Progress Notes (Signed)
[place] @ [facility] [phone]  Fax:(336) [place]: 08-12-1922  MR#: [id number]  YTW#:[id number]  Patient Care Team: Sherrin Daisy, MD as PCP - General (Family Medicine)  CHIEF COMPLAINT:  Chief Complaint  Patient presents with  . OTHER    Oncology History   Chief Complaint/Problem List  1. Recently diagnosed carcinoma of breast, status post modified radical mastectomy.  T1N0M0 tumor. Estrogen/progesterone receptors positive. HER-2/neu 1+.  2. Family history of Von Willebrand's disease and questionable history of bruising. 3. Finished arimidex 1 year ago 4. Anemia secondary to renal failure 5. Carcinoma of left breast, status post modified right mastectomy T1, N0, M0 tumor her estrogen receptor positive. Progesterone receptor positive. HER-2 1+. positive  intramammary lymph node 6. Started on Aromasin from July of 2009 off Aromasin since 2014 7.November, 2015 Patient was hospitalized with history of colovesical fistula.  Status post postdilated working colostomy and resection there was no evidence of maligna     Cancer of breast, female   01/01/2015 Initial Diagnosis Cancer of breast, female    No flowsheet data found.  INTERVAL HISTORY: 81 year old lady with bilateral carcinoma breast.  Anemia due to chronic renal insufficiency.  Multiple other medical problems declining performance status frequent fall.  Increasing dementia.  Patient lives by herself family wanted her to go to assisted living facility only with one of the daughter but patient refuses. Week tired poor appetite and gradually declining performance status  March 29, 2015 Patient is here for ongoing evaluation and treatment consideration Patient is now 81 years old Patient is here for follow-up regarding bilateral carcinoma breast Anemia of renal insufficiency previously requiring Procrit Continues to feel somewhat weak and tired. No chills or fever REVIEW OF SYSTEMS:    Patient continues to go weak and tired.  Since last evaluation patient did not have any emergency room visit.  Appetite has been stable.  No nausea.  No vomiting.  No diarrhea.  All other systems have been reviewed (12 system) and no abnormality detected As per HPI. Otherwise, a complete review of systems is negatve.  Significant History/PMH:   Gout:    HTN:    Cancer, Breast:    Hypothyroidism:    Arrythmias:    Anemia:    Angina:    CAD:    Kidney Failure:    anemia:    Glaucoma:    colitis:    gout:    hypothyroidism:    breast cancer:    htn:    Diabetes Mellitus, Type II (NIDD):    irregular heart beat:    Hypercholesterolemia:    Colectomy: Oct 2015   Colitis:    Mastectomy: Bilat   Stent - Cardiac:    right mastectomy:    left mastectomy:   Preventive Screening:  Has patient had any of the following test? Mammography (1)   Last Mammography: bilateral mastectomy(1)   PFSH: Additional Past Medical and Surgical History: Past Medical History   Diabetes  Hypothyroidism  Arthritis  Coronary artery disease  Cataract  Peptic ulcer disease  Coronary artery disease with stent placement in 3/07      Past Surgical History   History of stomach surgery.     Family History   Brother had lung cancer. History of hypertension, blood clots, and diabetes in the family.       Social History   Used to smoke in the past, does not smoke now.  Does not drink alcohol.  ADVANCED DIRECTIVES:  Patient does not have any living will.  Situation was discussed with the family and need for health care directives have been discussed. HEALTH MAINTENANCE: Social History  Substance Use Topics  . Smoking status: Never Smoker   . Smokeless tobacco: None  . Alcohol Use: None      Allergies  Allergen Reactions  . Sulfa Antibiotics Nausea And Vomiting    Current Outpatient Prescriptions  Medication Sig Dispense Refill  . Acetaminophen 500 MG coapsule  Take by mouth.    Marland Kitchen aspirin EC 81 MG tablet Take by mouth.    . ferrous fumarate (FERRO-SEQUELS) 50 MG CR tablet Take 50 mg by mouth 3 (three) times daily with meals.    Marland Kitchen levothyroxine (SYNTHROID, LEVOTHROID) 125 MCG tablet Take by mouth.    . nitroGLYCERIN (NITROSTAT) 0.4 MG SL tablet Place under the tongue.    . nortriptyline (PAMELOR) 10 MG capsule Take by mouth.    Marland Kitchen omeprazole (PRILOSEC) 20 MG capsule Take by mouth.    . sodium bicarbonate 650 MG tablet TAKE 1 TABLET (650 MG TOTAL) BY MOUTH 3 (THREE) TIMES DAILY.     No current facility-administered medications for this visit.   Facility-Administered Medications Ordered in Other Visits  Medication Dose Route Frequency Provider Last Rate Last Dose  . Influenza vac split quadrivalent PF (FLUARIX) injection 0.5 mL  0.5 mL Intramuscular Once Johney Maine, MD        OBJECTIVE:  Filed Vitals:   03/29/15 1458  BP: 145/79  Pulse: 88  Temp: 95.6 F (35.3 C)     There is no height on file to calculate BMI.    ECOG FS:2 - Symptomatic, <50% confined to bed  PHYSICAL EXAM: Gen. status: Patient is somewhat confused.  But not any acute distress. Lymphatic system: No palpable supraclavicular or cervical axillary adenopathy Lungs: Air entry diminished on both sides.  Chest wall area there is no evidence of recurrent disease Cardiac: Tachycardia. Abdominal exam revealed normal bowel sounds. The abdomen was soft, non-tender, and without masses, organomegaly, or appreciable enlargement of the abdominal aorta. Examination of the skin revealed no evidence of significant rashes, suspicious appearing nevi or other concerning lesions. Neurological system no focal sign.  Dementia. Swelling Lower extremity no swelling Examination of the skin revealed no evidence of significant rashes, suspicious appearing nevi or other concerning lesions.    LAB RESULTS:  Appointment on 03/29/2015  Component Date Value Ref Range Status  . WBC 03/29/2015 7.6   3.6 - 11.0 K/uL Final  . RBC 03/29/2015 4.02  3.80 - 5.20 MIL/uL Final  . Hemoglobin 03/29/2015 11.9* 12.0 - 16.0 g/dL Final  . HCT 57/61/3093 36.2  35.0 - 47.0 % Final  . MCV 03/29/2015 90.2  80.0 - 100.0 fL Final  . MCH 03/29/2015 29.7  26.0 - 34.0 pg Final  . MCHC 03/29/2015 33.0  32.0 - 36.0 g/dL Final  . RDW 86/89/2083 14.4  11.5 - 14.5 % Final  . Platelets 03/29/2015 225  150 - 440 K/uL Final  . Neutrophils Relative % 03/29/2015 57   Final  . Neutro Abs 03/29/2015 4.4  1.4 - 6.5 K/uL Final  . Lymphocytes Relative 03/29/2015 32   Final  . Lymphs Abs 03/29/2015 2.4  1.0 - 3.6 K/uL Final  . Monocytes Relative 03/29/2015 8   Final  . Monocytes Absolute 03/29/2015 0.6  0.2 - 0.9 K/uL Final  . Eosinophils Relative 03/29/2015 2   Final  . Eosinophils Absolute 03/29/2015 0.1  0 - 0.7 K/uL Final  . Basophils Relative 03/29/2015 1   Final  . Basophils Absolute 03/29/2015 0.1  0 - 0.1 K/uL Final  . Sodium 03/29/2015 136  135 - 145 mmol/L Final  . Potassium 03/29/2015 4.0  3.5 - 5.1 mmol/L Final  . Chloride 03/29/2015 105  101 - 111 mmol/L Final  . CO2 03/29/2015 27  22 - 32 mmol/L Final  . Glucose, Bld 03/29/2015 98  65 - 99 mg/dL Final  . BUN 03/29/2015 32* 6 - 20 mg/dL Final  . Creatinine, Ser 03/29/2015 1.60* 0.44 - 1.00 mg/dL Final  . Calcium 03/29/2015 9.3  8.9 - 10.3 mg/dL Final  . Total Protein 03/29/2015 7.8  6.5 - 8.1 g/dL Final  . Albumin 03/29/2015 3.9  3.5 - 5.0 g/dL Final  . AST 03/29/2015 18  15 - 41 U/L Final  . ALT 03/29/2015 11* 14 - 54 U/L Final  . Alkaline Phosphatase 03/29/2015 89  38 - 126 U/L Final  . Total Bilirubin 03/29/2015 0.6  0.3 - 1.2 mg/dL Final  . GFR calc non Af Amer 03/29/2015 27* >60 mL/min Final  . GFR calc Af Amer 03/29/2015 32* >60 mL/min Final   Comment: (NOTE) The eGFR has been calculated using the CKD EPI equation. This calculation has not been validated in all clinical situations. eGFR's persistently <60 mL/min signify possible Chronic  Kidney Disease.   . Anion gap 03/29/2015 4* 5 - 15 Final      ASSESSMENT: Bilateral carcinoma breast status post bilateral mastectomy patient is off letrozole On clinical ground there is no evidence of recurrent disease   Anemia due to chronic renal insufficiency and GI bleeding stable She  does not need anymore Procrit injection. Proceed with the flu shot Reevaluation in 6 month  MEDICAL DECISION MAKING:  All lab data has been reviewed.   No matching staging information was found for the patient.  Forest Gleason, MD   03/29/2015 3:34 PM

## 2015-03-29 NOTE — Progress Notes (Signed)
Patient does not have living will.  Never smoked. 

## 2015-04-05 DIAGNOSIS — H903 Sensorineural hearing loss, bilateral: Secondary | ICD-10-CM | POA: Diagnosis not present

## 2015-04-12 ENCOUNTER — Telehealth: Payer: Self-pay | Admitting: *Deleted

## 2015-04-12 NOTE — Telephone Encounter (Signed)
Prescription faxed to [facility].

## 2015-04-12 NOTE — Telephone Encounter (Signed)
Patient is requesting prescription for prosthetic bras.  Please fax prescription to [facility] Fax # [phone].

## 2015-04-19 IMAGING — CT CT HEAD WITHOUT CONTRAST
1 series · 16 of 30 positions shown, 20 images · non-contrast
Comparison: Head CT 03/03/2014

CLINICAL DATA: Altered mental status. Patient thinks she is at
[facility].

EXAM:
CT HEAD WITHOUT CONTRAST
TECHNIQUE: Contiguous axial images were obtained from the base of the skull
through the vertex without intravenous contrast.

[Series 2: soft tissue · axial · 0.40mm/px · z∈[+295,+430]mm · 16 of 30 slices shown, 20 images]
[im 2/30  brain]
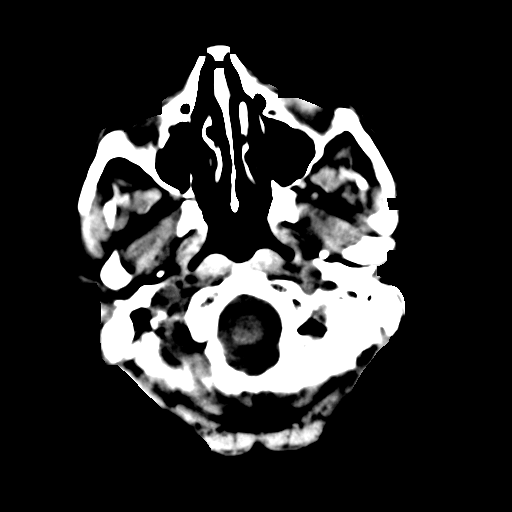
[im 2/30  bone]
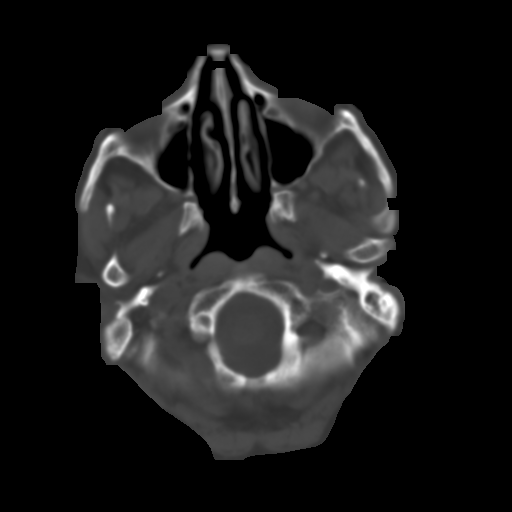
[im 4/30  brain]
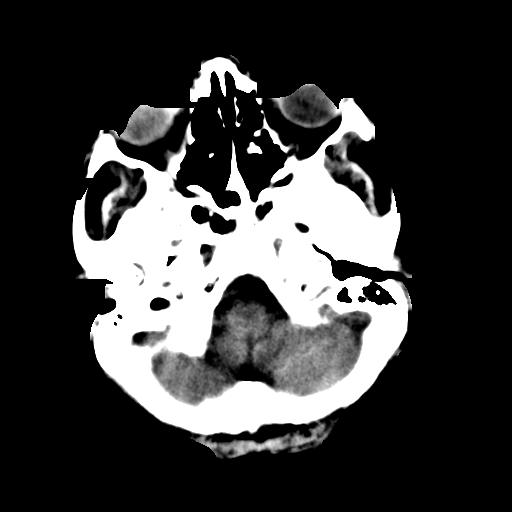
[im 6/30  brain]
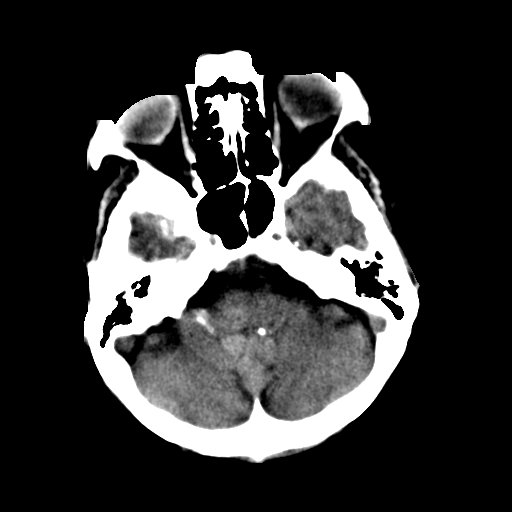
[im 8/30  brain]
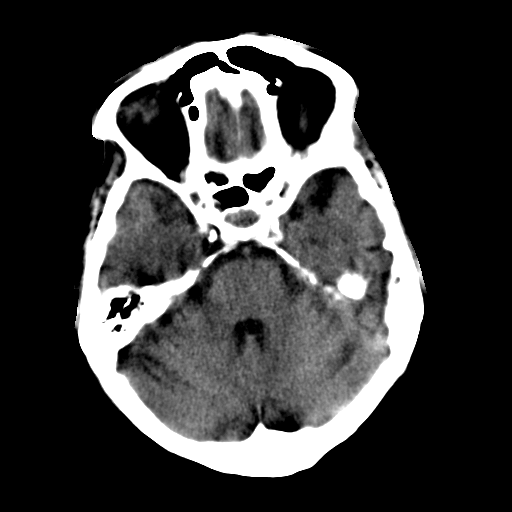
[im 9/30  brain]
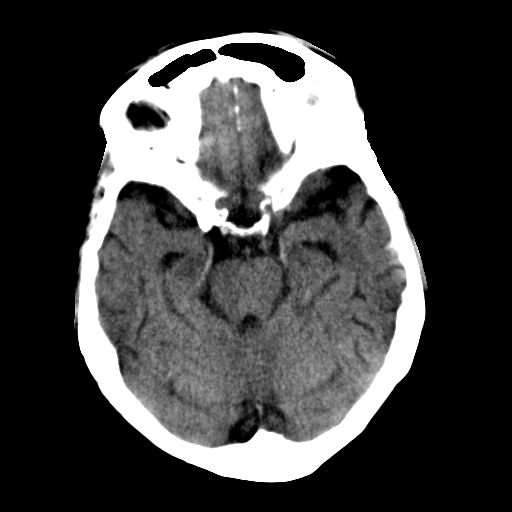
[im 9/30  bone]
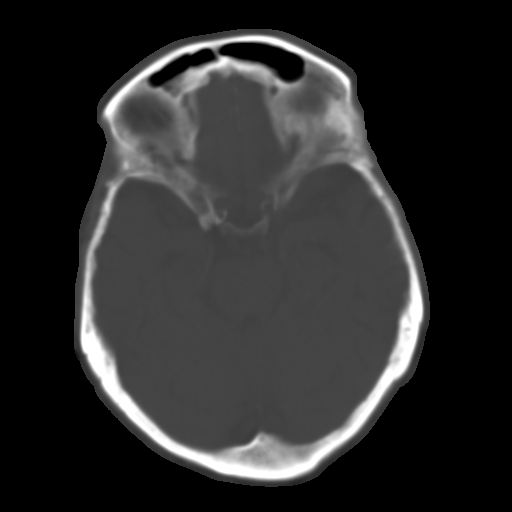
[im 11/30  brain]
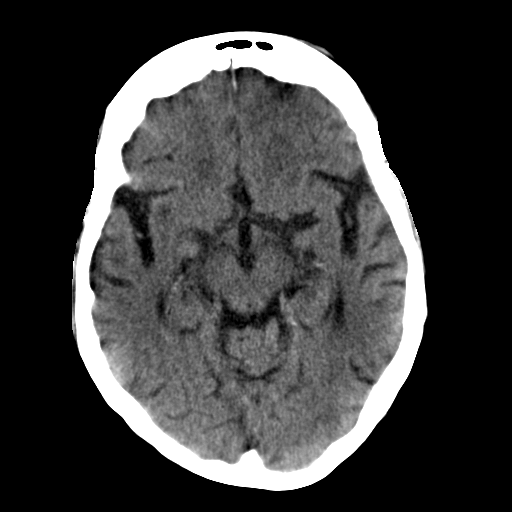
[im 13/30  brain]
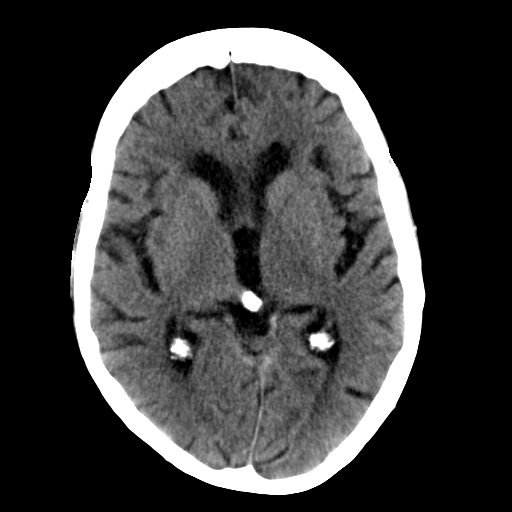
[im 15/30  brain]
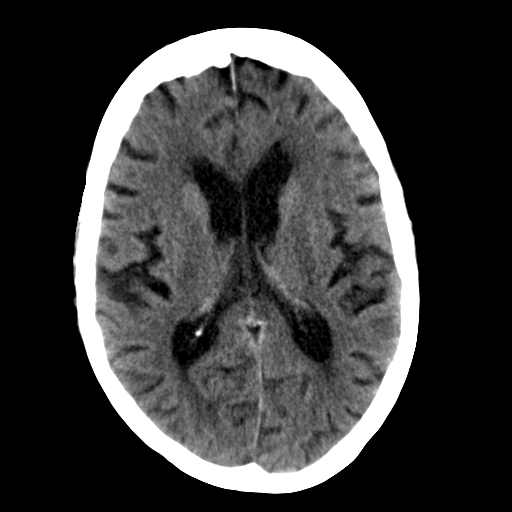
[im 16/30  brain]
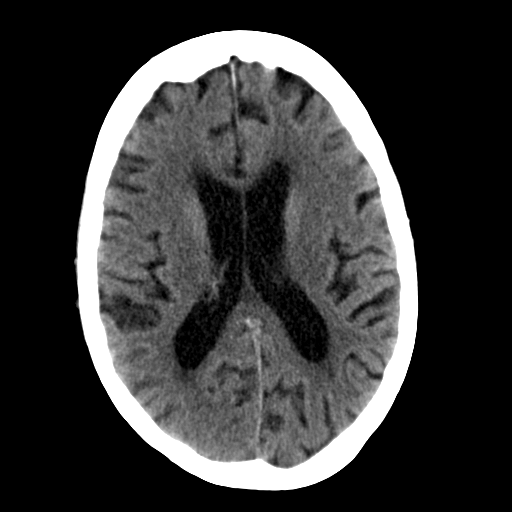
[im 16/30  bone]
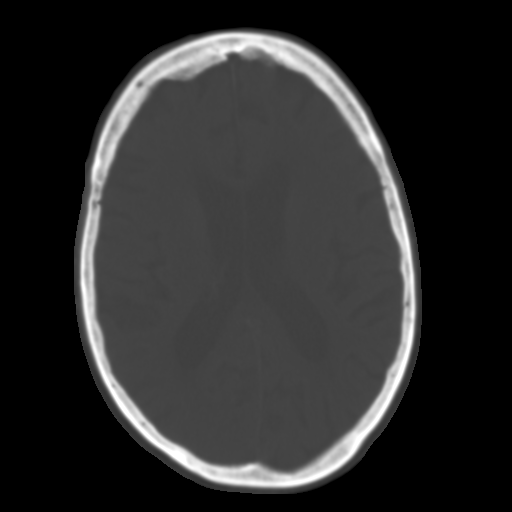
[im 18/30  brain]
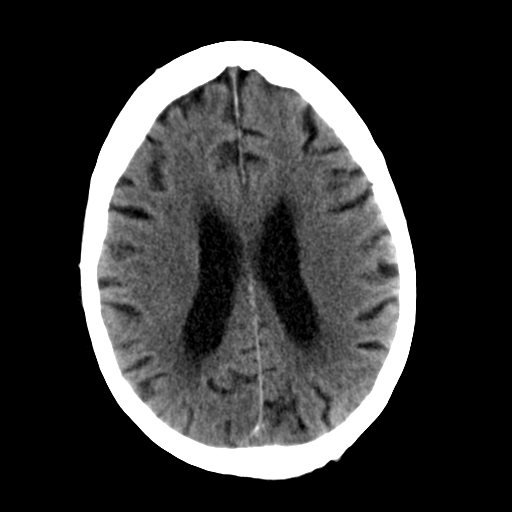
[im 20/30  brain]
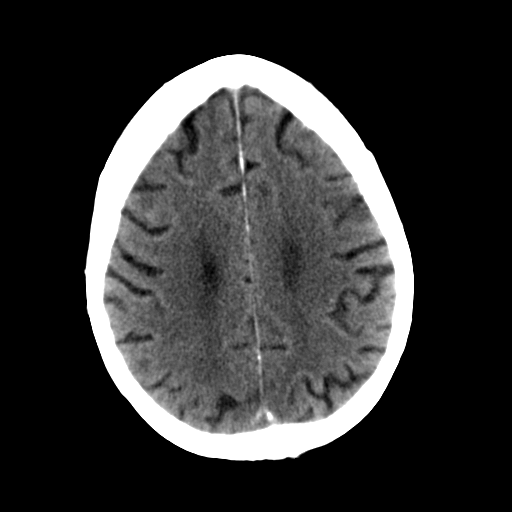
[im 22/30  brain]
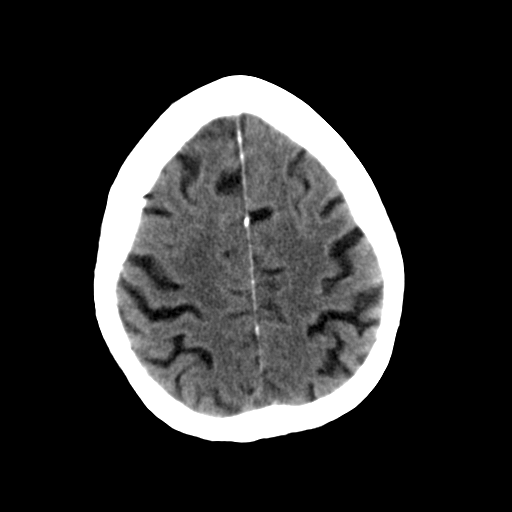
[im 23/30  brain]
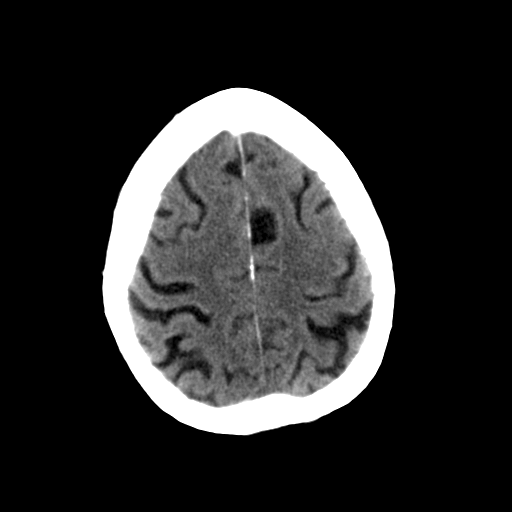
[im 23/30  bone]
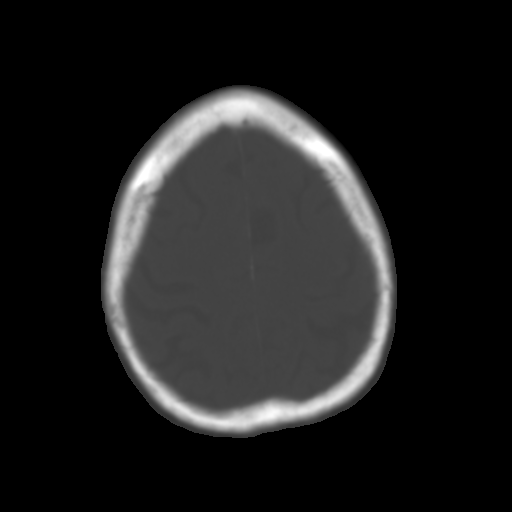
[im 25/30  brain]
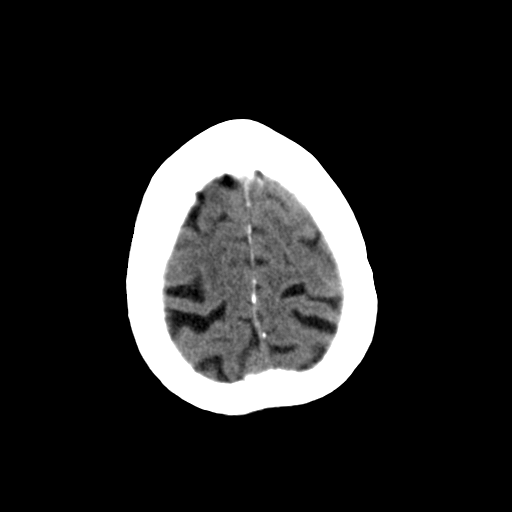
[im 27/30  brain]
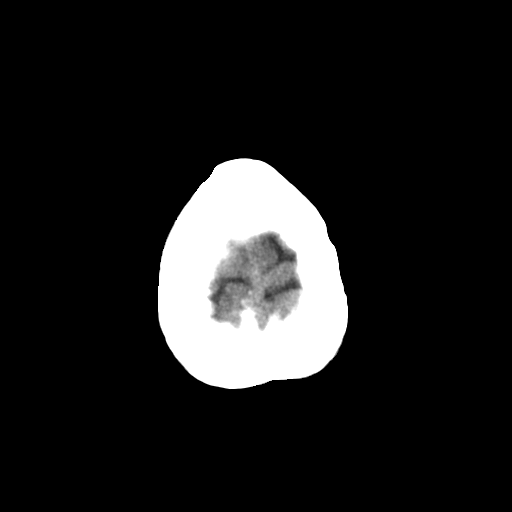
[im 29/30  brain]
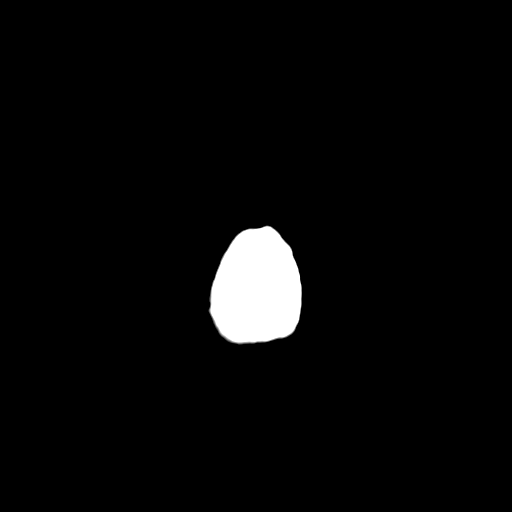

[16 of 30 positions shown; findings below may reference images not displayed]

FINDINGS: No acute intracranial hemorrhage. No focal mass lesion. No CT
evidence of acute infarction. No midline shift or mass effect. No
hydrocephalus. Basilar cisterns are patent.

There is generalized cortical atrophy not changed from prior. There
is extensive periventricular subcortical white matter hypodensities
also unchanged.

There is a small amount of gas collecting non dependently within the
left anterior lateral ventricle (image 14, series 2). Paranasal
sinuses and mastoid air cells are clear.
IMPRESSION: 1. No acute intracranial findings.
2. Atrophy and white matter microvascular disease are unchanged.
3. Small volume of gas in the left anterior ventricle may relate to
lumbar puncture. If no lumbar puncture, recommend correlation with
history of trauma or surgery.

## 2015-04-23 DIAGNOSIS — I10 Essential (primary) hypertension: Secondary | ICD-10-CM | POA: Diagnosis not present

## 2015-04-23 DIAGNOSIS — E7800 Pure hypercholesterolemia, unspecified: Secondary | ICD-10-CM | POA: Diagnosis not present

## 2015-04-23 DIAGNOSIS — I25118 Atherosclerotic heart disease of native coronary artery with other forms of angina pectoris: Secondary | ICD-10-CM | POA: Diagnosis not present

## 2015-04-23 DIAGNOSIS — R072 Precordial pain: Secondary | ICD-10-CM | POA: Diagnosis not present

## 2015-04-28 DIAGNOSIS — R51 Headache: Secondary | ICD-10-CM | POA: Diagnosis not present

## 2015-04-28 DIAGNOSIS — R252 Cramp and spasm: Secondary | ICD-10-CM | POA: Diagnosis not present

## 2015-04-28 DIAGNOSIS — E538 Deficiency of other specified B group vitamins: Secondary | ICD-10-CM | POA: Diagnosis not present

## 2015-04-28 DIAGNOSIS — R413 Other amnesia: Secondary | ICD-10-CM | POA: Diagnosis not present

## 2015-04-30 DIAGNOSIS — E538 Deficiency of other specified B group vitamins: Secondary | ICD-10-CM | POA: Insufficient documentation

## 2015-04-30 DIAGNOSIS — R51 Headache: Secondary | ICD-10-CM

## 2015-04-30 DIAGNOSIS — R519 Headache, unspecified: Secondary | ICD-10-CM | POA: Insufficient documentation

## 2015-07-06 DIAGNOSIS — I10 Essential (primary) hypertension: Secondary | ICD-10-CM | POA: Diagnosis not present

## 2015-07-06 DIAGNOSIS — N184 Chronic kidney disease, stage 4 (severe): Secondary | ICD-10-CM | POA: Diagnosis not present

## 2015-07-06 DIAGNOSIS — N2581 Secondary hyperparathyroidism of renal origin: Secondary | ICD-10-CM | POA: Diagnosis not present

## 2015-07-06 DIAGNOSIS — D631 Anemia in chronic kidney disease: Secondary | ICD-10-CM | POA: Diagnosis not present

## 2015-07-06 DIAGNOSIS — E872 Acidosis: Secondary | ICD-10-CM | POA: Diagnosis not present

## 2015-07-29 DIAGNOSIS — F0390 Unspecified dementia without behavioral disturbance: Secondary | ICD-10-CM | POA: Diagnosis not present

## 2015-07-29 DIAGNOSIS — E538 Deficiency of other specified B group vitamins: Secondary | ICD-10-CM | POA: Diagnosis not present

## 2015-07-29 DIAGNOSIS — F03B0 Unspecified dementia, moderate, without behavioral disturbance, psychotic disturbance, mood disturbance, and anxiety: Secondary | ICD-10-CM | POA: Insufficient documentation

## 2015-07-29 DIAGNOSIS — R51 Headache: Secondary | ICD-10-CM | POA: Diagnosis not present

## 2015-07-29 DIAGNOSIS — R252 Cramp and spasm: Secondary | ICD-10-CM | POA: Diagnosis not present

## 2015-08-19 DIAGNOSIS — R2681 Unsteadiness on feet: Secondary | ICD-10-CM | POA: Diagnosis not present

## 2015-08-19 DIAGNOSIS — I10 Essential (primary) hypertension: Secondary | ICD-10-CM | POA: Diagnosis not present

## 2015-08-19 DIAGNOSIS — I471 Supraventricular tachycardia: Secondary | ICD-10-CM | POA: Diagnosis not present

## 2015-08-19 DIAGNOSIS — I6523 Occlusion and stenosis of bilateral carotid arteries: Secondary | ICD-10-CM | POA: Diagnosis not present

## 2015-09-16 DIAGNOSIS — H401132 Primary open-angle glaucoma, bilateral, moderate stage: Secondary | ICD-10-CM | POA: Diagnosis not present

## 2015-09-16 DIAGNOSIS — H04123 Dry eye syndrome of bilateral lacrimal glands: Secondary | ICD-10-CM | POA: Diagnosis not present

## 2015-09-16 DIAGNOSIS — Z961 Presence of intraocular lens: Secondary | ICD-10-CM | POA: Diagnosis not present

## 2015-09-20 DIAGNOSIS — M5136 Other intervertebral disc degeneration, lumbar region: Secondary | ICD-10-CM | POA: Diagnosis not present

## 2015-09-20 DIAGNOSIS — E119 Type 2 diabetes mellitus without complications: Secondary | ICD-10-CM | POA: Diagnosis not present

## 2015-09-20 DIAGNOSIS — E039 Hypothyroidism, unspecified: Secondary | ICD-10-CM | POA: Diagnosis not present

## 2015-09-20 DIAGNOSIS — Z85828 Personal history of other malignant neoplasm of skin: Secondary | ICD-10-CM | POA: Diagnosis not present

## 2015-09-20 DIAGNOSIS — M545 Low back pain: Secondary | ICD-10-CM | POA: Diagnosis not present

## 2015-09-20 DIAGNOSIS — S3992XA Unspecified injury of lower back, initial encounter: Secondary | ICD-10-CM | POA: Diagnosis not present

## 2015-09-20 DIAGNOSIS — E7800 Pure hypercholesterolemia, unspecified: Secondary | ICD-10-CM | POA: Diagnosis not present

## 2015-09-20 DIAGNOSIS — L570 Actinic keratosis: Secondary | ICD-10-CM | POA: Diagnosis not present

## 2015-09-20 DIAGNOSIS — M533 Sacrococcygeal disorders, not elsewhere classified: Secondary | ICD-10-CM | POA: Diagnosis not present

## 2015-09-20 DIAGNOSIS — Z08 Encounter for follow-up examination after completed treatment for malignant neoplasm: Secondary | ICD-10-CM | POA: Diagnosis not present

## 2015-09-27 ENCOUNTER — Inpatient Hospital Stay: Payer: Medicare Other

## 2015-09-27 ENCOUNTER — Inpatient Hospital Stay: Payer: Medicare Other | Admitting: Oncology

## 2015-10-26 DIAGNOSIS — I10 Essential (primary) hypertension: Secondary | ICD-10-CM | POA: Diagnosis not present

## 2015-10-26 DIAGNOSIS — N184 Chronic kidney disease, stage 4 (severe): Secondary | ICD-10-CM | POA: Diagnosis not present

## 2015-10-26 DIAGNOSIS — E872 Acidosis: Secondary | ICD-10-CM | POA: Diagnosis not present

## 2015-10-26 DIAGNOSIS — M25819 Other specified joint disorders, unspecified shoulder: Secondary | ICD-10-CM | POA: Diagnosis not present

## 2015-10-26 DIAGNOSIS — D631 Anemia in chronic kidney disease: Secondary | ICD-10-CM | POA: Diagnosis not present

## 2015-10-27 ENCOUNTER — Other Ambulatory Visit: Payer: Medicare Other

## 2015-10-27 ENCOUNTER — Inpatient Hospital Stay: Payer: Medicare Other | Attending: Family Medicine | Admitting: Family Medicine

## 2015-10-27 ENCOUNTER — Ambulatory Visit: Payer: Medicare Other

## 2015-10-27 ENCOUNTER — Ambulatory Visit: Payer: Medicare Other | Admitting: Oncology

## 2015-10-27 ENCOUNTER — Inpatient Hospital Stay: Payer: Medicare Other

## 2015-10-27 VITALS — BP 146/83 | HR 108 | Temp 98.0°F | Wt 144.2 lb

## 2015-10-27 DIAGNOSIS — N321 Vesicointestinal fistula: Secondary | ICD-10-CM | POA: Insufficient documentation

## 2015-10-27 DIAGNOSIS — I10 Essential (primary) hypertension: Secondary | ICD-10-CM | POA: Diagnosis not present

## 2015-10-27 DIAGNOSIS — N184 Chronic kidney disease, stage 4 (severe): Secondary | ICD-10-CM | POA: Diagnosis not present

## 2015-10-27 DIAGNOSIS — Z853 Personal history of malignant neoplasm of breast: Secondary | ICD-10-CM | POA: Diagnosis not present

## 2015-10-27 DIAGNOSIS — Z79899 Other long term (current) drug therapy: Secondary | ICD-10-CM | POA: Insufficient documentation

## 2015-10-27 DIAGNOSIS — Z9013 Acquired absence of bilateral breasts and nipples: Secondary | ICD-10-CM | POA: Insufficient documentation

## 2015-10-27 DIAGNOSIS — N2581 Secondary hyperparathyroidism of renal origin: Secondary | ICD-10-CM | POA: Diagnosis not present

## 2015-10-27 DIAGNOSIS — D631 Anemia in chronic kidney disease: Secondary | ICD-10-CM | POA: Insufficient documentation

## 2015-10-27 DIAGNOSIS — E872 Acidosis: Secondary | ICD-10-CM | POA: Diagnosis not present

## 2015-10-27 DIAGNOSIS — N189 Chronic kidney disease, unspecified: Secondary | ICD-10-CM | POA: Diagnosis not present

## 2015-10-27 DIAGNOSIS — Z7982 Long term (current) use of aspirin: Secondary | ICD-10-CM | POA: Insufficient documentation

## 2015-10-27 DIAGNOSIS — C50919 Malignant neoplasm of unspecified site of unspecified female breast: Secondary | ICD-10-CM

## 2015-10-27 DIAGNOSIS — Z933 Colostomy status: Secondary | ICD-10-CM | POA: Diagnosis not present

## 2015-10-27 DIAGNOSIS — Z170 Estrogen receptor positive status [ER+]: Secondary | ICD-10-CM | POA: Insufficient documentation

## 2015-10-27 LAB — COMPREHENSIVE METABOLIC PANEL
ALT: 14 U/L (ref 14–54)
AST: 17 U/L (ref 15–41)
Albumin: 4 g/dL (ref 3.5–5.0)
Alkaline Phosphatase: 99 U/L (ref 38–126)
Anion gap: 10 (ref 5–15)
BILIRUBIN TOTAL: 0.6 mg/dL (ref 0.3–1.2)
BUN: 29 mg/dL — AB (ref 6–20)
CALCIUM: 10.2 mg/dL (ref 8.9–10.3)
CHLORIDE: 105 mmol/L (ref 101–111)
CO2: 26 mmol/L (ref 22–32)
CREATININE: 1.84 mg/dL — AB (ref 0.44–1.00)
GFR calc Af Amer: 27 mL/min — ABNORMAL LOW (ref >60)
GFR, EST NON AFRICAN AMERICAN: 23 mL/min — AB (ref >60)
Glucose, Bld: 133 mg/dL — ABNORMAL HIGH (ref 65–99)
Potassium: 4.6 mmol/L (ref 3.5–5.1)
Sodium: 141 mmol/L (ref 135–145)
Total Protein: 7.7 g/dL (ref 6.5–8.1)

## 2015-10-27 LAB — CBC WITH DIFFERENTIAL/PLATELET
BASOS PCT: 1 %
Basophils Absolute: 0.1 10*3/uL (ref 0–0.1)
Eosinophils Absolute: 0.1 10*3/uL (ref 0–0.7)
Eosinophils Relative: 1 %
HEMATOCRIT: 37.4 % (ref 35.0–47.0)
Hemoglobin: 12.5 g/dL (ref 12.0–16.0)
LYMPHS ABS: 2.7 10*3/uL (ref 1.0–3.6)
Lymphocytes Relative: 28 %
MCH: 30 pg (ref 26.0–34.0)
MCHC: 33.4 g/dL (ref 32.0–36.0)
MCV: 89.8 fL (ref 80.0–100.0)
Monocytes Absolute: 0.7 10*3/uL (ref 0.2–0.9)
Monocytes Relative: 8 %
NEUTROS PCT: 62 %
Neutro Abs: 6 10*3/uL (ref 1.4–6.5)
PLATELETS: 243 10*3/uL (ref 150–440)
RBC: 4.17 MIL/uL (ref 3.80–5.20)
RDW: 14.2 % (ref 11.5–14.5)
WBC: 9.6 10*3/uL (ref 3.6–11.0)

## 2015-10-27 NOTE — Progress Notes (Signed)
Mariah Watkins  [phone]  Fax:([phone]) [place] DOB: 08-Sep-1922  MR#: [id number]  JYN#:[id number]  Patient Care Team: Sherrin Daisy, MD as PCP - General (Family Medicine)  CHIEF COMPLAINT:  Carcinoma of breast  INTERVAL HISTORY:  Patient is here for continued evaluation and treatment consideration regarding bilateral carcinoma of breast. Patient also has a history of anemia due to chronic renal insufficiency. Patient is currently feeling very well and denies any acute complaints. She does have a daughter with her whom she lives with he reports some progressive memory issues. She has not had any acute emergency room visits and continues to follow-up with her primary care provider frequently. She has some very mild leg swelling but this resolves with elevation and rest.  REVIEW OF SYSTEMS:   Review of Systems  Constitutional: Positive for malaise/fatigue. Negative for fever, chills, weight loss and diaphoresis.  HENT: Negative.   Eyes: Negative.   Respiratory: Negative for cough, hemoptysis, sputum production, shortness of breath and wheezing.   Cardiovascular: Positive for leg swelling. Negative for chest pain, palpitations, orthopnea, claudication and PND.  Gastrointestinal: Negative for heartburn, nausea, vomiting, abdominal pain, diarrhea, constipation, blood in stool and melena.  Genitourinary: Negative.   Musculoskeletal: Negative.   Skin: Negative.   Neurological: Negative for dizziness, tingling, focal weakness, seizures and weakness.  Endo/Heme/Allergies: Does not bruise/bleed easily.  Psychiatric/Behavioral: Negative for depression. The patient is not nervous/anxious and does not have insomnia.     As per HPI. Otherwise, a complete review of systems is negatve.  ONCOLOGY HISTORY: Oncology History   Chief Complaint/Problem List  1. Recently diagnosed carcinoma of breast, status post modified radical mastectomy.  T1N0M0 tumor.  Estrogen/progesterone receptors positive. HER-2/neu 1+.  2. Family history of Von Willebrand's disease and questionable history of bruising. 3. Finished arimidex 1 year ago 4. Anemia secondary to renal failure 5. Carcinoma of left breast, status post modified right mastectomy T1, N0, M0 tumor her estrogen receptor positive. Progesterone receptor positive. HER-2 1+. positive  intramammary lymph node 6. Started on Aromasin from July of 2009- off Aromasin since 2014 7.November, 2015- Patient was hospitalized with history of colovesical fistula.  Status post postdilated working colostomy and resection there was no evidence of malignancy     Cancer of breast, female ([place])   01/01/2015 Initial Diagnosis Cancer of breast, female    PAST MEDICAL HISTORY: Past Medical History  Diagnosis Date  . Cancer of breast, female 01/01/2015    PAST SURGICAL HISTORY: No past surgical history on file.  FAMILY HISTORY No family history on file.  GYNECOLOGIC HISTORY:  No LMP recorded.     ADVANCED DIRECTIVES:    HEALTH MAINTENANCE: Social History  Substance Use Topics  . Smoking status: Never Smoker   . Smokeless tobacco: Not on file  . Alcohol Use: Not on file    Allergies  Allergen Reactions  . Sulfa Antibiotics Nausea And Vomiting    Current Outpatient Prescriptions  Medication Sig Dispense Refill  . memantine (NAMENDA) 5 MG tablet Take by mouth.    . nortriptyline (PAMELOR) 10 MG capsule     . Acetaminophen 500 MG coapsule Take by mouth.    Marland Kitchen aspirin EC 81 MG tablet Take by mouth.    . Cyanocobalamin (RA VITAMIN B-12 TR) 1000 MCG TBCR Take by mouth.    . ferrous fumarate (FERRO-SEQUELS) 50 MG CR tablet Take 50 mg by mouth 3 (three) times daily with meals.    Marland Kitchen  levothyroxine (SYNTHROID, LEVOTHROID) 125 MCG tablet Take by mouth.    . nitroGLYCERIN (NITROSTAT) 0.4 MG SL tablet Place under the tongue.    . nortriptyline (PAMELOR) 10 MG capsule Take by mouth.    Marland Kitchen omeprazole (PRILOSEC) 20  MG capsule Take by mouth.    . sodium bicarbonate 650 MG tablet TAKE 1 TABLET (650 MG TOTAL) BY MOUTH 3 (THREE) TIMES DAILY.     No current facility-administered medications for this visit.    OBJECTIVE: BP 146/83 mmHg  Pulse 108  Temp(Src) 98 F (36.7 C)  Wt 144 lb 2.9 oz (65.4 kg)   There is no height on file to calculate BMI.    ECOG FS:1 - Symptomatic but completely ambulatory  General: Well-developed, well-nourished, no acute distress. Eyes: Pink conjunctiva, anicteric sclera. HEENT: Normocephalic, moist mucous membranes, clear oropharnyx. Lungs: Clear to auscultation bilaterally. Heart: Regular rate and rhythm. No rubs, murmurs, or gallops. Abdomen: Soft, nontender, nondistended. No organomegaly noted, normoactive bowel sounds. Breast: Bilateral mastectomy. Chest wall and axilla palpated in both positions with no masses or fullness present. Musculoskeletal: Trace pedal edema, cyanosis, or clubbing. Neuro: Alert, answering all questions appropriately. Cranial nerves grossly intact. Skin: No rashes or petechiae noted. Psych: Normal affect. Lymphatics: No cervical, clavicular, or axillary LAD.   LAB RESULTS:  Appointment on 10/27/2015  Component Date Value Ref Range Status  . WBC 10/27/2015 9.6  3.6 - 11.0 K/uL Final  . RBC 10/27/2015 4.17  3.80 - 5.20 MIL/uL Final  . Hemoglobin 10/27/2015 12.5  12.0 - 16.0 g/dL Final  . HCT 10/27/2015 37.4  35.0 - 47.0 % Final  . MCV 10/27/2015 89.8  80.0 - 100.0 fL Final  . MCH 10/27/2015 30.0  26.0 - 34.0 pg Final  . MCHC 10/27/2015 33.4  32.0 - 36.0 g/dL Final  . RDW 10/27/2015 14.2  11.5 - 14.5 % Final  . Platelets 10/27/2015 243  150 - 440 K/uL Final  . Neutrophils Relative % 10/27/2015 62   Final  . Neutro Abs 10/27/2015 6.0  1.4 - 6.5 K/uL Final  . Lymphocytes Relative 10/27/2015 28   Final  . Lymphs Abs 10/27/2015 2.7  1.0 - 3.6 K/uL Final  . Monocytes Relative 10/27/2015 8   Final  . Monocytes Absolute 10/27/2015 0.7  0.2 -  0.9 K/uL Final  . Eosinophils Relative 10/27/2015 1   Final  . Eosinophils Absolute 10/27/2015 0.1  0 - 0.7 K/uL Final  . Basophils Relative 10/27/2015 1   Final  . Basophils Absolute 10/27/2015 0.1  0 - 0.1 K/uL Final  . Sodium 10/27/2015 141  135 - 145 mmol/L Final  . Potassium 10/27/2015 4.6  3.5 - 5.1 mmol/L Final  . Chloride 10/27/2015 105  101 - 111 mmol/L Final  . CO2 10/27/2015 26  22 - 32 mmol/L Final  . Glucose, Bld 10/27/2015 133* 65 - 99 mg/dL Final  . BUN 10/27/2015 29* 6 - 20 mg/dL Final  . Creatinine, Ser 10/27/2015 1.84* 0.44 - 1.00 mg/dL Final  . Calcium 10/27/2015 10.2  8.9 - 10.3 mg/dL Final  . Total Protein 10/27/2015 7.7  6.5 - 8.1 g/dL Final  . Albumin 10/27/2015 4.0  3.5 - 5.0 g/dL Final  . AST 10/27/2015 17  15 - 41 U/L Final  . ALT 10/27/2015 14  14 - 54 U/L Final  . Alkaline Phosphatase 10/27/2015 99  38 - 126 U/L Final  . Total Bilirubin 10/27/2015 0.6  0.3 - 1.2 mg/dL Final  . GFR calc  non Af Amer 10/27/2015 23* >60 mL/min Final  . GFR calc Af Amer 10/27/2015 27* >60 mL/min Final   Comment: (NOTE) The eGFR has been calculated using the CKD EPI equation. This calculation has not been validated in all clinical situations. eGFR's persistently <60 mL/min signify possible Chronic Kidney Disease.   . Anion gap 10/27/2015 10  5 - 15 Final    STUDIES: No results found.  ASSESSMENT:  Carcinoma of both left and right breast, right breast, stage I T1 N0 M0, ER/PR positive HER-2 nu 1+. Left breast, stage I, T1 N1 M0 ER/PR positive HER-2/neu 1+. Anemia due to chronic renal insufficiency.  PLAN:   1. Bilateral breast cancers. Patient is status post bilateral mastectomy. She is also completed courses of aromatase inhibitors. Clinically there is no evidence of recurrent or progressive disease. 2. Anemia due to chronic renal insufficiency. Patient was previously on Procrit injections but these have since been discontinued. Her hemoglobin today is 12.5 and she reports  feeling very well.  We will continue with routine follow-up in approximately one year.  Patient expressed understanding and was in agreement with this plan. She also understands that She can call clinic at any time with any questions, concerns, or complaints.   Dr. Oliva Bustard was available for consultation and review of plan of care for this patient.    Evlyn Kanner, NP   10/27/2015 2:59 PM

## 2015-11-12 DIAGNOSIS — E119 Type 2 diabetes mellitus without complications: Secondary | ICD-10-CM | POA: Diagnosis not present

## 2015-11-12 DIAGNOSIS — E785 Hyperlipidemia, unspecified: Secondary | ICD-10-CM | POA: Diagnosis not present

## 2015-11-12 DIAGNOSIS — I701 Atherosclerosis of renal artery: Secondary | ICD-10-CM | POA: Diagnosis not present

## 2015-11-12 DIAGNOSIS — I10 Essential (primary) hypertension: Secondary | ICD-10-CM | POA: Diagnosis not present

## 2015-11-12 DIAGNOSIS — N184 Chronic kidney disease, stage 4 (severe): Secondary | ICD-10-CM | POA: Diagnosis not present

## 2015-11-12 DIAGNOSIS — I150 Renovascular hypertension: Secondary | ICD-10-CM | POA: Diagnosis not present

## 2016-01-07 DIAGNOSIS — M25571 Pain in right ankle and joints of right foot: Secondary | ICD-10-CM | POA: Diagnosis not present

## 2016-01-07 DIAGNOSIS — M25561 Pain in right knee: Secondary | ICD-10-CM | POA: Diagnosis not present

## 2016-01-07 DIAGNOSIS — M25551 Pain in right hip: Secondary | ICD-10-CM | POA: Diagnosis not present

## 2016-01-07 DIAGNOSIS — S76011A Strain of muscle, fascia and tendon of right hip, initial encounter: Secondary | ICD-10-CM | POA: Diagnosis not present

## 2016-02-15 DIAGNOSIS — I6523 Occlusion and stenosis of bilateral carotid arteries: Secondary | ICD-10-CM | POA: Diagnosis not present

## 2016-02-15 DIAGNOSIS — I10 Essential (primary) hypertension: Secondary | ICD-10-CM | POA: Diagnosis not present

## 2016-02-15 DIAGNOSIS — I2510 Atherosclerotic heart disease of native coronary artery without angina pectoris: Secondary | ICD-10-CM | POA: Diagnosis not present

## 2016-02-24 DIAGNOSIS — N2581 Secondary hyperparathyroidism of renal origin: Secondary | ICD-10-CM | POA: Diagnosis not present

## 2016-02-24 DIAGNOSIS — I10 Essential (primary) hypertension: Secondary | ICD-10-CM | POA: Diagnosis not present

## 2016-02-24 DIAGNOSIS — R809 Proteinuria, unspecified: Secondary | ICD-10-CM | POA: Diagnosis not present

## 2016-02-24 DIAGNOSIS — E872 Acidosis: Secondary | ICD-10-CM | POA: Diagnosis not present

## 2016-02-24 DIAGNOSIS — N184 Chronic kidney disease, stage 4 (severe): Secondary | ICD-10-CM | POA: Diagnosis not present

## 2016-02-24 DIAGNOSIS — D631 Anemia in chronic kidney disease: Secondary | ICD-10-CM | POA: Diagnosis not present

## 2016-03-10 ENCOUNTER — Emergency Department: Payer: Medicare Other

## 2016-03-10 ENCOUNTER — Encounter: Payer: Self-pay | Admitting: Emergency Medicine

## 2016-03-10 ENCOUNTER — Observation Stay
Admission: EM | Admit: 2016-03-10 | Discharge: 2016-03-11 | Disposition: A | Discharge status: Home / Self Care | Payer: Medicare Other | Attending: Internal Medicine | Admitting: Internal Medicine

## 2016-03-10 DIAGNOSIS — N184 Chronic kidney disease, stage 4 (severe): Secondary | ICD-10-CM | POA: Insufficient documentation

## 2016-03-10 DIAGNOSIS — R471 Dysarthria and anarthria: Secondary | ICD-10-CM | POA: Diagnosis present

## 2016-03-10 DIAGNOSIS — I639 Cerebral infarction, unspecified: Secondary | ICD-10-CM | POA: Diagnosis not present

## 2016-03-10 DIAGNOSIS — G319 Degenerative disease of nervous system, unspecified: Secondary | ICD-10-CM | POA: Diagnosis not present

## 2016-03-10 DIAGNOSIS — E119 Type 2 diabetes mellitus without complications: Secondary | ICD-10-CM | POA: Diagnosis not present

## 2016-03-10 DIAGNOSIS — F0390 Unspecified dementia without behavioral disturbance: Secondary | ICD-10-CM | POA: Diagnosis not present

## 2016-03-10 DIAGNOSIS — E039 Hypothyroidism, unspecified: Secondary | ICD-10-CM | POA: Diagnosis not present

## 2016-03-10 DIAGNOSIS — E1122 Type 2 diabetes mellitus with diabetic chronic kidney disease: Secondary | ICD-10-CM | POA: Diagnosis not present

## 2016-03-10 DIAGNOSIS — D649 Anemia, unspecified: Secondary | ICD-10-CM | POA: Insufficient documentation

## 2016-03-10 DIAGNOSIS — Z833 Family history of diabetes mellitus: Secondary | ICD-10-CM | POA: Diagnosis not present

## 2016-03-10 DIAGNOSIS — Z9889 Other specified postprocedural states: Secondary | ICD-10-CM | POA: Insufficient documentation

## 2016-03-10 DIAGNOSIS — Z955 Presence of coronary angioplasty implant and graft: Secondary | ICD-10-CM | POA: Diagnosis not present

## 2016-03-10 DIAGNOSIS — R410 Disorientation, unspecified: Secondary | ICD-10-CM | POA: Diagnosis not present

## 2016-03-10 DIAGNOSIS — R0789 Other chest pain: Secondary | ICD-10-CM | POA: Diagnosis not present

## 2016-03-10 DIAGNOSIS — Z853 Personal history of malignant neoplasm of breast: Secondary | ICD-10-CM | POA: Insufficient documentation

## 2016-03-10 DIAGNOSIS — G459 Transient cerebral ischemic attack, unspecified: Principal | ICD-10-CM | POA: Insufficient documentation

## 2016-03-10 DIAGNOSIS — Z8042 Family history of malignant neoplasm of prostate: Secondary | ICD-10-CM | POA: Insufficient documentation

## 2016-03-10 DIAGNOSIS — I6523 Occlusion and stenosis of bilateral carotid arteries: Secondary | ICD-10-CM | POA: Insufficient documentation

## 2016-03-10 DIAGNOSIS — I10 Essential (primary) hypertension: Secondary | ICD-10-CM | POA: Diagnosis not present

## 2016-03-10 DIAGNOSIS — R262 Difficulty in walking, not elsewhere classified: Secondary | ICD-10-CM | POA: Insufficient documentation

## 2016-03-10 DIAGNOSIS — R4701 Aphasia: Secondary | ICD-10-CM | POA: Diagnosis not present

## 2016-03-10 DIAGNOSIS — R079 Chest pain, unspecified: Secondary | ICD-10-CM | POA: Diagnosis present

## 2016-03-10 DIAGNOSIS — I129 Hypertensive chronic kidney disease with stage 1 through stage 4 chronic kidney disease, or unspecified chronic kidney disease: Secondary | ICD-10-CM | POA: Diagnosis not present

## 2016-03-10 DIAGNOSIS — Z882 Allergy status to sulfonamides status: Secondary | ICD-10-CM | POA: Diagnosis not present

## 2016-03-10 DIAGNOSIS — Z87891 Personal history of nicotine dependence: Secondary | ICD-10-CM | POA: Diagnosis not present

## 2016-03-10 DIAGNOSIS — Z8249 Family history of ischemic heart disease and other diseases of the circulatory system: Secondary | ICD-10-CM | POA: Insufficient documentation

## 2016-03-10 DIAGNOSIS — I2510 Atherosclerotic heart disease of native coronary artery without angina pectoris: Secondary | ICD-10-CM | POA: Insufficient documentation

## 2016-03-10 HISTORY — DX: Type 2 diabetes mellitus without complications: E11.9

## 2016-03-10 HISTORY — DX: Unspecified dementia, unspecified severity, without behavioral disturbance, psychotic disturbance, mood disturbance, and anxiety: F03.90

## 2016-03-10 HISTORY — DX: Disorder of kidney and ureter, unspecified: N28.9

## 2016-03-10 LAB — CBC
HEMATOCRIT: 36.2 % (ref 35.0–47.0)
Hemoglobin: 12 g/dL (ref 12.0–16.0)
MCH: 30.3 pg (ref 26.0–34.0)
MCHC: 33.1 g/dL (ref 32.0–36.0)
MCV: 91.4 fL (ref 80.0–100.0)
Platelets: 224 10*3/uL (ref 150–440)
RBC: 3.96 MIL/uL (ref 3.80–5.20)
RDW: 14.9 % — AB (ref 11.5–14.5)
WBC: 9.6 10*3/uL (ref 3.6–11.0)

## 2016-03-10 LAB — DIFFERENTIAL
BASOS ABS: 0.1 10*3/uL (ref 0–0.1)
BASOS PCT: 1 %
EOS ABS: 0.1 10*3/uL (ref 0–0.7)
Eosinophils Relative: 1 %
Lymphocytes Relative: 33 %
Lymphs Abs: 3.2 10*3/uL (ref 1.0–3.6)
MONO ABS: 0.8 10*3/uL (ref 0.2–0.9)
MONOS PCT: 8 %
Neutro Abs: 5.4 10*3/uL (ref 1.4–6.5)
Neutrophils Relative %: 57 %

## 2016-03-10 LAB — COMPREHENSIVE METABOLIC PANEL
ALT: 14 U/L (ref 14–54)
ANION GAP: 9 (ref 5–15)
AST: 18 U/L (ref 15–41)
Albumin: 3.8 g/dL (ref 3.5–5.0)
Alkaline Phosphatase: 81 U/L (ref 38–126)
BUN: 24 mg/dL — ABNORMAL HIGH (ref 6–20)
CHLORIDE: 103 mmol/L (ref 101–111)
CO2: 26 mmol/L (ref 22–32)
Calcium: 9.3 mg/dL (ref 8.9–10.3)
Creatinine, Ser: 1.93 mg/dL — ABNORMAL HIGH (ref 0.44–1.00)
GFR, EST AFRICAN AMERICAN: 25 mL/min — AB (ref >60)
GFR, EST NON AFRICAN AMERICAN: 22 mL/min — AB (ref >60)
Glucose, Bld: 103 mg/dL — ABNORMAL HIGH (ref 65–99)
POTASSIUM: 4.2 mmol/L (ref 3.5–5.1)
Sodium: 138 mmol/L (ref 135–145)
Total Bilirubin: 0.4 mg/dL (ref 0.3–1.2)
Total Protein: 7.3 g/dL (ref 6.5–8.1)

## 2016-03-10 LAB — TROPONIN I: Troponin I: <0.03 ng/mL (ref <0.03)

## 2016-03-10 LAB — APTT: APTT: 33 s (ref 24–36)

## 2016-03-10 LAB — ETHANOL

## 2016-03-10 LAB — PROTIME-INR
INR: 0.94
Prothrombin Time: 12.6 seconds (ref 11.4–15.2)

## 2016-03-10 LAB — GLUCOSE, CAPILLARY: GLUCOSE-CAPILLARY: 96 mg/dL (ref 65–99)

## 2016-03-10 MED ORDER — SODIUM BICARBONATE 650 MG PO TABS
650.0000 mg | ORAL_TABLET | Freq: Two times a day (BID) | ORAL | Status: DC
  Administered 2016-03-10 – 2016-03-11 (×2): 650 mg via ORAL
  Filled 2016-03-10 (×2): qty 1

## 2016-03-10 MED ORDER — ACETAMINOPHEN 650 MG RE SUPP
650.0000 mg | Freq: Four times a day (QID) | RECTAL | Status: DC | PRN
Start: 2016-03-10 — End: 2016-03-11

## 2016-03-10 MED ORDER — LEVOTHYROXINE SODIUM 125 MCG PO TABS
125.0000 ug | ORAL_TABLET | Freq: Every day | ORAL | Status: DC
  Administered 2016-03-11: 125 ug via ORAL
  Filled 2016-03-10: qty 2.5
  Filled 2016-03-10: qty 1

## 2016-03-10 MED ORDER — ASPIRIN 81 MG PO CHEW
81.0000 mg | CHEWABLE_TABLET | Freq: Once | ORAL | Status: AC
  Administered 2016-03-10: 81 mg via ORAL
  Filled 2016-03-10: qty 1

## 2016-03-10 MED ORDER — LATANOPROST 0.005 % OP SOLN
1.0000 [drp] | Freq: Every day | OPHTHALMIC | Status: DC
  Administered 2016-03-10: 1 [drp] via OPHTHALMIC
  Filled 2016-03-10: qty 2.5

## 2016-03-10 MED ORDER — MEMANTINE HCL 5 MG PO TABS
5.0000 mg | ORAL_TABLET | Freq: Two times a day (BID) | ORAL | Status: DC
  Administered 2016-03-10 – 2016-03-11 (×2): 5 mg via ORAL
  Filled 2016-03-10 (×2): qty 1

## 2016-03-10 MED ORDER — ENOXAPARIN SODIUM 30 MG/0.3ML ~~LOC~~ SOLN
30.0000 mg | SUBCUTANEOUS | Status: DC
  Administered 2016-03-10: 30 mg via SUBCUTANEOUS
  Filled 2016-03-10: qty 0.3

## 2016-03-10 MED ORDER — STROKE: EARLY STAGES OF RECOVERY BOOK
Freq: Once | Status: AC
  Administered 2016-03-10: 19:00:00

## 2016-03-10 MED ORDER — PANTOPRAZOLE SODIUM 40 MG PO TBEC
40.0000 mg | DELAYED_RELEASE_TABLET | Freq: Every day | ORAL | Status: DC
  Administered 2016-03-11: 40 mg via ORAL
  Filled 2016-03-10: qty 1

## 2016-03-10 MED ORDER — ASPIRIN EC 81 MG PO TBEC
81.0000 mg | DELAYED_RELEASE_TABLET | Freq: Every day | ORAL | Status: DC
  Administered 2016-03-11: 81 mg via ORAL
  Filled 2016-03-10: qty 1

## 2016-03-10 MED ORDER — NORTRIPTYLINE HCL 10 MG PO CAPS
10.0000 mg | ORAL_CAPSULE | Freq: Every day | ORAL | Status: DC
  Administered 2016-03-10 – 2016-03-11 (×2): 10 mg via ORAL
  Filled 2016-03-10 (×2): qty 1

## 2016-03-10 MED ORDER — FERROUS FUMARATE 324 (106 FE) MG PO TABS
1.0000 | ORAL_TABLET | Freq: Two times a day (BID) | ORAL | Status: DC
  Administered 2016-03-11: 106 mg via ORAL
  Filled 2016-03-10 (×2): qty 1

## 2016-03-10 MED ORDER — FERROUS FUMARATE 50 MG PO TBCR: 50.0000 mg | EXTENDED_RELEASE_TABLET | Freq: Three times a day (TID) | ORAL | Status: DC

## 2016-03-10 MED ORDER — ACETAMINOPHEN 325 MG PO TABS: 650.0000 mg | ORAL_TABLET | Freq: Four times a day (QID) | ORAL | Status: DC | PRN

## 2016-03-10 NOTE — ED Triage Notes (Signed)
Pt arrived to ED in POV with daughter with c/o of slurred speech that started after she experienced chest pain while shopping with her daughter. Stroke team at bedside with MD [place].

## 2016-03-10 NOTE — H&P (Signed)
[place] [facility] [place] NAME: Mariah Watkins    MR#:  [id number]  DATE OF BIRTH:  25-Mar-1923  DATE OF ADMISSION:  03/10/2016  PRIMARY CARE PHYSICIAN: Sherrin Daisy, MD   REQUESTING/REFERRING PHYSICIAN: Dr. Lisa Roca  CHIEF COMPLAINT:   Chief Complaint  Patient presents with  . Code Stroke    HISTORY OF PRESENT ILLNESS:  Mariah Watkins  is a 82 y.o. female presents to the ER with an episode of unable to speak lasting for at least 30 minutes. They were on their way to get something to eat when she complained of chest pain. She ended up taking 3 nitroglycerin and her chest pain did get a little bit better. But she didn't feel well after that and wanted to go home and then couldn't talk. On the way from med bend over to the ER she got worse and couldn't follow commands. Normally she does walk with a walker. As per the family she did have previous chest pains in the past.  PAST MEDICAL HISTORY:   Past Medical History:  Diagnosis Date  . Cancer of breast, female ([place]) 01/01/2015  . Dementia   . Diabetes mellitus without complication ([place])   . Renal disorder     PAST SURGICAL HISTORY:   Past Surgical History:  Procedure Laterality Date  . BOWEL RESECTION    . BREAST SURGERY    . cataracts    . retinal surgey    . SKIN BIOPSY      SOCIAL HISTORY:   Social History  Substance Use Topics  . Smoking status: Former Research scientist (life sciences)  . Smokeless tobacco: Never Used  . Alcohol use No    FAMILY HISTORY:   Family History  Problem Relation Age of Onset  . Diabetes Mother   . Hypertension Mother   . Prostate cancer Father     DRUG ALLERGIES:   Allergies  Allergen Reactions  . Sulfa Antibiotics Nausea And Vomiting    REVIEW OF SYSTEMS:  CONSTITUTIONAL: No fever, fatigue or weakness.  EYES: No blurred or double vision. History of macular degeneration. EARS, NOSE, AND THROAT: No tinnitus or ear pain. No sore throat. Decreased  hearing. Positive for runny nose. RESPIRATORY: No cough, shortness of breath, wheezing or hemoptysis.  CARDIOVASCULAR: Positive for chest pain. No orthopnea, edema.  GASTROINTESTINAL: No nausea, vomiting, diarrhea or abdominal pain. No blood in bowel movements. Positive for constipation GENITOURINARY: No dysuria, hematuria.  ENDOCRINE: No polyuria, nocturia,  HEMATOLOGY: No anemia, easy bruising or bleeding SKIN: No rash or lesion. MUSCULOSKELETAL: Positive for joint pain NEUROLOGIC: No tingling, numbness, weakness.  PSYCHIATRY: Positive for anxiety  MEDICATIONS AT HOME:   Prior to Admission medications   Medication Sig Start Date End Date Taking? Authorizing Provider  Acetaminophen 500 MG coapsule Take 1 capsule by mouth daily as needed.  03/25/14  Yes Historical Provider, MD  Cyanocobalamin (RA VITAMIN B-12 TR) 1000 MCG TBCR Take by mouth.   Yes Historical Provider, MD  ferrous fumarate (FERRO-SEQUELS) 50 MG CR tablet Take 50 mg by mouth 3 (three) times daily with meals.   Yes Historical Provider, MD  latanoprost (XALATAN) 0.005 % ophthalmic solution Place 1 drop into both eyes at bedtime.   Yes Historical Provider, MD  levothyroxine (SYNTHROID, LEVOTHROID) 125 MCG tablet Take 125 mcg by mouth daily before breakfast.   Yes Historical Provider, MD  memantine (NAMENDA) 5 MG tablet Take by mouth. 07/29/15 07/28/16 Yes Historical Provider, MD  nortriptyline (PAMELOR) 10 MG  capsule Take 10 mg by mouth daily.  10/29/14  Yes Historical Provider, MD  omeprazole (PRILOSEC) 20 MG capsule Take by mouth. 12/07/14  Yes Historical Provider, MD  sodium bicarbonate 650 MG tablet TAKE 1 TABLET (650 MG TOTAL) BY MOUTH 3 (THREE) TIMES DAILY. 07/27/14  Yes Historical Provider, MD  sodium bicarbonate 650 MG tablet Take 650 mg by mouth 2 (two) times daily.   Yes Historical Provider, MD  nitroGLYCERIN (NITROSTAT) 0.4 MG SL tablet Place 0.4 mg under the tongue every 5 (five) minutes as needed.  03/25/14   Historical  Provider, MD      VITAL SIGNS:  Blood pressure (!) 160/94, pulse 90, temperature 97.7 F (36.5 C), temperature source Oral, resp. rate 13, height 5\' 5"  (1.651 m), weight 65.5 kg (144 lb 6.4 oz), SpO2 99 %.  PHYSICAL EXAMINATION:  GENERAL:  82 y.o.-year-old patient lying in the bed with no acute distress.  EYES: Pupils equal, round, reactive to light and accommodation. No scleral icterus. Extraocular muscles intact.  HEENT: Head atraumatic, normocephalic. Oropharynx and nasopharynx clear.  NECK:  Supple, no jugular venous distention. No thyroid enlargement, no tenderness.  LUNGS: Normal breath sounds bilaterally, no wheezing, rales,rhonchi or crepitation. No use of accessory muscles of respiration.  CARDIOVASCULAR: S1, S2 normal. No murmurs, rubs, or gallops.  ABDOMEN: Soft, nontender, nondistended. Bowel sounds present. No organomegaly or mass.  EXTREMITIES: No pedal edema, cyanosis, or clubbing.  NEUROLOGIC: Cranial nerves II through XII are intact. Muscle strength 5/5 in all extremities. Sensation intact. Gait not checked.  PSYCHIATRIC: The patient is alert and answers questions appropriately.  SKIN: No rash, lesion, or ulcer.   LABORATORY PANEL:   CBC  Recent Labs Lab 03/10/16 1450  WBC 9.6  HGB 12.0  HCT 36.2  PLT 224   ------------------------------------------------------------------------------------------------------------------  Chemistries   Recent Labs Lab 03/10/16 1450  NA 138  K 4.2  CL 103  CO2 26  GLUCOSE 103*  BUN 24*  CREATININE 1.93*  CALCIUM 9.3  AST 18  ALT 14  ALKPHOS 81  BILITOT 0.4   ------------------------------------------------------------------------------------------------------------------  Cardiac Enzymes  Recent Labs Lab 03/10/16 1450  TROPONINI <0.03   ------------------------------------------------------------------------------------------------------------------  RADIOLOGY:  Ct Head Wo Contrast  Result Date:  03/10/2016 CLINICAL DATA:  Acute onset of slurred speech and confusion. Blurred vision. EXAM: CT HEAD WITHOUT CONTRAST TECHNIQUE: Contiguous axial images were obtained from the base of the skull through the vertex without intravenous contrast. COMPARISON:  08/30/2014.  MRI 09/01/2014. FINDINGS: Brain: Generalized brain atrophy. Chronic small-vessel ischemic changes throughout the deep and subcortical white matter. No sign of acute infarction, mass lesion, hemorrhage, hydrocephalus or extra-axial collection. Vascular: There is atherosclerotic calcification of the major vessels at the base of the brain. Skull: Negative Sinuses/Orbits: Clear/normal Other: None significant IMPRESSION: No acute finding by CT. Atrophy and chronic small-vessel ischemic changes. These results were called by telephone at the time of interpretation on 03/10/2016 at 2:44 pm to Dr. Lavonia Drafts , who verbally acknowledged these results. Electronically Signed   By: Nelson Chimes M.D.   On: 03/10/2016 14:46     IMPRESSION AND PLAN:   1. Dysarthria and suspected TIA. Get an MRI of the brain, carotid ultrasound and echocardiogram. Physical therapy evaluation. Speech is back to normal at this time. Aspirin 81 mg daily. 2. Chest pain likely from scar tissue from previous surgery. Check cardiac enzymes. Echocardiogram ordered. 3. History of dementia on Namenda 4. Chronic kidney disease stage IV 5. History of breast cancer 6. Hypothyroidism  continue Synthroid 7. Type 2 diabetes diet control only  All the records are reviewed and case discussed with ED provider. Management plans discussed with the patient, family and they are in agreement.  CODE STATUS: Full code  TOTAL TIME TAKING CARE OF THIS PATIENT: 50 minutes.    Loletha Grayer M.D on 03/10/2016 at 4:45 PM  Between 7am to 6pm - Pager - [phone]  After 6pm call admission pager [phone]  [facility]  [phone]  CC: Primary care physician; Sherrin Daisy, MD

## 2016-03-10 NOTE — ED Notes (Signed)
Assisted patient to restroom.

## 2016-03-10 NOTE — ED Provider Notes (Signed)
[facility] Emergency Department Provider Note ____________________________________________   I have reviewed the triage vital signs and the triage nursing note.  HISTORY  Chief Complaint Code Stroke   Historian Patient and daughter who was with her  HPI Mariah Watkins is a 82 y.o. female with a history of diabetes, hypertension, but does not take medications for these right now, as well as a history of GI bleeding in the past, several years ago, at which point she was taken off aspirin, and a history of coronary artery disease with a stent, for whom she follows with Dr. Nehemiah Massed, is here for episode of chest pain and then aphasia.  She lives with her daughter for about one year now. She has some mild dementia. She has not had chest discomfort in about a year.  Today they were preparing to go shopping, and when the daughter came back down to the car this patient was having chest discomfort. The daughter gave her 3 sublingual nitros every 5 minutes. After the third sublingual nitroglycerin the patient started slurring her speech and not being able to get her words out. The daughter decided to start driving her to the hospital, and at times the patient said words that did not make sense. When she was in triage it sounds like she was having trouble understanding directions. She was made a code stroke.  Daughter is not exactly sure about timing, but thinks that was about 1 hour prior to actual arrival.    Past Medical History:  Diagnosis Date  . Cancer of breast, female ([place]) 01/01/2015  . Diabetes mellitus without complication ([place])   . Renal disorder     Patient Active Problem List   Diagnosis Date Noted  . Cancer of breast, female ([place]) 01/01/2015  . Adenomatous polyp 01/01/2015  . Gout 01/01/2015  . Benign essential HTN 12/04/2014  . Carotid artery narrowing 06/17/2014  . Controlled diabetes mellitus type II without complication ([place]) [id number]  .  Arteriovenous malformation of gastrointestinal tract 03/25/2014  . Arteriosclerosis of coronary artery 02/18/2014    Past Surgical History:  Procedure Laterality Date  . BOWEL RESECTION    . BREAST SURGERY    . SKIN BIOPSY      Prior to Admission medications   Medication Sig Start Date End Date Taking? Authorizing Provider  Acetaminophen 500 MG coapsule Take 1 capsule by mouth daily as needed.  03/25/14  Yes Historical Provider, MD  Cyanocobalamin (RA VITAMIN B-12 TR) 1000 MCG TBCR Take by mouth.   Yes Historical Provider, MD  ferrous fumarate (FERRO-SEQUELS) 50 MG CR tablet Take 50 mg by mouth 3 (three) times daily with meals.   Yes Historical Provider, MD  latanoprost (XALATAN) 0.005 % ophthalmic solution Place 1 drop into both eyes at bedtime.   Yes Historical Provider, MD  levothyroxine (SYNTHROID, LEVOTHROID) 125 MCG tablet Take 125 mcg by mouth daily before breakfast.   Yes Historical Provider, MD  memantine (NAMENDA) 5 MG tablet Take by mouth. 07/29/15 07/28/16 Yes Historical Provider, MD  nortriptyline (PAMELOR) 10 MG capsule Take 10 mg by mouth daily.  10/29/14  Yes Historical Provider, MD  omeprazole (PRILOSEC) 20 MG capsule Take by mouth. 12/07/14  Yes Historical Provider, MD  sodium bicarbonate 650 MG tablet TAKE 1 TABLET (650 MG TOTAL) BY MOUTH 3 (THREE) TIMES DAILY. 07/27/14  Yes Historical Provider, MD  sodium bicarbonate 650 MG tablet Take 650 mg by mouth 2 (two) times daily.   Yes Historical Provider, MD  nitroGLYCERIN (  NITROSTAT) 0.4 MG SL tablet Place 0.4 mg under the tongue every 5 (five) minutes as needed.  03/25/14   Historical Provider, MD    Allergies  Allergen Reactions  . Sulfa Antibiotics Nausea And Vomiting    History reviewed. No pertinent family history.  Social History Social History  Substance Use Topics  . Smoking status: Former Research scientist (life sciences)  . Smokeless tobacco: Never Used  . Alcohol use No    Review of Systems  Constitutional: Negative for fever. Eyes:  Negative for visual changes. ENT: Negative for sore throat. Cardiovascular: Positive for chest pain. Central chest discomfort relieved by nitroglycerin. No nausea or sweating or shortness of breath. Respiratory: Negative for trouble breathing. Gastrointestinal: Negative for abdominal pain, vomiting and diarrhea. Genitourinary: Negative for dysuria. Musculoskeletal: Negative for back pain. Skin: Negative for rash. Neurological: Negative for headache. 10 point Review of Systems otherwise negative ____________________________________________   PHYSICAL EXAM:  VITAL SIGNS: ED Triage Vitals  Enc Vitals Group     BP 03/10/16 1450 (!) 145/103     Pulse Rate 03/10/16 1450 97     Resp 03/10/16 1450 16     Temp 03/10/16 1506 97.7 F (36.5 C)     Temp Source 03/10/16 1506 Oral     SpO2 03/10/16 1450 100 %     Weight 03/10/16 1451 144 lb 6.4 oz (65.5 kg)     Height 03/10/16 1451 5\' 5"  (1.651 m)     Head Circumference --      Peak Flow --      Pain Score --      Pain Loc --      Pain Edu? --      Excl. in [place]? --      Constitutional: Alert and oriented. Well appearing and in no distress. HEENT   Head: Normocephalic and atraumatic.      Eyes: Conjunctivae are normal. PERRL. Normal extraocular movements.      Ears:         Nose: No congestion/rhinnorhea.   Mouth/Throat: Mucous membranes are moist.   Neck: No stridor. Cardiovascular/Chest: Normal rate, regular rhythm.  No murmurs, rubs, or gallops. Bilateral mastectomies. Respiratory: Normal respiratory effort without tachypnea nor retractions. Breath sounds are clear and equal bilaterally. No wheezes/rales/rhonchi. Gastrointestinal: Soft. No distention, no guarding, no rebound. Nontender.    Genitourinary/rectal:Deferred Musculoskeletal: Nontender with normal range of motion in all extremities. No joint effusions.  No lower extremity tenderness.  No edema. Neurologic:  Normal speech and language. No gross or focal neurologic  deficits are appreciated. Skin:  Skin is warm, dry and intact. No rash noted. Psychiatric: Mood and affect are normal. Speech and behavior are normal. Patient exhibits appropriate insight and judgment.   ____________________________________________  LABS (pertinent positives/negatives)  Labs Reviewed  CBC - Abnormal; Notable for the following:       Result Value   RDW 14.9 (*)    All other components within normal limits  COMPREHENSIVE METABOLIC PANEL - Abnormal; Notable for the following:    Glucose, Bld 103 (*)    BUN 24 (*)    Creatinine, Ser 1.93 (*)    GFR calc non Af Amer 22 (*)    GFR calc Af Amer 25 (*)    All other components within normal limits  ETHANOL  PROTIME-INR  APTT  DIFFERENTIAL  GLUCOSE, CAPILLARY  URINE RAPID DRUG SCREEN, [place], ED    ____________________________________________    EKG I, Lisa Roca, MD, the attending physician have  personally viewed and interpreted all ECGs.  90 bpm. Normal sinus rhythm. Incomplete right bundle branch block. Nonspecific T-wave with T-wave inversions anteriorly. ____________________________________________  RADIOLOGY All Xrays were viewed by me. Imaging interpreted by Radiologist.  CT scan without contrast: No acute finding by CT. Atrophy and chronic small vessel ischemic changes. __________________________________________  PROCEDURES  Procedure(s) performed: None  Critical Care performed: CRITICAL CARE Performed by: Lisa Roca   Total critical care time: 30 minutes  Critical care time was exclusive of separately billable procedures and treating other patients.  Critical care was necessary to treat or prevent imminent or life-threatening deterioration.  Critical care was time spent personally by me on the following activities: development of treatment plan with patient and/or surrogate as well as nursing, discussions with consultants, evaluation of patient's response to  treatment, examination of patient, obtaining history from patient or surrogate, ordering and performing treatments and interventions, ordering and review of laboratory studies, ordering and review of radiographic studies, pulse oximetry and re-evaluation of patient's condition.   ____________________________________________   ED COURSE / ASSESSMENT AND PLAN  Pertinent labs & imaging results that were available during my care of the patient were reviewed by me and considered in my medical decision making (see chart for details).    Ms. Nanney was made a code stroke by triage based on symptoms of expressive aphasia and continued problems with receptive aphasia.  Dr. Burlene Arnt initially saw the patient who was stable in terms of cardiopulmonary presentation.  Dr. Doy Mince (neurology) was at bedside when I arrive for shift at 3pm.  By 3:30 symptoms have really essentially resolved. My impression would not be to initiate TPA, and consulting neurologist did not recommend TPA. CT head was negative for acute findings.  In any case, patient will be evaluated for further workup and management both of the initial chest discomfort episode, as well as the symptoms concerning for TIA.  As far as anticoagulation, she does have coronary artery disease and previously was on aspirin, was taken off this due to risk versus benefit after a GI bleed several years ago. I discussed with patient and family to go ahead and take aspirin today, she will be monitored here in the hospital, and they can discuss tomorrow ongoing plan for continuation of adding a blood thinner back or not based on risk versus benefit.  In terms of the chest discomfort, no associated symptoms, and EKG is similar to prior.  I reviewed the protocol orders placed under my name, and found that in i-STAT troponin was ordered, and this is not available here. I changed the order to troponin and added on to the lab bloodwork.  The troponin is pending at  time of hospitalist consultation.     CONSULTATIONS: Face-to-face with neurology, Dr. Doy Mince at the bedside who recommends against TPA, recommends hospital admission for workup.  Face-to-face with Hospitalist for admission.   Patient / Family / Caregiver informed of clinical course, medical decision-making process, and agree with plan.   ___________________________________________   FINAL CLINICAL IMPRESSION(S) / ED DIAGNOSES   Final diagnoses:  Transient cerebral ischemia, unspecified transient cerebral ischemia type  Chest pain, unspecified chest pain type              Note: This dictation was prepared with Dragon dictation. Any transcriptional errors that result from this process are unintentional    Lisa Roca, MD 03/10/16 [id number]

## 2016-03-10 NOTE — Progress Notes (Signed)
Patient admitted to unit. Oriented to room, call bell, and staff. Bed in lowest position. Fall safety plan reviewed. Full assessment to Epic. Skin assessment verified with  Maddie Himes RN. Telemetry box verification with tele clerk and [place] NT- Box#: --[id number]. Will continue to monitor.

## 2016-03-10 NOTE — Consult Note (Signed)
Referring Physician: Reita Cliche    Chief Complaint: Blurred vision, difficulty with speech  HPI: Mariah Watkins is an 82 y.o. female who was out with her daughter today shopping.  Daughter went into the store and when she came out patient was complaining of chest pain.  Over 15 minutes she recieved 3 NTG.  Then began to have difficulty getting her words out and complained that she could not see.  Patient was brought to the ED at that time.  Initial NIHSS of 4.  Date last known well: Date: 03/10/2016 Time last known well: Time: 14:00 tPA Given: No: Resolving symptoms  Past Medical History:  Diagnosis Date  . Cancer of breast, female ([place]) 01/01/2015  . Diabetes mellitus without complication ([place])   . Renal disorder     Past Surgical History:  Procedure Laterality Date  . BOWEL RESECTION    . BREAST SURGERY    . SKIN BIOPSY      Family history:  Mother with DM, CAD, HTN-now deceased.  Father with RA and cancer-now deceased.  Sister deceased with stomach cancer.  Brother deceased from lung cancer.   Social History:  reports that she has quit smoking. She has never used smokeless tobacco. She reports that she does not drink alcohol or use drugs.  Allergies:  Allergies  Allergen Reactions  . Sulfa Antibiotics Nausea And Vomiting    Medications: I have reviewed the patient's current medications. Prior to Admission:  Prior to Admission medications   Medication Sig Start Date End Date Taking? Authorizing Provider  Acetaminophen 500 MG coapsule Take 1 capsule by mouth daily as needed.  03/25/14  Yes Historical Provider, MD  Cyanocobalamin (RA VITAMIN B-12 TR) 1000 MCG TBCR Take by mouth.   Yes Historical Provider, MD  ferrous fumarate (FERRO-SEQUELS) 50 MG CR tablet Take 50 mg by mouth 3 (three) times daily with meals.   Yes Historical Provider, MD  latanoprost (XALATAN) 0.005 % ophthalmic solution Place 1 drop into both eyes at bedtime.   Yes Historical Provider, MD  levothyroxine  (SYNTHROID, LEVOTHROID) 125 MCG tablet Take 125 mcg by mouth daily before breakfast.   Yes Historical Provider, MD  memantine (NAMENDA) 5 MG tablet Take by mouth. 07/29/15 07/28/16 Yes Historical Provider, MD  nortriptyline (PAMELOR) 10 MG capsule Take 10 mg by mouth daily.  10/29/14  Yes Historical Provider, MD  omeprazole (PRILOSEC) 20 MG capsule Take by mouth. 12/07/14  Yes Historical Provider, MD  sodium bicarbonate 650 MG tablet TAKE 1 TABLET (650 MG TOTAL) BY MOUTH 3 (THREE) TIMES DAILY. 07/27/14  Yes Historical Provider, MD  sodium bicarbonate 650 MG tablet Take 650 mg by mouth 2 (two) times daily.   Yes Historical Provider, MD  nitroGLYCERIN (NITROSTAT) 0.4 MG SL tablet Place 0.4 mg under the tongue every 5 (five) minutes as needed.  03/25/14   Historical Provider, MD    ROS: History obtained from the patient  General ROS: negative for - chills, fatigue, fever, night sweats, weight gain or weight loss Psychological ROS: negative for - behavioral disorder, hallucinations, memory difficulties, mood swings or suicidal ideation Ophthalmic ROS: as noted in HPI ENT ROS: negative for - epistaxis, nasal discharge, oral lesions, sore throat, tinnitus or vertigo Allergy and Immunology ROS: negative for - hives or itchy/watery eyes Hematological and Lymphatic ROS: negative for - bleeding problems, bruising or swollen lymph nodes Endocrine ROS: negative for - galactorrhea, hair pattern changes, polydipsia/polyuria or temperature intolerance Respiratory ROS: negative for - cough, hemoptysis, shortness of breath  or wheezing Cardiovascular ROS: as noted in HPI Gastrointestinal ROS: negative for - abdominal pain, diarrhea, hematemesis, nausea/vomiting or stool incontinence Genito-Urinary ROS: negative for - dysuria, hematuria, incontinence or urinary frequency/urgency Musculoskeletal ROS: negative for - joint swelling or muscular weakness Neurological ROS: as noted in HPI Dermatological ROS: negative for  rash and skin lesion changes  Physical Examination: Blood pressure (!) 160/94, pulse 90, temperature 97.7 F (36.5 C), temperature source Oral, resp. rate 13, height 5\' 5"  (1.651 m), weight 65.5 kg (144 lb 6.4 oz), SpO2 99 %.  HEENT-  Normocephalic, no lesions, without obvious abnormality.  Normal external eye and conjunctiva.  Normal TM's bilaterally.  Normal auditory canals and external ears. Normal external nose, mucus membranes and septum.  Normal pharynx. Cardiovascular- S1, S2 normal, pulses palpable throughout   Lungs- chest clear, no wheezing, rales, normal symmetric air entry Abdomen- soft, non-tender; bowel sounds normal; no masses,  no organomegaly Extremities- no edema Lymph-no adenopathy palpable Musculoskeletal-no joint tenderness, deformity or swelling Skin-warm and dry, no hyperpigmentation, vitiligo, or suspicious lesions  Neurological Examination Mental Status: Alert, unable to give date, thought content appropriate.  Word finding difficulties with some stuttering noted as well.  Able to follow simple commands without difficulty.  Needs significant reinforcement for 3-step commands.   Cranial Nerves: II: Discs flat bilaterally; Visual fields grossly normal, pupils equal, round, reactive to light and accommodation III,IV, VI: ptosis not present, extra-ocular motions intact bilaterally V,VII: smile symmetric, facial light touch sensation normal bilaterally VIII: hearing normal bilaterally IX,X: gag reflex present XI: bilateral shoulder shrug XII: midline tongue extension Motor: Right : Upper extremity   5/5    Left:     Upper extremity   5/5  Lower extremity   5-/5     Lower extremity   5-/5 Tone and bulk:normal tone throughout; no atrophy noted Sensory: Pinprick and light touch intact throughout, bilaterally Deep Tendon Reflexes: 2+ in the upper extremities and absent in the lower extremities Plantars: Right: downgoing   Left: downgoing Cerebellar: Normal  finger-to-nose and normal heel-to-shin testing bilaterally Gait: not tested due to safety concerns   Laboratory Studies:  Basic Metabolic Panel:  Recent Labs Lab 03/10/16 1450  NA 138  K 4.2  CL 103  CO2 26  GLUCOSE 103*  BUN 24*  CREATININE 1.93*  CALCIUM 9.3    Liver Function Tests:  Recent Labs Lab 03/10/16 1450  AST 18  ALT 14  ALKPHOS 81  BILITOT 0.4  PROT 7.3  ALBUMIN 3.8   No results for input(s): LIPASE, AMYLASE in the last 168 hours. No results for input(s): AMMONIA in the last 168 hours.  CBC:  Recent Labs Lab 03/10/16 1450  WBC 9.6  NEUTROABS 5.4  HGB 12.0  HCT 36.2  MCV 91.4  PLT 224    Cardiac Enzymes: No results for input(s): CKTOTAL, CKMB, CKMBINDEX, TROPONINI in the last 168 hours.  BNP: Invalid input(s): POCBNP  CBG:  Recent Labs Lab 03/10/16 1450  GLUCAP 96    Microbiology: Results for orders placed or performed in visit on 05/02/14  Urine culture     Status: None   Collection Time: 05/02/14  1:18 PM  Result Value Ref Range Status   Micro Text Report   Final       SOURCE: CLEAN CATCH    ORGANISM 1                >100,000 CFU/ML ESCHERICHIA COLI   COMMENT  WITH MIXED-BACTERIAL-ORGANISMS   COMMENT                   -   ANTIBIOTIC                    ORG#1     AMPICILLIN                    S         CEFAZOLIN                     S         CEFOXITIN                     S         CEFTAZIDIME                   S         CEFTRIAXONE                   S         CIPROFLOXACIN                 S         GENTAMICIN                    S         IMIPENEM                      S         LEVOFLOXACIN                  S         TRIMETH/SULFA                 S             Coagulation Studies:  Recent Labs  03/10/16 1450  LABPROT 12.6  INR 0.94    Urinalysis: No results for input(s): COLORURINE, LABSPEC, PHURINE, GLUCOSEU, HGBUR, BILIRUBINUR, KETONESUR, PROTEINUR, UROBILINOGEN, NITRITE, LEUKOCYTESUR in the  last 168 hours.  Invalid input(s): APPERANCEUR  Lipid Panel:    Component Value Date/Time   CHOL 155 01/12/2013 0330   TRIG 116 01/12/2013 0330   HDL 44 01/12/2013 0330   VLDL 23 01/12/2013 0330   LDLCALC 88 01/12/2013 0330    HgbA1C:  Lab Results  Component Value Date   HGBA1C 6.2 05/03/2014    Urine Drug Screen:  No results found for: LABOPIA, COCAINSCRNUR, LABBENZ, AMPHETMU, THCU, LABBARB  Alcohol Level:   Recent Labs Lab 03/10/16 [place] <5    Other results: EKG: sinus rhythm at 98 bpm.  Imaging: Ct Head Wo Contrast  Result Date: 03/10/2016 CLINICAL DATA:  Acute onset of slurred speech and confusion. Blurred vision. EXAM: CT HEAD WITHOUT CONTRAST TECHNIQUE: Contiguous axial images were obtained from the base of the skull through the vertex without intravenous contrast. COMPARISON:  08/30/2014.  MRI 09/01/2014. FINDINGS: Brain: Generalized brain atrophy. Chronic small-vessel ischemic changes throughout the deep and subcortical white matter. No sign of acute infarction, mass lesion, hemorrhage, hydrocephalus or extra-axial collection. Vascular: There is atherosclerotic calcification of the major vessels at the base of the brain. Skull: Negative Sinuses/Orbits: Clear/normal Other: None significant IMPRESSION: No acute finding by CT. Atrophy and chronic small-vessel ischemic changes. These results were called by telephone at the time  of interpretation on 03/10/2016 at 2:44 pm to Dr. Lavonia Drafts , who verbally acknowledged these results. Electronically Signed   By: Nelson Chimes M.D.   On: 03/10/2016 14:46    Assessment: 82 y.o. female presenting after onset of difficulty with speech and visual changes.  Patient improving after presentation therefore tPA not administered.  Head CT personally reviewed and shows no acute changes.  Patient may have been suffering from hypoperfusion due to decreased blood pressure as a consequence of repeated NTG administration.  Can not rule out TIA  as well.  Further work up recommended.  Patient on no antiplatelet therapy at home.    Stroke Risk Factors - diabetes mellitus  Plan: 1. HgbA1c, fasting lipid panel 2. MRI, MRA  of the brain without contrast 3. PT consult, OT consult, Speech consult 4. Echocardiogram 5. Carotid dopplers 6. Prophylactic therapy-Antiplatelet med: Aspirin - dose 325mg  daily 7. NPO until RN stroke swallow screen 8. Telemetry monitoring 9. Frequent neuro checks   Alexis Goodell, MD Neurology [phone] 03/10/2016, 3:45 PM

## 2016-03-11 ENCOUNTER — Observation Stay: Payer: Medicare Other

## 2016-03-11 ENCOUNTER — Observation Stay
Admit: 2016-03-11 | Discharge: 2016-03-11 | Disposition: A | Discharge status: Home / Self Care | Payer: Medicare Other | Attending: Internal Medicine | Admitting: Internal Medicine

## 2016-03-11 DIAGNOSIS — R471 Dysarthria and anarthria: Secondary | ICD-10-CM

## 2016-03-11 DIAGNOSIS — E119 Type 2 diabetes mellitus without complications: Secondary | ICD-10-CM | POA: Diagnosis not present

## 2016-03-11 DIAGNOSIS — G459 Transient cerebral ischemic attack, unspecified: Secondary | ICD-10-CM | POA: Diagnosis not present

## 2016-03-11 DIAGNOSIS — E785 Hyperlipidemia, unspecified: Secondary | ICD-10-CM | POA: Diagnosis not present

## 2016-03-11 DIAGNOSIS — N184 Chronic kidney disease, stage 4 (severe): Secondary | ICD-10-CM | POA: Diagnosis not present

## 2016-03-11 DIAGNOSIS — I6523 Occlusion and stenosis of bilateral carotid arteries: Secondary | ICD-10-CM | POA: Diagnosis not present

## 2016-03-11 LAB — LIPID PANEL
CHOLESTEROL: 238 mg/dL — AB (ref 0–200)
HDL: 38 mg/dL — ABNORMAL LOW (ref >40)
LDL CALC: 161 mg/dL — AB (ref 0–99)
Total CHOL/HDL Ratio: 6.3 RATIO
Triglycerides: 196 mg/dL — ABNORMAL HIGH (ref <150)
VLDL: 39 mg/dL (ref 0–40)

## 2016-03-11 LAB — URINE DRUG SCREEN, QUALITATIVE (ARMC ONLY)
AMPHETAMINES, UR SCREEN: NOT DETECTED
BENZODIAZEPINE, UR SCRN: NOT DETECTED
Barbiturates, Ur Screen: NOT DETECTED
Cannabinoid 50 Ng, Ur ~~LOC~~: NOT DETECTED
Cocaine Metabolite,Ur ~~LOC~~: NOT DETECTED
MDMA (Ecstasy)Ur Screen: NOT DETECTED
METHADONE SCREEN, URINE: NOT DETECTED
Opiate, Ur Screen: NOT DETECTED
Phencyclidine (PCP) Ur S: NOT DETECTED
TRICYCLIC, UR SCREEN: POSITIVE — AB

## 2016-03-11 LAB — BASIC METABOLIC PANEL
Anion gap: 8 (ref 5–15)
BUN: 25 mg/dL — AB (ref 6–20)
CO2: 26 mmol/L (ref 22–32)
Calcium: 9.2 mg/dL (ref 8.9–10.3)
Chloride: 105 mmol/L (ref 101–111)
Creatinine, Ser: 1.87 mg/dL — ABNORMAL HIGH (ref 0.44–1.00)
GFR calc Af Amer: 26 mL/min — ABNORMAL LOW (ref >60)
GFR, EST NON AFRICAN AMERICAN: 22 mL/min — AB (ref >60)
GLUCOSE: 98 mg/dL (ref 65–99)
POTASSIUM: 3.9 mmol/L (ref 3.5–5.1)
Sodium: 139 mmol/L (ref 135–145)

## 2016-03-11 LAB — CBC
HEMATOCRIT: 32.3 % — AB (ref 35.0–47.0)
Hemoglobin: 11.1 g/dL — ABNORMAL LOW (ref 12.0–16.0)
MCH: 30.8 pg (ref 26.0–34.0)
MCHC: 34.4 g/dL (ref 32.0–36.0)
MCV: 89.6 fL (ref 80.0–100.0)
Platelets: 208 10*3/uL (ref 150–440)
RBC: 3.61 MIL/uL — ABNORMAL LOW (ref 3.80–5.20)
RDW: 14.8 % — AB (ref 11.5–14.5)
WBC: 7.2 10*3/uL (ref 3.6–11.0)

## 2016-03-11 LAB — HEMOGLOBIN A1C: Hgb A1c MFr Bld: 6.3 % — ABNORMAL HIGH (ref 4.0–6.0)

## 2016-03-11 MED ORDER — ASPIRIN 81 MG PO TBEC: 81.0000 mg | DELAYED_RELEASE_TABLET | Freq: Every day | ORAL | 0 refills | Status: AC

## 2016-03-11 NOTE — Progress Notes (Signed)
Physical Therapy Evaluation Patient Details Name: Mariah Watkins MRN: [id number] DOB: 1923/05/24 Today'Watkins Date: 03/11/2016   History of Present Illness  Mariah Watkins  is a 82 y.o. female presents to the ER with an episode of unable to speak lasting for at least 30 minutes. They were on their way to get something to eat when she complained of chest pain. She ended up taking 3 nitroglycerin and her chest pain did get a little bit better. But she didn't feel well after that and wanted to go home and then couldn't talk. On the way from med bend over to the ER she got worse and couldn't follow commands. Normally she does walk with a walker. As per the family she did have previous chest pains in the past.  Clinical Impression  Patient is 82 yr old female who lives with her daughter and ambulates independently with her RW. She is independent with her RW for ambulation 200 feet and transfers and is independent with bed mobility. Her static and dynamic standing balance is good with AD.  She has a ramp to get into her home. She has no skilled PT needs at this time and does not need any PT at home.     Follow Up Recommendations No PT follow up    Equipment Recommendations       Recommendations for Other Services       Precautions / Restrictions Precautions Precautions: None Restrictions Weight Bearing Restrictions: No      Mobility  Bed Mobility Overal bed mobility: Independent                Transfers Overall transfer level: Modified independent Equipment used: Rolling walker (2 wheeled)             General transfer comment:  (independent from sit to stand with RW)  Ambulation/Gait Ambulation/Gait assistance: Modified independent (Device/Increase time) Ambulation Distance (Feet): 200 Feet Assistive device: Rolling walker (2 wheeled) Gait Pattern/deviations: Step-through pattern   Gait velocity interpretation: <1.8 ft/sec, indicative of risk for recurrent falls General  Gait Details:  (ambulates with RW SBA)  Stairs            Wheelchair Mobility    Modified Rankin (Stroke Patients Only)       Balance Overall balance assessment: Modified Independent                                           Pertinent Vitals/Pain Pain Assessment: No/denies pain    Home Living Family/patient expects to be discharged to:: Private residence Living Arrangements: Children Available Help at Discharge: Family Type of Home: House Home Access: Ramped entrance       Home Equipment: Environmental consultant - 2 wheels      Prior Function Level of Independence: Independent with assistive device(Watkins)               Hand Dominance        Extremity/Trunk Assessment   Upper Extremity Assessment: Overall WFL for tasks assessed           Lower Extremity Assessment: Overall WFL for tasks assessed         Communication   Communication: No difficulties  Cognition Arousal/Alertness: Awake/alert Behavior During Therapy: WFL for tasks assessed/performed Overall Cognitive Status: Within Functional Limits for tasks assessed  General Comments      Exercises        Assessment/Plan    PT Assessment Patent does not need any further PT services  PT Diagnosis Difficulty walking   PT Problem List    PT Treatment Interventions     PT Goals (Current goals can be found in the Care Plan section) Acute Rehab PT Goals Patient Stated Goal: to go home PT Goal Formulation: With patient Time For Goal Achievement: 03/11/16 Potential to Achieve Goals: Good    Frequency     Barriers to discharge        Co-evaluation               End of Session Equipment Utilized During Treatment: Gait belt Activity Tolerance: Patient tolerated treatment well Patient left: with call bell/phone within reach;with bed alarm set           Time: 1230-1300 PT Time Calculation (min) (ACUTE ONLY): 30 min   Charges:   PT  Evaluation $PT Eval Low Complexity: 1 Procedure PT Treatments $Gait Training: 8-22 mins   PT G CodesArelia Sneddon Watkins 03/11/2016, 1:38 PM

## 2016-03-11 NOTE — Discharge Summary (Signed)
[place] [facility] [place] NAME: Mariah Watkins    MR#:  [id number]  DATE OF BIRTH:  03-10-1923  DATE OF ADMISSION:  03/10/2016 ADMITTING PHYSICIAN: Loletha Grayer, MD  DATE OF DISCHARGE: 03/11/16  PRIMARY CARE PHYSICIAN: Sherrin Daisy, MD    ADMISSION DIAGNOSIS:  Dysarthria [R47.1] Transient cerebral ischemia, unspecified transient cerebral ischemia type [G45.9] Chest pain, unspecified chest pain type [R07.9]  DISCHARGE DIAGNOSIS:  TIA  SECONDARY DIAGNOSIS:   Past Medical History:  Diagnosis Date  . Cancer of breast, female ([place]) 01/01/2015  . Dementia   . Diabetes mellitus without complication ([place])   . Renal disorder     HOSPITAL COURSE:  Mariah Watkins  is a 82 y.o. female presents to the ER with an episode of unable to speak lasting for at least 30 minutes. 1. Dysarthria due to  TIA.  -negative MRI of the brain - Speech is back to normal at this time. Aspirin 81 mg daily (pt was not on it at home) 2. Chest pain likely from scar tissue from previous surgery. cardiac enzymes negative  3. History of dementia on Namenda 4. Chronic kidney disease stage IV 5. History of breast cancer 6. Hypothyroidism continue Synthroid 7. Type 2 diabetes diet control only 8. HL d/w pt and dter regarding statins. Pt does not want to start anything at present Will defer it to PCP  Overall at baseline D/c home Family agreeable  CONSULTS OBTAINED:  Treatment Team:  Catarina Hartshorn, MD  DRUG ALLERGIES:   Allergies  Allergen Reactions  . Sulfa Antibiotics Nausea And Vomiting    DISCHARGE MEDICATIONS:   Current Discharge Medication List    START taking these medications   Details  aspirin EC 81 MG EC tablet Take 1 tablet (81 mg total) by mouth daily. Qty: 30 tablet, Refills: 0      CONTINUE these medications which have NOT CHANGED   Details  Acetaminophen 500 MG coapsule Take 1 capsule by mouth daily as needed.    Associated  Diagnoses: Anemia, unspecified anemia type    Cyanocobalamin (RA VITAMIN B-12 TR) 1000 MCG TBCR Take by mouth.    ferrous fumarate (FERRO-SEQUELS) 50 MG CR tablet Take 50 mg by mouth 3 (three) times daily with meals.   Associated Diagnoses: Anemia, unspecified anemia type    latanoprost (XALATAN) 0.005 % ophthalmic solution Place 1 drop into both eyes at bedtime.    levothyroxine (SYNTHROID, LEVOTHROID) 125 MCG tablet Take 125 mcg by mouth daily before breakfast.    memantine (NAMENDA) 5 MG tablet Take by mouth.    nortriptyline (PAMELOR) 10 MG capsule Take 10 mg by mouth daily.    Associated Diagnoses: Anemia, unspecified anemia type    omeprazole (PRILOSEC) 20 MG capsule Take by mouth.   Associated Diagnoses: Anemia, unspecified anemia type    sodium bicarbonate 650 MG tablet Take 650 mg by mouth 2 (two) times daily.    nitroGLYCERIN (NITROSTAT) 0.4 MG SL tablet Place 0.4 mg under the tongue every 5 (five) minutes as needed.    Associated Diagnoses: Anemia, unspecified anemia type        If you experience worsening of your admission symptoms, develop shortness of breath, life threatening emergency, suicidal or homicidal thoughts you must seek medical attention immediately by calling 911 or calling your MD immediately  if symptoms less severe.  You Must read complete instructions/literature along with all the possible adverse reactions/side effects for all the Medicines you take and that  have been prescribed to you. Take any new Medicines after you have completely understood and accept all the possible adverse reactions/side effects.   Please note  You were cared for by a hospitalist during your hospital stay. If you have any questions about your discharge medications or the care you received while you were in the hospital after you are discharged, you can call the unit and asked to speak with the hospitalist on call if the hospitalist that took care of you is not available. Once  you are discharged, your primary care physician will handle any further medical issues. Please note that NO REFILLS for any discharge medications will be authorized once you are discharged, as it is imperative that you return to your primary care physician (or establish a relationship with a primary care physician if you do not have one) for your aftercare needs so that they can reassess your need for medications and monitor your lab values. Today   SUBJECTIVE   Doing well. No dysarthria or weakness  VITAL SIGNS:  Blood pressure (!) 164/77, pulse 93, temperature 98.2 F (36.8 C), temperature source Oral, resp. rate 18, height 5\' 6"  (1.676 m), weight 67.5 kg (148 lb 12.8 oz), SpO2 95 %.  I/O:   Intake/Output Summary (Last 24 hours) at 03/11/16 1447 Last data filed at 03/11/16 1408  Gross per 24 hour  Intake              240 ml  Output             1100 ml  Net             -860 ml    PHYSICAL EXAMINATION:  GENERAL:  82 y.o.-year-old patient lying in the bed with no acute distress.  EYES: Pupils equal, round, reactive to light and accommodation. No scleral icterus. Extraocular muscles intact.  HEENT: Head atraumatic, normocephalic. Oropharynx and nasopharynx clear.  NECK:  Supple, no jugular venous distention. No thyroid enlargement, no tenderness.  LUNGS: Normal breath sounds bilaterally, no wheezing, rales,rhonchi or crepitation. No use of accessory muscles of respiration.  CARDIOVASCULAR: S1, S2 normal. No murmurs, rubs, or gallops.  ABDOMEN: Soft, non-tender, non-distended. Bowel sounds present. No organomegaly or mass.  EXTREMITIES: No pedal edema, cyanosis, or clubbing.  NEUROLOGIC: Cranial nerves II through XII are intact. Muscle strength 5/5 in all extremities. Sensation intact. Gait not checked.  PSYCHIATRIC: The patient is alert and oriented x 3.  SKIN: No obvious rash, lesion, or ulcer.   DATA REVIEW:   CBC   Recent Labs Lab 03/11/16 0448  WBC 7.2  HGB 11.1*  HCT  32.3*  PLT 208    Chemistries   Recent Labs Lab 03/10/16 1450 03/11/16 0448  NA 138 139  K 4.2 3.9  CL 103 105  CO2 26 26  GLUCOSE 103* 98  BUN 24* 25*  CREATININE 1.93* 1.87*  CALCIUM 9.3 9.2  AST 18  --   ALT 14  --   ALKPHOS 81  --   BILITOT 0.4  --     Microbiology Results   No results found for this or any previous visit (from the past 240 hour(s)).  RADIOLOGY:  Ct Head Wo Contrast  Result Date: 03/10/2016 CLINICAL DATA:  Acute onset of slurred speech and confusion. Blurred vision. EXAM: CT HEAD WITHOUT CONTRAST TECHNIQUE: Contiguous axial images were obtained from the base of the skull through the vertex without intravenous contrast. COMPARISON:  08/30/2014.  MRI 09/01/2014. FINDINGS: Brain: Generalized brain atrophy.  Chronic small-vessel ischemic changes throughout the deep and subcortical white matter. No sign of acute infarction, mass lesion, hemorrhage, hydrocephalus or extra-axial collection. Vascular: There is atherosclerotic calcification of the major vessels at the base of the brain. Skull: Negative Sinuses/Orbits: Clear/normal Other: None significant IMPRESSION: No acute finding by CT. Atrophy and chronic small-vessel ischemic changes. These results were called by telephone at the time of interpretation on 03/10/2016 at 2:44 pm to Dr. Lavonia Drafts , who verbally acknowledged these results. Electronically Signed   By: Nelson Chimes M.D.   On: 03/10/2016 14:46   Mr Brain Wo Contrast  Result Date: 03/11/2016 CLINICAL DATA:  Dysarthria.  Episode of aphasia lasting 30 minutes. EXAM: MRI HEAD WITHOUT CONTRAST TECHNIQUE: Multiplanar, multiecho pulse sequences of the brain and surrounding structures were obtained without intravenous contrast. COMPARISON:  CT head without contrast 03/10/2016. MRI brain 09/01/2014 FINDINGS: Brain: The diffusion-weighted images demonstrate no evidence for acute or subacute infarction. Acute hemorrhage or mass lesion is present. Mild atrophy and  periventricular white matter changes are stable. The basal ganglia are intact. The internal auditory canals are within normal limits. The brainstem and cerebellum are unremarkable. Vascular: Flow is present in the major intracranial arteries. Skull and upper cervical spine: The skullbase is within normal limits. Midline sagittal structures are unremarkable. Mild degenerative changes are present in the upper cervical spine. Sinuses/Orbits: The paranasal sinuses and mastoid air cells are clear. Bilateral lens replacements are present. The globes and orbits are otherwise intact. Other: None IMPRESSION: 1. No acute intracranial abnormality or significant interval change. 2. Stable atrophy and white matter disease. This likely reflects the sequela of chronic microvascular ischemia. Electronically Signed   By: San Morelle M.D.   On: 03/11/2016 14:31     Management plans discussed with the patient, family and they are in agreement.  CODE STATUS:     Code Status Orders        Start     Ordered   03/10/16 1639  Full code  Continuous     03/10/16 1640    Code Status History    Date Active Date Inactive Code Status Order ID Comments User Context   03/10/2016  4:40 PM 03/11/2016  7:16 AM Full Code [id number]  Loletha Grayer, MD ED      TOTAL TIME TAKING CARE OF THIS PATIENT: 40 minutes.    Mariah Watkins M.D on 03/11/2016 at 2:47 PM  Between 7am to 6pm - Pager - [phone] After 6pm go to [email or web address] - password [email or web address] [facility]  [facility]  Office  [phone]  CC: Primary care physician; Sherrin Daisy, MD

## 2016-03-11 NOTE — Progress Notes (Signed)
Subjective: Per daughter patient is back to baseline.  No new neurological complaints.    Objective: Current vital signs: BP (!) 164/77 (BP Location: Left Arm)   Pulse 93   Temp 98.2 F (36.8 C) (Oral)   Resp 18   Ht 5\' 6"  (1.676 m)   Wt 67.5 kg (148 lb 12.8 oz)   SpO2 95%   BMI 24.02 kg/m  Vital signs in last 24 hours: Temp:  [97.6 F (36.4 C)-98.4 F (36.9 C)] 98.2 F (36.8 C) (09/09 1137) Pulse Rate:  [69-97] 93 (09/09 1137) Resp:  [11-20] 18 (09/09 1137) BP: (117-185)/(72-130) 164/77 (09/09 1137) SpO2:  [94 %-100 %] 95 % (09/09 1137) Weight:  [65.5 kg (144 lb 6.4 oz)-67.5 kg (148 lb 12.8 oz)] 67.5 kg (148 lb 12.8 oz) (09/08 1758)  Intake/Output from previous day: 09/08 0701 - 09/09 0700 In: -  Out: 400 [Urine:400] Intake/Output this shift: Total I/O In: -  Out: 400 [Urine:400] Nutritional status: Diet heart healthy/carb modified Room service appropriate? Yes; Fluid consistency: Thin  Neurologic Exam: Mental Status: Alert.  Speech fluent.  Able to follow simple commands without difficulty.   Cranial Nerves: II: Discs flat bilaterally; Visual fields grossly normal, pupils equal, round, reactive to light and accommodation III,IV, VI: ptosis not present, extra-ocular motions intact bilaterally V,VII: smile symmetric, facial light touch sensation normal bilaterally VIII: hearing normal bilaterally IX,X: gag reflex present XI: bilateral shoulder shrug XII: midline tongue extension Motor: Right :            Upper extremity   5/5                                                                              Left:     Upper extremity   5/5                       Lower extremity   5-/5                                                                                                   Lower extremity   5-/5  Lab Results: Basic Metabolic Panel:  Recent Labs Lab 03/10/16 1450 03/11/16 0448  NA 138 139  K 4.2 3.9  CL 103 105  CO2 26 26  GLUCOSE 103* 98  BUN 24* 25*   CREATININE 1.93* 1.87*  CALCIUM 9.3 9.2    Liver Function Tests:  Recent Labs Lab 03/10/16 1450  AST 18  ALT 14  ALKPHOS 81  BILITOT 0.4  PROT 7.3  ALBUMIN 3.8   No results for input(s): LIPASE, AMYLASE in the last 168 hours. No results for input(s): AMMONIA in the last 168 hours.  CBC:  Recent Labs Lab 03/10/16 1450 03/11/16 0448  WBC 9.6 7.2  NEUTROABS 5.4  --   HGB 12.0 11.1*  HCT 36.2 32.3*  MCV 91.4 89.6  PLT 224 208    Cardiac Enzymes:  Recent Labs Lab 03/10/16 1450 03/10/16 1840 03/10/16 2228  TROPONINI <0.03 <0.03 <0.03    Lipid Panel:  Recent Labs Lab 03/11/16 0448  CHOL 238*  TRIG 196*  HDL 38*  CHOLHDL 6.3  VLDL 39  LDLCALC 161*    CBG:  Recent Labs Lab 03/10/16 1450  GLUCAP 96    Microbiology: Results for orders placed or performed in visit on 05/02/14  Urine culture     Status: None   Collection Time: 05/02/14  1:18 PM  Result Value Ref Range Status   Micro Text Report   Final       SOURCE: CLEAN CATCH    ORGANISM 1                >100,000 CFU/ML ESCHERICHIA COLI   COMMENT                   WITH MIXED-BACTERIAL-ORGANISMS   COMMENT                   -   ANTIBIOTIC                    ORG#1     AMPICILLIN                    S         CEFAZOLIN                     S         CEFOXITIN                     S         CEFTAZIDIME                   S         CEFTRIAXONE                   S         CIPROFLOXACIN                 S         GENTAMICIN                    S         IMIPENEM                      S         LEVOFLOXACIN                  S         TRIMETH/SULFA                 S             Coagulation Studies:  Recent Labs  03/10/16 1450  LABPROT 12.6  INR 0.94    Imaging: Ct Head Wo Contrast  Result Date: 03/10/2016 CLINICAL DATA:  Acute onset of slurred speech and confusion. Blurred vision. EXAM: CT HEAD WITHOUT CONTRAST TECHNIQUE: Contiguous axial images were obtained from the base of the skull  through the vertex without intravenous contrast. COMPARISON:  08/30/2014.  MRI 09/01/2014. FINDINGS: Brain: Generalized brain atrophy. Chronic small-vessel ischemic changes throughout the  deep and subcortical white matter. No sign of acute infarction, mass lesion, hemorrhage, hydrocephalus or extra-axial collection. Vascular: There is atherosclerotic calcification of the major vessels at the base of the brain. Skull: Negative Sinuses/Orbits: Clear/normal Other: None significant IMPRESSION: No acute finding by CT. Atrophy and chronic small-vessel ischemic changes. These results were called by telephone at the time of interpretation on 03/10/2016 at 2:44 pm to Dr. Lavonia Drafts , who verbally acknowledged these results. Electronically Signed   By: Nelson Chimes M.D.   On: 03/10/2016 14:46    Medications:  I have reviewed the patient's current medications. Scheduled: . aspirin EC  81 mg Oral Daily  . enoxaparin (LOVENOX) injection  30 mg Subcutaneous Q24H  . Ferrous Fumarate  1 tablet Oral BID WC  . latanoprost  1 drop Both Eyes QHS  . levothyroxine  125 mcg Oral QAC breakfast  . memantine  5 mg Oral BID  . nortriptyline  10 mg Oral Daily  . pantoprazole  40 mg Oral Daily  . sodium bicarbonate  650 mg Oral BID    Assessment/Plan: Patient back to baseline.  Unclear if patient may have been suffering from hypoperfusion as a consequence of repeated NTG administration versus TIA/infarct.  Stroke work up pending.    Recommendations: 1. Plavix 75mg  daily 2. Will continue to follow with you    LOS: 0 days   Alexis Goodell, MD Neurology [phone] 03/11/2016  12:55 PM

## 2016-03-11 NOTE — Progress Notes (Signed)
Per MD pt ok to d/c to home post MRI if negative. MRI negative for anything acute, MD notified and order to d/c pt to home placed. No HH needs. IV and tele removed and discharge instructions given to pt with daughters present. Verbal acknowledgment of understanding and pt escorted off unit via wheelchair by nursing.

## 2016-03-12 LAB — ECHOCARDIOGRAM COMPLETE
HEIGHTINCHES: 66 in
Weight: 2380.8 oz

## 2016-03-12 NOTE — Care Management Obs Status (Signed)
Secretary NOTIFICATION   Patient Details  Name: MILLS AUTRY MRN: [id number] Date of Birth: 08/14/1922   Medicare Observation Status Notification Given:  No (discharge order in less than 24 hours)    Ival Bible, RN 03/12/2016, 9:48 AM

## 2016-03-13 NOTE — Progress Notes (Signed)
   03/11/16 1300  PT Visit Information  Last PT Received On 03/11/16  Assistance Needed +1  History of Present Illness Mariah Watkins  is a 82 y.o. female presents to the ER with an episode of unable to speak lasting for at least 30 minutes. They were on their way to get something to eat when she complained of chest pain. She ended up taking 3 nitroglycerin and her chest pain did get a little bit better. But she didn't feel well after that and wanted to go home and then couldn't talk. On the way from med bend over to the ER she got worse and couldn't follow commands. Normally she does walk with a walker. As per the family she did have previous chest pains in the past.  Precautions  Precautions None  Restrictions  Weight Bearing Restrictions No  Home Living  Family/patient expects to be discharged to: Private residence  [place]  Available Help at Discharge Family  Type of [place] entrance  [place] - 2 wheels  Prior Function  Level of [place] with assistive device(s)  Communication  Communication No difficulties  Pain Assessment  Pain Assessment No/denies pain  Cognition  Arousal/Alertness Awake/alert  Behavior During Therapy WFL for tasks assessed/performed  Overall Cognitive Status Within Functional Limits for tasks assessed  Upper Extremity Assessment  Upper Extremity Assessment Overall WFL for tasks assessed  Lower Extremity Assessment  Lower Extremity Assessment Overall WFL for tasks assessed  Bed Mobility  Overal bed mobility Independent  Transfers  Overall transfer level Modified independent  Equipment used Rolling walker (2 wheeled)  General transfer comment (independent from sit to stand with RW)  Ambulation/Gait  Ambulation/Gait assistance Modified independent (Device/Increase time)  Ambulation Distance (Feet) 200 Feet  Assistive device Rolling walker (2 wheeled)  Gait Pattern/deviations  Step-through pattern  General Gait Details (ambulates with RW SBA)  Gait velocity interpretation <1.8 ft/sec, indicative of risk for recurrent falls  Balance  Overall balance assessment Modified Independent  PT - End of Session  Equipment Utilized During Treatment Gait belt  Activity Tolerance Patient tolerated treatment well  Patient left with call bell/phone within reach;with bed alarm set  PT Assessment  PT Therapy Diagnosis  Difficulty walking  PT Recommendation/Assessment Patent does not need any further PT services  No Skilled PT Patient at baseline level of functioning  PT Recommendation  Follow Up Recommendations No PT follow up  Acute Rehab PT Goals  Patient Stated Goal to go home  PT Goal Formulation With patient  Time For Goal Achievement 03/11/16  Potential to Achieve Goals Good  PT Time Calculation  PT Start Time (ACUTE ONLY) 1230  PT Stop Time (ACUTE ONLY) 1300  PT Time Calculation (min) (ACUTE ONLY) 30 min  PT G-Codes **NOT FOR INPATIENT CLASS**  Functional Assessment Tool Used clinical judgement  Functional Limitation Mobility: Walking and moving around  Mobility: Walking and Moving Around Current Status [id number]) CJ  Mobility: Walking and Moving Around Goal Status [id number]) CI  PT General Charges  $$ ACUTE PT VISIT 1 Procedure  PT Evaluation  $PT Eval Low Complexity 1 Procedure  PT Treatments  $Gait Training 8-22 mins  Late entry g codes added after review of documentation Alanson Puls, PT, DPT

## 2016-03-17 DIAGNOSIS — G459 Transient cerebral ischemic attack, unspecified: Secondary | ICD-10-CM | POA: Diagnosis not present

## 2016-03-17 DIAGNOSIS — Z23 Encounter for immunization: Secondary | ICD-10-CM | POA: Diagnosis not present

## 2016-05-04 DIAGNOSIS — S299XXA Unspecified injury of thorax, initial encounter: Secondary | ICD-10-CM | POA: Diagnosis not present

## 2016-05-04 DIAGNOSIS — R0781 Pleurodynia: Secondary | ICD-10-CM | POA: Diagnosis not present

## 2016-05-08 DIAGNOSIS — R0781 Pleurodynia: Secondary | ICD-10-CM | POA: Diagnosis not present

## 2016-05-08 DIAGNOSIS — Z09 Encounter for follow-up examination after completed treatment for conditions other than malignant neoplasm: Secondary | ICD-10-CM | POA: Diagnosis not present

## 2016-05-31 ENCOUNTER — Other Ambulatory Visit: Payer: Self-pay

## 2016-05-31 DIAGNOSIS — I639 Cerebral infarction, unspecified: Secondary | ICD-10-CM | POA: Insufficient documentation

## 2016-06-06 ENCOUNTER — Encounter: Payer: Self-pay | Admitting: General Surgery

## 2016-06-06 ENCOUNTER — Ambulatory Visit (INDEPENDENT_AMBULATORY_CARE_PROVIDER_SITE_OTHER): Payer: Medicare Other | Admitting: General Surgery

## 2016-06-06 DIAGNOSIS — K432 Incisional hernia without obstruction or gangrene: Secondary | ICD-10-CM | POA: Insufficient documentation

## 2016-06-06 NOTE — Progress Notes (Signed)
Outpatient Surgical Follow Up  06/06/2016  Mariah Watkins is an 82 y.o. female.   Chief Complaint  Patient presents with  . Other    colostomy reversal two years ago, Dr. Pat Patrick,  now sharp pain    HPI: 82 year old female, prior patient of Dr. Rolin Barry, returns to clinic today for follow-up of an intermittent bulge to her abdomen. Per the patient and her daughter she has noticed and occasionally tender bulge between her prior ostomy site and her midline incision. It is mostly when she is ambulating. It does not bother her every day. It is not always tender. She denies any fevers, chills, nausea, vomiting, chest pain, shortness of breath, diarrhea, constipation. She is not really interested in surgery but just wants to know that there is nothing that has to be done.  Past Medical History:  Diagnosis Date  . Cancer of breast, female ([place]) 01/01/2015  . Dementia   . Diabetes mellitus without complication ([place])   . Renal disorder     Past Surgical History:  Procedure Laterality Date  . BOWEL RESECTION    . BREAST SURGERY    . cataracts    . retinal surgey    . SKIN BIOPSY      Family History  Problem Relation Age of Onset  . Diabetes Mother   . Hypertension Mother   . Prostate cancer Father     Social History:  reports that she has quit smoking. She has never used smokeless tobacco. She reports that she does not drink alcohol or use drugs.  Allergies:  Allergies  Allergen Reactions  . Sulfa Antibiotics Nausea And Vomiting    Medications reviewed.    ROS A multipoint review of systems was completed. All pertinent positives and negatives are documented within the history of present illness remainder were negative.   BP (!) 144/82   Pulse 100   Temp 97.8 F (36.6 C) (Oral)   Wt 66.2 kg (146 lb)   BMI 23.57 kg/m   Physical Exam Gen.: No acute distress Neck: Supple and nontender Chest: Clear to auscultation without any use of accessory muscles or splinting Heart:  Regular rhythm Abdomen: Soft, nontender, nondistended. Well healed midline and right lower quadrant ostomy site. There is a palpable fascial defect between the 2 incision sites. It is fully reduced while supine and easily reducible while standing. Extremities: Patient ambulates well   No results found for this or any previous visit (from the past 48 hour(s)). No results found.  Assessment/Plan:  1. Incisional hernia, without obstruction or gangrene 82 year old female with what is likely an incisional hernia from her prior ostomy site. It is fully reduced at this time. Discussed the signs and symptoms of incarceration or strangulation and to return to clinic medially should they occur. Otherwise discussed possibly using abdominal binder to help decrease the risk of that happening. There is no current indication that surgery is required in this 82 year old female. Patient and her daughter voiced understanding and appreciation of their care. Patient will follow up on an as-needed basis.  A total of 15 minutes was used for this encounter with greater than 50% of it being 4 counseling and coordination of care.   Clayburn Pert, MD FACS General Surgeon  06/06/2016,12:12 PM

## 2016-06-06 NOTE — Patient Instructions (Signed)
Please give us a call if you have any questions or concerns. 

## 2016-06-12 DIAGNOSIS — D631 Anemia in chronic kidney disease: Secondary | ICD-10-CM | POA: Diagnosis not present

## 2016-06-12 DIAGNOSIS — N184 Chronic kidney disease, stage 4 (severe): Secondary | ICD-10-CM | POA: Diagnosis not present

## 2016-06-12 DIAGNOSIS — N2581 Secondary hyperparathyroidism of renal origin: Secondary | ICD-10-CM | POA: Diagnosis not present

## 2016-06-12 DIAGNOSIS — I10 Essential (primary) hypertension: Secondary | ICD-10-CM | POA: Diagnosis not present

## 2016-06-16 DIAGNOSIS — N184 Chronic kidney disease, stage 4 (severe): Secondary | ICD-10-CM | POA: Diagnosis not present

## 2016-06-17 ENCOUNTER — Encounter: Payer: Self-pay | Admitting: Emergency Medicine

## 2016-06-17 ENCOUNTER — Emergency Department: Payer: Medicare Other

## 2016-06-17 ENCOUNTER — Emergency Department
Admission: EM | Admit: 2016-06-17 | Discharge: 2016-06-17 | Disposition: A | Discharge status: Home / Self Care | Payer: Medicare Other | Attending: Emergency Medicine | Admitting: Emergency Medicine

## 2016-06-17 DIAGNOSIS — Y999 Unspecified external cause status: Secondary | ICD-10-CM | POA: Insufficient documentation

## 2016-06-17 DIAGNOSIS — Z853 Personal history of malignant neoplasm of breast: Secondary | ICD-10-CM | POA: Diagnosis not present

## 2016-06-17 DIAGNOSIS — W19XXXA Unspecified fall, initial encounter: Secondary | ICD-10-CM

## 2016-06-17 DIAGNOSIS — Z79899 Other long term (current) drug therapy: Secondary | ICD-10-CM | POA: Diagnosis not present

## 2016-06-17 DIAGNOSIS — S299XXA Unspecified injury of thorax, initial encounter: Secondary | ICD-10-CM | POA: Diagnosis present

## 2016-06-17 DIAGNOSIS — W1809XA Striking against other object with subsequent fall, initial encounter: Secondary | ICD-10-CM | POA: Diagnosis not present

## 2016-06-17 DIAGNOSIS — Y929 Unspecified place or not applicable: Secondary | ICD-10-CM | POA: Insufficient documentation

## 2016-06-17 DIAGNOSIS — R42 Dizziness and giddiness: Secondary | ICD-10-CM | POA: Diagnosis not present

## 2016-06-17 DIAGNOSIS — Z87891 Personal history of nicotine dependence: Secondary | ICD-10-CM | POA: Diagnosis not present

## 2016-06-17 DIAGNOSIS — Y939 Activity, unspecified: Secondary | ICD-10-CM | POA: Diagnosis not present

## 2016-06-17 DIAGNOSIS — S20212A Contusion of left front wall of thorax, initial encounter: Secondary | ICD-10-CM | POA: Diagnosis not present

## 2016-06-17 DIAGNOSIS — E039 Hypothyroidism, unspecified: Secondary | ICD-10-CM | POA: Diagnosis not present

## 2016-06-17 DIAGNOSIS — Z7982 Long term (current) use of aspirin: Secondary | ICD-10-CM | POA: Diagnosis not present

## 2016-06-17 DIAGNOSIS — E119 Type 2 diabetes mellitus without complications: Secondary | ICD-10-CM | POA: Insufficient documentation

## 2016-06-17 LAB — BASIC METABOLIC PANEL
Anion gap: 9 (ref 5–15)
BUN: 33 mg/dL — ABNORMAL HIGH (ref 6–20)
CHLORIDE: 108 mmol/L (ref 101–111)
CO2: 23 mmol/L (ref 22–32)
CREATININE: 2.08 mg/dL — AB (ref 0.44–1.00)
Calcium: 9.2 mg/dL (ref 8.9–10.3)
GFR calc Af Amer: 23 mL/min — ABNORMAL LOW (ref >60)
GFR calc non Af Amer: 20 mL/min — ABNORMAL LOW (ref >60)
Glucose, Bld: 143 mg/dL — ABNORMAL HIGH (ref 65–99)
POTASSIUM: 4 mmol/L (ref 3.5–5.1)
SODIUM: 140 mmol/L (ref 135–145)

## 2016-06-17 LAB — CBC
HEMATOCRIT: 34.9 % — AB (ref 35.0–47.0)
Hemoglobin: 11.7 g/dL — ABNORMAL LOW (ref 12.0–16.0)
MCH: 31.2 pg (ref 26.0–34.0)
MCHC: 33.6 g/dL (ref 32.0–36.0)
MCV: 92.9 fL (ref 80.0–100.0)
PLATELETS: 233 10*3/uL (ref 150–440)
RBC: 3.76 MIL/uL — AB (ref 3.80–5.20)
RDW: 14.5 % (ref 11.5–14.5)
WBC: 8.3 10*3/uL (ref 3.6–11.0)

## 2016-06-17 LAB — URINALYSIS, COMPLETE (UACMP) WITH MICROSCOPIC
BILIRUBIN URINE: NEGATIVE
Glucose, UA: 50 mg/dL — AB
Hgb urine dipstick: NEGATIVE
KETONES UR: NEGATIVE mg/dL
Nitrite: NEGATIVE
Protein, ur: 100 mg/dL — AB
Specific Gravity, Urine: 1.01 (ref 1.005–1.030)
pH: 6 (ref 5.0–8.0)

## 2016-06-17 LAB — TROPONIN I

## 2016-06-17 NOTE — ED Triage Notes (Signed)
Patient to ER for c/o fall at approx 0815 this am. Patient reports dizziness since last night. Has recently had BP medicine changed. Patient fell and hit head on arm of chair, hit left chest on counter top. Patient denies being on blood thinners.

## 2016-06-17 NOTE — ED Notes (Signed)
Pt taken out by w/c

## 2016-06-17 NOTE — ED Notes (Signed)
Daughter states the pt has history of dizziness and has seen her pcp for it. Recently placed back on bp meds d/t her ckd and htn but daughter states she feels the bp meds is what causes her dizziness but the dizziness has been ongoing.

## 2016-06-17 NOTE — ED Provider Notes (Signed)
[facility] Emergency Department Provider Note   ____________________________________________    I have reviewed the triage vital signs and the nursing notes.   HISTORY  Chief Complaint Fall     HPI Mariah Watkins is a 82 y.o. female who presents after a fall. Patient lost her balance and fell forward against her walker but her daughter was able to stop her from hitting the floor. Patient struck her left chest against the cross bar of her walker and complains of pain in this area. This fall happened this morning. No head injury. No blood thinners. No focal deficits. Patient has had intermittent dizziness over the past several weeks. This is being worked on by her nephrologist. Mariah Watkins also been working on her blood pressure.   Past Medical History:  Diagnosis Date  . Cancer of breast, female ([place]) 01/01/2015  . Dementia   . Diabetes mellitus without complication ([place])   . Renal disorder     Patient Active Problem List   Diagnosis Date Noted  . Incisional hernia, without obstruction or gangrene 06/06/2016  . Stroke ([place]) 05/31/2016  . Dysarthria 03/10/2016  . Moderate dementia without behavioral disturbance 07/29/2015  . B12 deficiency 04/30/2015  . Headache disorder 04/30/2015  . Cancer of breast, female ([place]) 01/01/2015  . Adenomatous polyp 01/01/2015  . Gout 01/01/2015  . Benign essential HTN 12/04/2014  . Carotid artery narrowing 06/17/2014  . Paroxysmal supraventricular tachycardia ([place]) 06/17/2014  . Controlled diabetes mellitus type II without complication ([place]) [id number]  . Arteriovenous malformation of gastrointestinal tract 03/25/2014  . Hypercholesterolemia 03/25/2014  . Hypothyroidism 03/25/2014  . Ulcerative colitis ([place]) 03/25/2014  . Arteriosclerosis of coronary artery 02/18/2014  . Renal artery stenosis ([place]) 02/18/2014    Past Surgical History:  Procedure Laterality Date  . BOWEL RESECTION    . BREAST SURGERY    .  cataracts    . retinal surgey    . SKIN BIOPSY      Prior to Admission medications   Medication Sig Start Date End Date Taking? Authorizing Provider  Acetaminophen 500 MG coapsule Take 1 capsule by mouth daily as needed.  03/25/14   Historical Provider, MD  aspirin EC 81 MG EC tablet Take 1 tablet (81 mg total) by mouth daily. 03/11/16   Fritzi Mandes, MD  Cyanocobalamin (RA VITAMIN B-12 TR) 1000 MCG TBCR Take by mouth.    Historical Provider, MD  ferrous fumarate (FERRO-SEQUELS) 50 MG CR tablet Take 50 mg by mouth 3 (three) times daily with meals.    Historical Provider, MD  latanoprost (XALATAN) 0.005 % ophthalmic solution Place 1 drop into both eyes at bedtime.    Historical Provider, MD  levothyroxine (SYNTHROID, LEVOTHROID) 125 MCG tablet Take 125 mcg by mouth daily before breakfast.    Historical Provider, MD  memantine (NAMENDA) 5 MG tablet Take by mouth. 07/29/15 07/28/16  Historical Provider, MD  nitroGLYCERIN (NITROSTAT) 0.4 MG SL tablet Place 0.4 mg under the tongue every 5 (five) minutes as needed.  03/25/14   Historical Provider, MD  nortriptyline (PAMELOR) 10 MG capsule Take 10 mg by mouth daily.  10/29/14   Historical Provider, MD  omeprazole (PRILOSEC) 20 MG capsule Take by mouth. 12/07/14   Historical Provider, MD  sodium bicarbonate 650 MG tablet Take 650 mg by mouth 2 (two) times daily.    Historical Provider, MD     Allergies Sulfa antibiotics  Family History  Problem Relation Age of Onset  . Diabetes Mother   .  Hypertension Mother   . Prostate cancer Father     Social History Social History  Substance Use Topics  . Smoking status: Former Research scientist (life sciences)  . Smokeless tobacco: Never Used  . Alcohol use No    Review of Systems  Constitutional: Dizziness as above none currently Eyes: No visual changes.  ENT: No Neck pain Cardiovascular:Chest wall discomfort Respiratory: Denies shortness of breath. Gastrointestinal: No abdominal pain.  No nausea, no vomiting.   Genitourinary:  Negative for dysuria. No frequency Musculoskeletal: Negative for back pain. Skin: Negative for bruising or abrasion Neurological: Negative for headaches or weakness  10-point ROS otherwise negative.  ____________________________________________   PHYSICAL EXAM:  VITAL SIGNS: ED Triage Vitals  Enc Vitals Group     BP 06/17/16 1439 (!) 199/96     Pulse Rate 06/17/16 1439 86     Resp 06/17/16 1439 20     Temp 06/17/16 1439 97.5 F (36.4 C)     Temp Source 06/17/16 1439 Oral     SpO2 06/17/16 1439 100 %     Weight 06/17/16 1439 146 lb (66.2 kg)     Height 06/17/16 1439 5\' 5"  (1.651 m)     Head Circumference --      Peak Flow --      Pain Score 06/17/16 1440 9     Pain Loc --      Pain Edu? --      Excl. in [place]? --     Constitutional: Alert and oriented. No acute distress. Pleasant and interactive Eyes: Conjunctivae are normal.  Head: Atraumatic. Nose: No congestion/rhinnorhea. Mouth/Throat: Mucous membranes are moist.    Cardiovascular: Normal rate, regular rhythm. Grossly normal heart sounds.  Good peripheral circulation.No chest wall bony abnormalities although the patient is tender along the left lateral mid chest wall. No crepitus. No ecchymosis or abrasion or laceration. Respiratory: Normal respiratory effort.  No retractions. Lungs CTAB. Gastrointestinal: Soft and nontender. No distention.  No CVA tenderness. Genitourinary: deferred Musculoskeletal: No lower extremity tenderness nor edema.  Warm and well perfused Neurologic:  Normal speech and language. No gross focal neurologic deficits are appreciated.  Skin:  Skin is warm, dry and intact. No rash noted. Psychiatric: Mood and affect are normal. Speech and behavior are normal.  ____________________________________________   LABS (all labs ordered are listed, but only abnormal results are displayed)  Labs Reviewed  BASIC METABOLIC PANEL - Abnormal; Notable for the following:       Result Value   Glucose, Bld  143 (*)    BUN 33 (*)    Creatinine, Ser 2.08 (*)    GFR calc non Af Amer 20 (*)    GFR calc Af Amer 23 (*)    All other components within normal limits  URINALYSIS, COMPLETE (UACMP) WITH MICROSCOPIC - Abnormal; Notable for the following:    Color, Urine STRAW (*)    APPearance CLEAR (*)    Glucose, UA 50 (*)    Protein, ur 100 (*)    Leukocytes, UA MODERATE (*)    Bacteria, UA RARE (*)    Squamous Epithelial / LPF 0-5 (*)    All other components within normal limits  CBC - Abnormal; Notable for the following:    RBC 3.76 (*)    Hemoglobin 11.7 (*)    HCT 34.9 (*)    All other components within normal limits  TROPONIN I   ____________________________________________  EKG  ED ECG REPORT I, Lavonia Drafts, the attending physician, personally viewed and interpreted  this ECG.  Date: 06/17/2016  Rate: 82 Rhythm: normal sinus rhythm QRS Axis: normal Intervals: normal ST/T Wave abnormalities: normal Conduction Disturbances: Right bundle-branch block   ____________________________________________  RADIOLOGY  X-rays unremarkable ____________________________________________   PROCEDURES  Procedure(s) performed: No    Critical Care performed: No ____________________________________________   INITIAL IMPRESSION / ASSESSMENT AND PLAN / ED COURSE  Pertinent labs & imaging results that were available during my care of the patient were reviewed by me and considered in my medical decision making (see chart for details).  Patient presents after striking her chest wall against her walker. No significant injury on exam, x-ray is reassuring. Lab work is reassuring. As an aside Patient does have white blood cells in urine but denies dysuria or frequency, she is afebrile with a heart rate that is normal. We'll send urine culture as this was not related to what happened today  Clinical Course    ____________________________________________   FINAL CLINICAL IMPRESSION(S) /  ED DIAGNOSES  Final diagnoses:  Fall, initial encounter  Contusion of left front wall of thorax, initial encounter      NEW MEDICATIONS STARTED DURING THIS VISIT:  Discharge Medication List as of 06/17/2016  6:49 PM       Note:  This document was prepared using Dragon voice recognition software and may include unintentional dictation errors.    Lavonia Drafts, MD 06/17/16 2103

## 2016-06-19 LAB — URINE CULTURE

## 2016-07-04 DIAGNOSIS — R300 Dysuria: Secondary | ICD-10-CM | POA: Diagnosis not present

## 2016-07-04 DIAGNOSIS — R0989 Other specified symptoms and signs involving the circulatory and respiratory systems: Secondary | ICD-10-CM | POA: Diagnosis not present

## 2016-07-06 ENCOUNTER — Encounter: Payer: Self-pay | Admitting: Intensive Care

## 2016-07-06 ENCOUNTER — Emergency Department
Admission: EM | Admit: 2016-07-06 | Discharge: 2016-07-06 | Disposition: A | Discharge status: Home / Self Care | Payer: Medicare Other | Attending: Emergency Medicine | Admitting: Emergency Medicine

## 2016-07-06 DIAGNOSIS — Z7982 Long term (current) use of aspirin: Secondary | ICD-10-CM | POA: Diagnosis not present

## 2016-07-06 DIAGNOSIS — E119 Type 2 diabetes mellitus without complications: Secondary | ICD-10-CM | POA: Insufficient documentation

## 2016-07-06 DIAGNOSIS — N390 Urinary tract infection, site not specified: Secondary | ICD-10-CM | POA: Diagnosis not present

## 2016-07-06 DIAGNOSIS — E039 Hypothyroidism, unspecified: Secondary | ICD-10-CM | POA: Insufficient documentation

## 2016-07-06 DIAGNOSIS — Z87891 Personal history of nicotine dependence: Secondary | ICD-10-CM | POA: Diagnosis not present

## 2016-07-06 DIAGNOSIS — R531 Weakness: Secondary | ICD-10-CM | POA: Diagnosis present

## 2016-07-06 HISTORY — DX: Disorder of thyroid, unspecified: E07.9

## 2016-07-06 LAB — URINALYSIS, COMPLETE (UACMP) WITH MICROSCOPIC
BILIRUBIN URINE: NEGATIVE
Bacteria, UA: NONE SEEN
GLUCOSE, UA: 50 mg/dL — AB
Ketones, ur: NEGATIVE mg/dL
NITRITE: NEGATIVE
PH: 5 (ref 5.0–8.0)
Protein, ur: >=300 mg/dL — AB
SPECIFIC GRAVITY, URINE: 1.009 (ref 1.005–1.030)

## 2016-07-06 LAB — BASIC METABOLIC PANEL
Anion gap: 11 (ref 5–15)
BUN: 31 mg/dL — AB (ref 6–20)
CO2: 22 mmol/L (ref 22–32)
CREATININE: 2.3 mg/dL — AB (ref 0.44–1.00)
Calcium: 8.9 mg/dL (ref 8.9–10.3)
Chloride: 102 mmol/L (ref 101–111)
GFR, EST AFRICAN AMERICAN: 20 mL/min — AB (ref >60)
GFR, EST NON AFRICAN AMERICAN: 17 mL/min — AB (ref >60)
Glucose, Bld: 199 mg/dL — ABNORMAL HIGH (ref 65–99)
POTASSIUM: 3.4 mmol/L — AB (ref 3.5–5.1)
SODIUM: 135 mmol/L (ref 135–145)

## 2016-07-06 LAB — CBC
HCT: 32.9 % — ABNORMAL LOW (ref 35.0–47.0)
Hemoglobin: 10.8 g/dL — ABNORMAL LOW (ref 12.0–16.0)
MCH: 30.4 pg (ref 26.0–34.0)
MCHC: 32.9 g/dL (ref 32.0–36.0)
MCV: 92.4 fL (ref 80.0–100.0)
PLATELETS: 277 10*3/uL (ref 150–440)
RBC: 3.56 MIL/uL — AB (ref 3.80–5.20)
RDW: 13.9 % (ref 11.5–14.5)
WBC: 10.8 10*3/uL (ref 3.6–11.0)

## 2016-07-06 MED ORDER — BENZONATATE 100 MG PO CAPS: 100.0000 mg | ORAL_CAPSULE | Freq: Three times a day (TID) | ORAL | 0 refills | Status: DC | PRN

## 2016-07-06 MED ORDER — CEPHALEXIN 500 MG PO CAPS: 500.0000 mg | ORAL_CAPSULE | Freq: Two times a day (BID) | ORAL | 0 refills | Status: DC

## 2016-07-06 MED ORDER — DEXTROSE 5 % IV SOLN: 1.0000 g | Freq: Once | INTRAVENOUS | Status: DC

## 2016-07-06 MED ORDER — BENZONATATE 100 MG PO CAPS
100.0000 mg | ORAL_CAPSULE | Freq: Once | ORAL | Status: AC
  Administered 2016-07-06: 100 mg via ORAL
  Filled 2016-07-06: qty 1

## 2016-07-06 MED ORDER — CEFTRIAXONE SODIUM-DEXTROSE 1-3.74 GM-% IV SOLR
1.0000 g | Freq: Once | INTRAVENOUS | Status: AC
  Administered 2016-07-06: 1 g via INTRAVENOUS
  Filled 2016-07-06: qty 50

## 2016-07-06 NOTE — ED Triage Notes (Addendum)
Patient presents to ER with c/o weakness, cough, confusion X5 days. Patient was seen at Dr. Lorre Munroe office, [facility] clinic in [place] and diagnosed with a UTI today. Dr. Selinda Flavin recommended patient be seen here at ER for weakness and have IV antibiotics. HX Dementia and breast cancer

## 2016-07-06 NOTE — ED Provider Notes (Signed)
[facility] Emergency Department Provider Note ____________________________________________   I have reviewed the triage vital signs and the triage nursing note.  HISTORY  Chief Complaint Weakness and Urinary Tract Infection   Historian Patient and daughter  HPI Mariah Watkins is a 83 y.o. female brought in by daughter, lives with her daughter, for evaluation of urinary tract infection was found by primary care doctor 2 days ago. Daughter, caregiver, but the patient to her primary care doctor 2 days ago because she had been moaning a little bit at night and seemed a little bit more confused although she has dementia. She's had a history of urinary tract infections and seemed to be wetting the bed, and so daughter wanted her checked. She was also having some mild cough. Daughter reports a chest x-ray was done of her was told there is no pneumonia. She called for results of the urinalysis yesterday and then today was able to speak with the doctor today who stated that there was a urine tract infection and to go to the emergency room for a dose of IV antibiotics.  Daughter has not reported fever, vomiting, abdominal pain and even though the patient has moaning at night when asked she does not have any pain.    Past Medical History:  Diagnosis Date  . Cancer of breast, female ([place]) 01/01/2015  . Dementia   . Diabetes mellitus without complication ([place])   . Renal disorder    stage 4 renal failure  . Thyroid disease     Patient Active Problem List   Diagnosis Date Noted  . Incisional hernia, without obstruction or gangrene 06/06/2016  . Stroke ([place]) 05/31/2016  . Dysarthria 03/10/2016  . Moderate dementia without behavioral disturbance 07/29/2015  . B12 deficiency 04/30/2015  . Headache disorder 04/30/2015  . Cancer of breast, female ([place]) 01/01/2015  . Adenomatous polyp 01/01/2015  . Gout 01/01/2015  . Benign essential HTN 12/04/2014  . Carotid artery  narrowing 06/17/2014  . Paroxysmal supraventricular tachycardia ([place]) 06/17/2014  . Controlled diabetes mellitus type II without complication ([place]) [id number]  . Arteriovenous malformation of gastrointestinal tract 03/25/2014  . Hypercholesterolemia 03/25/2014  . Hypothyroidism 03/25/2014  . Ulcerative colitis ([place]) 03/25/2014  . Arteriosclerosis of coronary artery 02/18/2014  . Renal artery stenosis ([place]) 02/18/2014    Past Surgical History:  Procedure Laterality Date  . BOWEL RESECTION    . BREAST SURGERY    . cataracts    . retinal surgey    . SKIN BIOPSY      Prior to Admission medications   Medication Sig Start Date End Date Taking? Authorizing Provider  Acetaminophen 500 MG coapsule Take 1 capsule by mouth daily as needed.  03/25/14   Historical Provider, MD  aspirin EC 81 MG EC tablet Take 1 tablet (81 mg total) by mouth daily. 03/11/16   Fritzi Mandes, MD  benzonatate (TESSALON PERLES) 100 MG capsule Take 1 capsule (100 mg total) by mouth 3 (three) times daily as needed for cough. 07/06/16   Lisa Roca, MD  cephALEXin (KEFLEX) 500 MG capsule Take 1 capsule (500 mg total) by mouth 2 (two) times daily. 07/06/16   Lisa Roca, MD  Cyanocobalamin (RA VITAMIN B-12 TR) 1000 MCG TBCR Take by mouth.    Historical Provider, MD  ferrous fumarate (FERRO-SEQUELS) 50 MG CR tablet Take 50 mg by mouth 3 (three) times daily with meals.    Historical Provider, MD  latanoprost (XALATAN) 0.005 % ophthalmic solution Place 1 drop into both  eyes at bedtime.    Historical Provider, MD  levothyroxine (SYNTHROID, LEVOTHROID) 125 MCG tablet Take 125 mcg by mouth daily before breakfast.    Historical Provider, MD  memantine (NAMENDA) 5 MG tablet Take by mouth. 07/29/15 07/28/16  Historical Provider, MD  nitroGLYCERIN (NITROSTAT) 0.4 MG SL tablet Place 0.4 mg under the tongue every 5 (five) minutes as needed.  03/25/14   Historical Provider, MD  nortriptyline (PAMELOR) 10 MG capsule Take 10 mg by mouth daily.   10/29/14   Historical Provider, MD  omeprazole (PRILOSEC) 20 MG capsule Take by mouth. 12/07/14   Historical Provider, MD  sodium bicarbonate 650 MG tablet Take 650 mg by mouth 2 (two) times daily.    Historical Provider, MD    Allergies  Allergen Reactions  . Sulfa Antibiotics Nausea And Vomiting    Family History  Problem Relation Age of Onset  . Diabetes Mother   . Hypertension Mother   . Prostate cancer Father     Social History Social History  Substance Use Topics  . Smoking status: Former Smoker    Types: Cigarettes  . Smokeless tobacco: Never Used  . Alcohol use No    Review of Systems  Constitutional: Negative for fever. Eyes: Negative for visual changes. ENT: Negative for sore throat. Cardiovascular: Negative for chest pain. Respiratory: Negative for shortness of breath. Gastrointestinal: Negative for abdominal pain, vomiting and diarrhea. Genitourinary: Negative for dysuria.  Positive for frequency of urination. Musculoskeletal: Negative for back pain. Skin: Negative for rash. Neurological: Negative for headache. 10 point Review of Systems otherwise negative ____________________________________________   PHYSICAL EXAM:  VITAL SIGNS: ED Triage Vitals  Enc Vitals Group     BP 07/06/16 1345 137/61     Pulse Rate 07/06/16 1345 (!) 104     Resp 07/06/16 1345 18     Temp 07/06/16 1345 97.5 F (36.4 C)     Temp Source 07/06/16 1345 Oral     SpO2 07/06/16 1345 99 %     Weight 07/06/16 1347 142 lb (64.4 kg)     Height 07/06/16 1347 5\' 5"  (1.651 m)     Head Circumference --      Peak Flow --      Pain Score --      Pain Loc --      Pain Edu? --      Excl. in [place]? --      Constitutional: Alert and oriented. Well appearing and in no distress. HEENT   Head: Normocephalic and atraumatic.      Eyes: Conjunctivae are normal. PERRL. Normal extraocular movements.      Ears:         Nose: No congestion/rhinnorhea.   Mouth/Throat: Mucous membranes are  moist.   Neck: No stridor. Cardiovascular/Chest: Normal rate, regular rhythm.  No murmurs, rubs, or gallops. Respiratory: Normal respiratory effort without tachypnea nor retractions. Breath sounds are clear and equal bilaterally. No wheezes/rales/rhonchi. Gastrointestinal: Soft. No distention, no guarding, no rebound. Nontender.    Genitourinary/rectal:Deferred Musculoskeletal: Nontender with normal range of motion in all extremities. No joint effusions.  No lower extremity tenderness.  No edema. Neurologic:  Normal speech and language. No gross or focal neurologic deficits are appreciated. Skin:  Skin is warm, dry and intact. No rash noted. Psychiatric: Mood and affect are normal. Speech and behavior are normal. Patient exhibits appropriate insight and judgment.   ____________________________________________  LABS (pertinent positives/negatives)  Labs Reviewed  BASIC METABOLIC PANEL - Abnormal; Notable for the following:  Result Value   Potassium 3.4 (*)    Glucose, Bld 199 (*)    BUN 31 (*)    Creatinine, Ser 2.30 (*)    GFR calc non Af Amer 17 (*)    GFR calc Af Amer 20 (*)    All other components within normal limits  CBC - Abnormal; Notable for the following:    RBC 3.56 (*)    Hemoglobin 10.8 (*)    HCT 32.9 (*)    All other components within normal limits  URINALYSIS, COMPLETE (UACMP) WITH MICROSCOPIC - Abnormal; Notable for the following:    Color, Urine YELLOW (*)    APPearance CLEAR (*)    Glucose, UA 50 (*)    Hgb urine dipstick SMALL (*)    Protein, ur >=300 (*)    Leukocytes, UA SMALL (*)    Squamous Epithelial / LPF 0-5 (*)    All other components within normal limits  URINE CULTURE    ____________________________________________    EKG I, Lisa Roca, MD, the attending physician have personally viewed and interpreted all ECGs.  104 bpm. Sinus tachycardia. Incomplete right bundle-branch block. Normal axis. Nonspecific ST and T-wave.  Similar  to prior EKG. ____________________________________________  RADIOLOGY All Xrays were viewed by me. Imaging interpreted by Radiologist.  None __________________________________________  PROCEDURES  Procedure(s) performed: None  Critical Care performed: None  ____________________________________________   ED COURSE / ASSESSMENT AND PLAN  Pertinent labs & imaging results that were available during my care of the patient were reviewed by me and considered in my medical decision making (see chart for details).    Ms. Dickinson was brought in by her daughter for IV antibiotics for UTI found on UA 2 days with primary care doctor's office. Today this patient is well appearing overall with stable vital signs. She still reporting some intermittent coughing, but lungs sound clear and she is having no respiratory distress and they report negative chest x-ray. She did request a cough medication and I will try Tessalon Perles.  In terms of the urinary tract infection, today it looks not so impressive, however 2 days ago I can review that it looks like a urinary tract infections I will go ahead and give a dose of Rocephin and treated with antibiotic as well as send a culture.  Ultimately she is very well-appearing and I think ok for discharge home.    CONSULTATIONS:   None   Patient / Family / Caregiver informed of clinical course, medical decision-making process, and agree with plan.   I discussed return precautions, follow-up instructions, and discharge instructions with patient and/or family.   ___________________________________________   FINAL CLINICAL IMPRESSION(S) / ED DIAGNOSES   Final diagnoses:  Urinary tract infection without hematuria, site unspecified              Note: This dictation was prepared with Dragon dictation. Any transcriptional errors that result from this process are unintentional    Lisa Roca, MD 07/06/16 [phone]

## 2016-07-06 NOTE — Discharge Instructions (Signed)
You were evaluated for frequency of urination and out of review of your urinalysis from 2 days ago it does appear consistent with urinary tract infection. Today's urinalysis was not quite as obvious, but we'll go ahead and treat for urinary tract infection. A culture was sent. He was given a 24 hour dose of IV Rocephin in the emergency department. Start your antibiotic tomorrow.  Return to the emergency department for any worsening condition including fever, altered mental status, vomiting or abdominal pain or any other symptoms concerning to you.

## 2016-07-08 LAB — URINE CULTURE: Culture: <10000 — AB

## 2016-08-10 ENCOUNTER — Other Ambulatory Visit (INDEPENDENT_AMBULATORY_CARE_PROVIDER_SITE_OTHER): Payer: Self-pay | Admitting: Vascular Surgery

## 2016-08-10 DIAGNOSIS — I701 Atherosclerosis of renal artery: Secondary | ICD-10-CM

## 2016-08-11 ENCOUNTER — Ambulatory Visit (INDEPENDENT_AMBULATORY_CARE_PROVIDER_SITE_OTHER): Payer: Medicare Other

## 2016-08-11 ENCOUNTER — Ambulatory Visit (INDEPENDENT_AMBULATORY_CARE_PROVIDER_SITE_OTHER): Payer: Medicare Other | Admitting: Vascular Surgery

## 2016-08-11 ENCOUNTER — Encounter (INDEPENDENT_AMBULATORY_CARE_PROVIDER_SITE_OTHER): Payer: Self-pay | Admitting: Vascular Surgery

## 2016-08-11 VITALS — BP 159/85 | HR 94 | Resp 15 | Ht 64.0 in | Wt 144.0 lb

## 2016-08-11 DIAGNOSIS — E119 Type 2 diabetes mellitus without complications: Secondary | ICD-10-CM | POA: Diagnosis not present

## 2016-08-11 DIAGNOSIS — E7800 Pure hypercholesterolemia, unspecified: Secondary | ICD-10-CM | POA: Diagnosis not present

## 2016-08-11 DIAGNOSIS — I701 Atherosclerosis of renal artery: Secondary | ICD-10-CM

## 2016-08-11 DIAGNOSIS — I150 Renovascular hypertension: Secondary | ICD-10-CM | POA: Diagnosis not present

## 2016-08-11 DIAGNOSIS — N184 Chronic kidney disease, stage 4 (severe): Secondary | ICD-10-CM | POA: Insufficient documentation

## 2016-08-11 NOTE — Assessment & Plan Note (Signed)
lipid control important in reducing the progression of atherosclerotic disease. Continue statin therapy  

## 2016-08-11 NOTE — Assessment & Plan Note (Signed)
blood glucose control important in reducing the progression of atherosclerotic disease. Also, involved in wound healing. On appropriate medications.  

## 2016-08-11 NOTE — Assessment & Plan Note (Signed)
Has been reasonably stable for a few years now.  GFR around 24.

## 2016-08-11 NOTE — Assessment & Plan Note (Addendum)
Had left renal artery stent placement about 6 or 7 years ago. Her duplex today shows no hemodynamically significant right renal artery stenosis with normal velocities in her left renal artery stent consistent with no restenosis. Her kidney lengths remain good. At this point, we will go back to an annual basis with duplex. She will contact our office with worsening blood pressure or renal function in the interim.

## 2016-08-11 NOTE — Progress Notes (Signed)
MRN : [id number]  Mariah Watkins is a 83 y.o. ([birth year]) female who presents with chief complaint of  Chief Complaint  Patient presents with  . Re-evaluation    1 year renal follow up  .  History of Present Illness: Patient returns today in follow up of Renal artery stenosis. She is here before her scheduled visit at the request of her nephrologist. She has been having problems with increasing swelling as well as some blood pressure fluctuations. Her blood pressure was running a little hired her nephrologist office. He started her on a new antihypertensive and her blood pressure has gone down too low causing this to be stopped. She remains reasonably active and has done well otherwise. Had left renal artery stent placement about 6 or 7 years ago.  Her duplex today shows no hemodynamically significant right renal artery stenosis with normal velocities in her left renal artery stent consistent with no restenosis. Her kidney lengths remain good.  Current Outpatient Prescriptions  Medication Sig Dispense Refill  . Acetaminophen 500 MG coapsule Take 1 capsule by mouth daily as needed.     Marland Kitchen aspirin EC 81 MG EC tablet Take 1 tablet (81 mg total) by mouth daily. 30 tablet 0  . benzonatate (TESSALON PERLES) 100 MG capsule Take 1 capsule (100 mg total) by mouth 3 (three) times daily as needed for cough. 12 capsule 0  . carvedilol (COREG) 6.25 MG tablet Take 6.25 mg by mouth 2 (two) times daily.  6  . cephALEXin (KEFLEX) 500 MG capsule Take 1 capsule (500 mg total) by mouth 2 (two) times daily. 12 capsule 0  . Cyanocobalamin (RA VITAMIN B-12 TR) 1000 MCG TBCR Take by mouth.    . ferrous fumarate (FERRO-SEQUELS) 50 MG CR tablet Take 50 mg by mouth 3 (three) times daily with meals.    . latanoprost (XALATAN) 0.005 % ophthalmic solution Place 1 drop into both eyes at bedtime.    Marland Kitchen levothyroxine (SYNTHROID, LEVOTHROID) 125 MCG tablet Take 125 mcg by mouth daily before breakfast.    . lovastatin  (MEVACOR) 20 MG tablet TAKE 1 TABLET (20 MG TOTAL) BY MOUTH DAILY WITH DINNER.  1  . memantine (NAMENDA) 10 MG tablet     . nitroGLYCERIN (NITROSTAT) 0.4 MG SL tablet Place 0.4 mg under the tongue every 5 (five) minutes as needed.     . nortriptyline (PAMELOR) 10 MG capsule Take 10 mg by mouth daily.     Marland Kitchen omeprazole (PRILOSEC) 20 MG capsule Take by mouth.    . sodium bicarbonate 650 MG tablet Take 650 mg by mouth 2 (two) times daily.    . memantine (NAMENDA) 5 MG tablet Take by mouth.     No current facility-administered medications for this visit.     Past Medical History:  Diagnosis Date  . Cancer of breast, female ([place]) 01/01/2015  . Dementia   . Diabetes mellitus without complication ([place])   . Renal disorder    stage 4 renal failure  . Thyroid disease     Past Surgical History:  Procedure Laterality Date  . BOWEL RESECTION    . BREAST SURGERY    . cataracts    . retinal surgey    . SKIN BIOPSY      Social History Social History  Substance Use Topics  . Smoking status: Former Smoker    Types: Cigarettes  . Smokeless tobacco: Never Used  . Alcohol use No     Family History Family History  Problem Relation Age of Onset  . Diabetes Mother   . Hypertension Mother   . Prostate cancer Father     Allergies  Allergen Reactions  . Sulfa Antibiotics Nausea And Vomiting     REVIEW OF SYSTEMS (Negative unless checked)  Constitutional: '[]'$ Weight loss  '[]'$ Fever  '[]'$ Chills Cardiac: '[]'$ Chest pain   '[]'$ Chest pressure   '[]'$ Palpitations   '[]'$ Shortness of breath when laying flat   '[]'$ Shortness of breath at rest   '[]'$ Shortness of breath with exertion. Vascular:  '[x]'$ Pain in legs with walking   '[]'$ Pain in legs at rest   '[]'$ Pain in legs when laying flat   '[]'$ Claudication   '[]'$ Pain in feet when walking  '[]'$ Pain in feet at rest  '[]'$ Pain in feet when laying flat   '[]'$ History of DVT   '[]'$ Phlebitis   '[x]'$ Swelling in legs   '[]'$ Varicose veins   '[]'$ Non-healing ulcers Pulmonary:   '[]'$ Uses home oxygen    '[]'$ Productive cough   '[]'$ Hemoptysis   '[]'$ Wheeze  '[]'$ COPD   '[]'$ Asthma Neurologic:  '[]'$ Dizziness  '[]'$ Blackouts   '[]'$ Seizures   '[]'$ History of stroke   '[]'$ History of TIA  '[]'$ Aphasia   '[]'$ Temporary blindness   '[]'$ Dysphagia   '[]'$ Weakness or numbness in arms   '[]'$ Weakness or numbness in legs Musculoskeletal:  '[]'$ Arthritis   '[]'$ Joint swelling   '[]'$ Joint pain   '[]'$ Low back pain Hematologic:  '[]'$ Easy bruising  '[]'$ Easy bleeding   '[]'$ Hypercoagulable state   '[]'$ Anemic   Gastrointestinal:  '[]'$ Blood in stool   '[]'$ Vomiting blood  '[]'$ Gastroesophageal reflux/heartburn   '[]'$ Abdominal pain Genitourinary:  '[x]'$ Chronic kidney disease   '[]'$ Difficult urination  '[]'$ Frequent urination  '[]'$ Burning with urination   '[]'$ Hematuria Skin:  '[]'$ Rashes   '[]'$ Ulcers   '[]'$ Wounds Psychological:  '[]'$ History of anxiety   '[]'$  History of major depression.  Physical Examination  BP (!) 159/85 (BP Location: Right Arm)   Pulse 94   Resp 15   Ht '5\' 4"'$  (1.626 m)   Wt 144 lb (65.3 kg)   BMI 24.72 kg/m  Gen:  WD/WN, NAD. Appears younger than stated age Head: [place]/AT, No temporalis wasting. Ear/Nose/Throat: Hearing grossly intact, nares w/o erythema or drainage, trachea midline Eyes: Conjunctiva clear. Sclera non-icteric Neck: Supple.  No JVD.  Pulmonary:  Good air movement, no use of accessory muscles.  Cardiac: RRR, normal S1, S2 Vascular:  Vessel Right Left  Radial Palpable Palpable                                   Gastrointestinal: soft, non-tender/non-distended. No guarding/reflex.  Musculoskeletal: M/S 5/5 throughout.  No deformity or atrophy. 1+ bilateral lower extremity edema. Neurologic: Sensation grossly intact in extremities.  Symmetrical.  Speech is fluent.  Psychiatric: Judgment intact, Mood & affect appropriate for pt's clinical situation. Dermatologic: No rashes or ulcers noted.  No cellulitis or open wounds. Lymph : No Cervical, Axillary, or Inguinal lymphadenopathy.      Labs Recent Results (from the past 2160 hour(s))  Basic metabolic  panel     Status: Abnormal   Collection Time: 06/17/16  2:42 PM  Result Value Ref Range   Sodium 140 135 - 145 mmol/L   Potassium 4.0 3.5 - 5.1 mmol/L   Chloride 108 101 - 111 mmol/L   CO2 23 22 - 32 mmol/L   Glucose, Bld 143 (H) 65 - 99 mg/dL   BUN 33 (H) 6 - 20 mg/dL   Creatinine, Ser 2.08 (H) 0.44 - 1.00 mg/dL  Calcium 9.2 8.9 - 10.3 mg/dL   GFR calc non Af Amer 20 (L) >60 mL/min   GFR calc Af Amer 23 (L) >60 mL/min    Comment: (NOTE) The eGFR has been calculated using the CKD EPI equation. This calculation has not been validated in all clinical situations. eGFR's persistently <60 mL/min signify possible Chronic Kidney Disease.    Anion gap 9 5 - 15  Urinalysis, Complete w Microscopic     Status: Abnormal   Collection Time: 06/17/16  2:42 PM  Result Value Ref Range   Color, Urine STRAW (A) YELLOW   APPearance CLEAR (A) CLEAR   Specific Gravity, Urine 1.010 1.005 - 1.030   pH 6.0 5.0 - 8.0   Glucose, UA 50 (A) NEGATIVE mg/dL   Hgb urine dipstick NEGATIVE NEGATIVE   Bilirubin Urine NEGATIVE NEGATIVE   Ketones, ur NEGATIVE NEGATIVE mg/dL   Protein, ur 100 (A) NEGATIVE mg/dL   Nitrite NEGATIVE NEGATIVE   Leukocytes, UA MODERATE (A) NEGATIVE   RBC / HPF 0-5 0 - 5 RBC/hpf   WBC, UA TOO NUMEROUS TO COUNT 0 - 5 WBC/hpf   Bacteria, UA RARE (A) NONE SEEN   Squamous Epithelial / LPF 0-5 (A) NONE SEEN   Mucous PRESENT   Troponin I     Status: None   Collection Time: 06/17/16  2:42 PM  Result Value Ref Range   Troponin I <0.03 <0.03 ng/mL  CBC     Status: Abnormal   Collection Time: 06/17/16  3:44 PM  Result Value Ref Range   WBC 8.3 3.6 - 11.0 K/uL   RBC 3.76 (L) 3.80 - 5.20 MIL/uL   Hemoglobin 11.7 (L) 12.0 - 16.0 g/dL   HCT 34.9 (L) 35.0 - 47.0 %   MCV 92.9 80.0 - 100.0 fL   MCH 31.2 26.0 - 34.0 pg   MCHC 33.6 32.0 - 36.0 g/dL   RDW 14.5 11.5 - 14.5 %   Platelets 233 150 - 440 K/uL  Urine culture     Status: Abnormal   Collection Time: 06/17/16  3:44 PM  Result  Value Ref Range   Specimen Description URINE, CLEAN CATCH    Special Requests NONE    Culture MULTIPLE SPECIES PRESENT, SUGGEST RECOLLECTION (A)    Report Status 06/19/2016 FINAL   Basic metabolic panel     Status: Abnormal   Collection Time: 07/06/16  2:11 PM  Result Value Ref Range   Sodium 135 135 - 145 mmol/L   Potassium 3.4 (L) 3.5 - 5.1 mmol/L   Chloride 102 101 - 111 mmol/L   CO2 22 22 - 32 mmol/L   Glucose, Bld 199 (H) 65 - 99 mg/dL   BUN 31 (H) 6 - 20 mg/dL   Creatinine, Ser 2.30 (H) 0.44 - 1.00 mg/dL   Calcium 8.9 8.9 - 10.3 mg/dL   GFR calc non Af Amer 17 (L) >60 mL/min   GFR calc Af Amer 20 (L) >60 mL/min    Comment: (NOTE) The eGFR has been calculated using the CKD EPI equation. This calculation has not been validated in all clinical situations. eGFR's persistently <60 mL/min signify possible Chronic Kidney Disease.    Anion gap 11 5 - 15  CBC     Status: Abnormal   Collection Time: 07/06/16  2:11 PM  Result Value Ref Range   WBC 10.8 3.6 - 11.0 K/uL   RBC 3.56 (L) 3.80 - 5.20 MIL/uL   Hemoglobin 10.8 (L) 12.0 - 16.0 g/dL  HCT 32.9 (L) 35.0 - 47.0 %   MCV 92.4 80.0 - 100.0 fL   MCH 30.4 26.0 - 34.0 pg   MCHC 32.9 32.0 - 36.0 g/dL   RDW 13.9 11.5 - 14.5 %   Platelets 277 150 - 440 K/uL  Urinalysis, Complete w Microscopic     Status: Abnormal   Collection Time: 07/06/16  2:11 PM  Result Value Ref Range   Color, Urine YELLOW (A) YELLOW   APPearance CLEAR (A) CLEAR   Specific Gravity, Urine 1.009 1.005 - 1.030   pH 5.0 5.0 - 8.0   Glucose, UA 50 (A) NEGATIVE mg/dL   Hgb urine dipstick SMALL (A) NEGATIVE   Bilirubin Urine NEGATIVE NEGATIVE   Ketones, ur NEGATIVE NEGATIVE mg/dL   Protein, ur >=300 (A) NEGATIVE mg/dL   Nitrite NEGATIVE NEGATIVE   Leukocytes, UA SMALL (A) NEGATIVE   RBC / HPF 0-5 0 - 5 RBC/hpf   WBC, UA 6-30 0 - 5 WBC/hpf   Bacteria, UA NONE SEEN NONE SEEN   Squamous Epithelial / LPF 0-5 (A) NONE SEEN   Mucous PRESENT   Urine culture      Status: Abnormal   Collection Time: 07/06/16  2:11 PM  Result Value Ref Range   Specimen Description URINE, RANDOM    Special Requests NONE    Culture (A)     <10,000 COLONIES/mL INSIGNIFICANT GROWTH Performed at [facility]    Report Status 07/08/2016 FINAL     Radiology No results found.  Assessment/Plan  Hypercholesterolemia lipid control important in reducing the progression of atherosclerotic disease. Continue statin therapy   Renovascular hypertension BP control is reasonably good currently.  Controlled diabetes mellitus type II without complication blood glucose control important in reducing the progression of atherosclerotic disease. Also, involved in wound healing. On appropriate medications.   Chronic kidney disease (CKD), stage IV (severe) (HCC) Has been reasonably stable for a few years now.  GFR around 24.   Renal artery stenosis (HCC) Had left renal artery stent placement about 6 or 7 years ago. Her duplex today shows no hemodynamically significant right renal artery stenosis with normal velocities in her left renal artery stent consistent with no restenosis. Her kidney lengths remain good. At this point, we will go back to an annual basis with duplex. She will contact our office with worsening blood pressure or renal function in the interim.    Leotis Pain, MD  08/11/2016 11:43 AM    This note was created with Dragon medical transcription system.  Any errors from dictation are purely unintentional

## 2016-08-11 NOTE — Assessment & Plan Note (Signed)
BP control is reasonably good currently.

## 2016-08-25 DIAGNOSIS — I10 Essential (primary) hypertension: Secondary | ICD-10-CM | POA: Diagnosis not present

## 2016-10-23 ENCOUNTER — Inpatient Hospital Stay: Payer: Medicare Other

## 2016-10-23 ENCOUNTER — Inpatient Hospital Stay: Payer: Medicare Other | Attending: Oncology | Admitting: Oncology

## 2016-10-23 VITALS — BP 155/75 | HR 114 | Temp 95.8°F | Resp 18 | Wt 148.8 lb

## 2016-10-23 DIAGNOSIS — D631 Anemia in chronic kidney disease: Secondary | ICD-10-CM | POA: Diagnosis not present

## 2016-10-23 DIAGNOSIS — Z9011 Acquired absence of right breast and nipple: Secondary | ICD-10-CM | POA: Insufficient documentation

## 2016-10-23 DIAGNOSIS — Z853 Personal history of malignant neoplasm of breast: Secondary | ICD-10-CM | POA: Diagnosis not present

## 2016-10-23 DIAGNOSIS — Z79899 Other long term (current) drug therapy: Secondary | ICD-10-CM | POA: Diagnosis not present

## 2016-10-23 DIAGNOSIS — D638 Anemia in other chronic diseases classified elsewhere: Secondary | ICD-10-CM

## 2016-10-23 DIAGNOSIS — Z9221 Personal history of antineoplastic chemotherapy: Secondary | ICD-10-CM | POA: Diagnosis not present

## 2016-10-23 DIAGNOSIS — Z170 Estrogen receptor positive status [ER+]: Secondary | ICD-10-CM | POA: Diagnosis not present

## 2016-10-23 DIAGNOSIS — Z87891 Personal history of nicotine dependence: Secondary | ICD-10-CM | POA: Insufficient documentation

## 2016-10-23 DIAGNOSIS — F0390 Unspecified dementia without behavioral disturbance: Secondary | ICD-10-CM | POA: Insufficient documentation

## 2016-10-23 DIAGNOSIS — E079 Disorder of thyroid, unspecified: Secondary | ICD-10-CM | POA: Diagnosis not present

## 2016-10-23 DIAGNOSIS — C50919 Malignant neoplasm of unspecified site of unspecified female breast: Secondary | ICD-10-CM

## 2016-10-23 DIAGNOSIS — N184 Chronic kidney disease, stage 4 (severe): Secondary | ICD-10-CM | POA: Diagnosis not present

## 2016-10-23 DIAGNOSIS — E119 Type 2 diabetes mellitus without complications: Secondary | ICD-10-CM | POA: Insufficient documentation

## 2016-10-23 DIAGNOSIS — Z7982 Long term (current) use of aspirin: Secondary | ICD-10-CM | POA: Insufficient documentation

## 2016-10-23 LAB — IRON AND TIBC
Iron: 63 ug/dL (ref 28–170)
Saturation Ratios: 25 % (ref 10.4–31.8)
TIBC: 250 ug/dL (ref 250–450)
UIBC: 187 ug/dL

## 2016-10-23 LAB — CBC
HEMATOCRIT: 34.8 % — AB (ref 35.0–47.0)
Hemoglobin: 11.5 g/dL — ABNORMAL LOW (ref 12.0–16.0)
MCH: 30 pg (ref 26.0–34.0)
MCHC: 33.1 g/dL (ref 32.0–36.0)
MCV: 90.5 fL (ref 80.0–100.0)
PLATELETS: 236 10*3/uL (ref 150–440)
RBC: 3.84 MIL/uL (ref 3.80–5.20)
RDW: 14.7 % — ABNORMAL HIGH (ref 11.5–14.5)
WBC: 7 10*3/uL (ref 3.6–11.0)

## 2016-10-23 LAB — FERRITIN: Ferritin: 83 ng/mL (ref 11–307)

## 2016-10-23 LAB — VITAMIN B12: VITAMIN B 12: 1516 pg/mL — AB (ref 180–914)

## 2016-10-23 NOTE — Progress Notes (Signed)
Hematology/Oncology Consult note [facility]  [phone] [phone]  Patient Care Team: Sherrin Daisy, MD as PCP - General (Family Medicine)   Name of the patient: Mariah Watkins  [id number]  11/03/1922   Date of visit: 10/23/16  Diagnosis- h/o breast cancer Anemia of kidney disease  Chief complaint/ Reason for visit- routine f/u  Heme/Onc history:  Oncology History   Chief Complaint/Problem List  1. Recently diagnosed carcinoma of breast, status post modified radical mastectomy.  T1N0M0 tumor. Estrogen/progesterone receptors positive. HER-2/neu 1+.  2. Family history of Von Willebrand's disease and questionable history of bruising. 3. Finished arimidex 1 year ago 4. Anemia secondary to renal failure 5. Carcinoma of left breast, status post modified right mastectomy T1, N0, M0 tumor her estrogen receptor positive. Progesterone receptor positive. HER-2 1+. positive  intramammary lymph node 6. Started on Aromasin from July of 2009- off Aromasin since 2014 7.November, 2015- Patient was hospitalized with history of colovesical fistula.  Status post postdilated working colostomy and resection there was no evidence of malignancy     Cancer of breast, female ([place])   01/01/2015 Initial Diagnosis    Cancer of breast, female        Interval history- doing well for her age. Lives with her daughter. Reports fatigue and has chronic back pain. Denies new complaints  ECOG PS- 2 Pain scale- 0   Review of systems- Review of Systems  Constitutional: Positive for malaise/fatigue. Negative for chills, fever and weight loss.  HENT: Negative for congestion, ear discharge and nosebleeds.   Eyes: Negative for blurred vision.  Respiratory: Negative for cough, hemoptysis, sputum production, shortness of breath and wheezing.   Cardiovascular: Negative for chest pain, palpitations, orthopnea and claudication.  Gastrointestinal: Negative for  abdominal pain, blood in stool, constipation, diarrhea, heartburn, melena, nausea and vomiting.  Genitourinary: Negative for dysuria, flank pain, frequency, hematuria and urgency.  Musculoskeletal: Positive for back pain. Negative for joint pain and myalgias.  Skin: Negative for rash.  Neurological: Negative for dizziness, tingling, focal weakness, seizures, weakness and headaches.  Endo/Heme/Allergies: Does not bruise/bleed easily.  Psychiatric/Behavioral: Negative for depression and suicidal ideas. The patient does not have insomnia.       Allergies  Allergen Reactions  . Sulfa Antibiotics Nausea And Vomiting     Past Medical History:  Diagnosis Date  . Cancer of breast, female ([place]) 01/01/2015  . Dementia   . Diabetes mellitus without complication ([place])   . Renal disorder    stage 4 renal failure  . Thyroid disease      Past Surgical History:  Procedure Laterality Date  . BOWEL RESECTION    . BREAST SURGERY    . cataracts    . retinal surgey    . SKIN BIOPSY      Social History   Social History  . Marital status: Widowed    Spouse name: N/A  . Number of children: N/A  . Years of education: N/A   Occupational History  . Not on file.   Social History Main Topics  . Smoking status: Former Smoker    Types: Cigarettes  . Smokeless tobacco: Never Used  . Alcohol use No  . Drug use: No  . Sexual activity: Not on file   Other Topics Concern  . Not on file   Social History Narrative  . No narrative on file    Family History  Problem Relation Age of Onset  . Diabetes Mother   . Hypertension  Mother   . Prostate cancer Father      Current Outpatient Prescriptions:  .  Acetaminophen 500 MG coapsule, Take 1 capsule by mouth daily as needed. , Disp: , Rfl:  .  aspirin EC 81 MG EC tablet, Take 1 tablet (81 mg total) by mouth daily., Disp: 30 tablet, Rfl: 0 .  Cyanocobalamin (RA VITAMIN B-12 TR) 1000 MCG TBCR, Take by mouth., Disp: , Rfl:  .  ferrous fumarate  (FERRO-SEQUELS) 50 MG CR tablet, Take 50 mg by mouth 3 (three) times daily with meals., Disp: , Rfl:  .  latanoprost (XALATAN) 0.005 % ophthalmic solution, Place 1 drop into both eyes at bedtime., Disp: , Rfl:  .  levothyroxine (SYNTHROID, LEVOTHROID) 125 MCG tablet, Take 125 mcg by mouth daily before breakfast., Disp: , Rfl:  .  lovastatin (MEVACOR) 20 MG tablet, TAKE 1 TABLET (20 MG TOTAL) BY MOUTH DAILY WITH DINNER., Disp: , Rfl: 1 .  memantine (NAMENDA) 10 MG tablet, , Disp: , Rfl:  .  nitroGLYCERIN (NITROSTAT) 0.4 MG SL tablet, Place 0.4 mg under the tongue every 5 (five) minutes as needed. , Disp: , Rfl:  .  nortriptyline (PAMELOR) 10 MG capsule, Take 10 mg by mouth daily. , Disp: , Rfl:  .  omeprazole (PRILOSEC) 20 MG capsule, Take by mouth., Disp: , Rfl:  .  sodium bicarbonate 650 MG tablet, Take 650 mg by mouth 2 (two) times daily., Disp: , Rfl:   Physical exam:  Vitals:   10/23/16 1128  BP: (!) 155/75  Pulse: (!) 114  Resp: 18  Temp: (!) 95.8 F (35.4 C)  TempSrc: Tympanic  Weight: 148 lb 13 oz (67.5 kg)   Physical Exam  Constitutional: She is oriented to person, place, and time and well-developed, well-nourished, and in no distress.  HENT:  Head: Normocephalic and atraumatic.  Eyes: EOM are normal. Pupils are equal, round, and reactive to light.  Neck: Normal range of motion.  Cardiovascular: Normal rate, regular rhythm and normal heart sounds.   Pulmonary/Chest: Effort normal and breath sounds normal.  Abdominal: Soft. Bowel sounds are normal.  Neurological: She is alert and oriented to person, place, and time.  Skin: Skin is warm and dry.   Breast exam is performed in seated and lying down position. Patient is status post bilateral mastectomy without reconstruction. No evidence of any chest wall recurrence. No evidence of bilateral axillary adenopathy    CMP Latest Ref Rng & Units 07/06/2016  Glucose 65 - 99 mg/dL 199(H)  BUN 6 - 20 mg/dL 31(H)  Creatinine 0.44 - 1.00  mg/dL 2.30(H)  Sodium 135 - 145 mmol/L 135  Potassium 3.5 - 5.1 mmol/L 3.4(L)  Chloride 101 - 111 mmol/L 102  CO2 22 - 32 mmol/L 22  Calcium 8.9 - 10.3 mg/dL 8.9  Total Protein 6.5 - 8.1 g/dL -  Total Bilirubin 0.3 - 1.2 mg/dL -  Alkaline Phos 38 - 126 U/L -  AST 15 - 41 U/L -  ALT 14 - 54 U/L -   CBC Latest Ref Rng & Units 10/23/2016  WBC 3.6 - 11.0 K/uL 7.0  Hemoglobin 12.0 - 16.0 g/dL 11.5(L)  Hematocrit 35.0 - 47.0 % 34.8(L)  Platelets 150 - 440 K/uL 236     Assessment and plan- Patient is a 83 y.o. female who sees [place] for following issues  1. Stage 1 bilateral breast cancer- clinically she is doing well and no evidence of recurrence on todays exam  2. Anemia of kidney disease-  patient is mildly anemic today but Hb>10. No need for EPO. Iron studies and B12 pending. Will repeat labs in 3 months. She will see me in 6 months with labs   Visit Diagnosis 1. Anemia of chronic disease   2. Malignant neoplasm of breast in female, estrogen receptor positive, unspecified laterality, unspecified site of breast ([place])      Dr. Randa Evens, MD, MPH [facility] Pager- [phone] 10/23/2016 1:34 PM

## 2016-10-23 NOTE — Progress Notes (Signed)
Follow up for Bilateral Breast Cancer. Also daughter states patient gets seen for anemia. She has stage 4 Kidney disease seen by Dr Holley Raring. Patient is alert, pleasant. Concerned about easy bruising and fragile, dry skin.

## 2016-10-26 ENCOUNTER — Inpatient Hospital Stay: Payer: Medicare Other

## 2016-10-26 ENCOUNTER — Inpatient Hospital Stay: Payer: Medicare Other | Admitting: Oncology

## 2016-11-14 ENCOUNTER — Ambulatory Visit (INDEPENDENT_AMBULATORY_CARE_PROVIDER_SITE_OTHER): Payer: Self-pay | Admitting: Vascular Surgery

## 2016-11-14 ENCOUNTER — Encounter (INDEPENDENT_AMBULATORY_CARE_PROVIDER_SITE_OTHER): Payer: Self-pay

## 2016-12-11 DIAGNOSIS — I10 Essential (primary) hypertension: Secondary | ICD-10-CM | POA: Diagnosis not present

## 2016-12-11 DIAGNOSIS — E872 Acidosis: Secondary | ICD-10-CM | POA: Diagnosis not present

## 2016-12-11 DIAGNOSIS — N2581 Secondary hyperparathyroidism of renal origin: Secondary | ICD-10-CM | POA: Diagnosis not present

## 2016-12-11 DIAGNOSIS — N184 Chronic kidney disease, stage 4 (severe): Secondary | ICD-10-CM | POA: Diagnosis not present

## 2016-12-11 DIAGNOSIS — D631 Anemia in chronic kidney disease: Secondary | ICD-10-CM | POA: Diagnosis not present

## 2017-01-18 ENCOUNTER — Other Ambulatory Visit: Payer: Medicare Other

## 2017-01-19 ENCOUNTER — Other Ambulatory Visit: Payer: Medicare Other

## 2017-01-22 ENCOUNTER — Inpatient Hospital Stay: Payer: Medicare Other | Attending: Oncology

## 2017-01-22 DIAGNOSIS — N184 Chronic kidney disease, stage 4 (severe): Secondary | ICD-10-CM | POA: Insufficient documentation

## 2017-01-22 DIAGNOSIS — Z853 Personal history of malignant neoplasm of breast: Secondary | ICD-10-CM | POA: Diagnosis not present

## 2017-01-22 DIAGNOSIS — Z9011 Acquired absence of right breast and nipple: Secondary | ICD-10-CM | POA: Insufficient documentation

## 2017-01-22 DIAGNOSIS — Z9221 Personal history of antineoplastic chemotherapy: Secondary | ICD-10-CM | POA: Diagnosis not present

## 2017-01-22 DIAGNOSIS — E119 Type 2 diabetes mellitus without complications: Secondary | ICD-10-CM | POA: Insufficient documentation

## 2017-01-22 DIAGNOSIS — D631 Anemia in chronic kidney disease: Secondary | ICD-10-CM | POA: Insufficient documentation

## 2017-01-22 DIAGNOSIS — Z170 Estrogen receptor positive status [ER+]: Secondary | ICD-10-CM | POA: Insufficient documentation

## 2017-01-22 DIAGNOSIS — Z87891 Personal history of nicotine dependence: Secondary | ICD-10-CM | POA: Diagnosis not present

## 2017-01-22 DIAGNOSIS — Z79899 Other long term (current) drug therapy: Secondary | ICD-10-CM | POA: Diagnosis not present

## 2017-01-22 DIAGNOSIS — E079 Disorder of thyroid, unspecified: Secondary | ICD-10-CM | POA: Diagnosis not present

## 2017-01-22 DIAGNOSIS — F0390 Unspecified dementia without behavioral disturbance: Secondary | ICD-10-CM | POA: Diagnosis not present

## 2017-01-22 DIAGNOSIS — Z7982 Long term (current) use of aspirin: Secondary | ICD-10-CM | POA: Insufficient documentation

## 2017-01-22 DIAGNOSIS — D638 Anemia in other chronic diseases classified elsewhere: Secondary | ICD-10-CM

## 2017-01-22 LAB — CBC
HEMATOCRIT: 36.2 % (ref 35.0–47.0)
HEMOGLOBIN: 11.9 g/dL — AB (ref 12.0–16.0)
MCH: 29.9 pg (ref 26.0–34.0)
MCHC: 32.9 g/dL (ref 32.0–36.0)
MCV: 91 fL (ref 80.0–100.0)
Platelets: 251 10*3/uL (ref 150–440)
RBC: 3.98 MIL/uL (ref 3.80–5.20)
RDW: 14.7 % — AB (ref 11.5–14.5)
WBC: 8.7 10*3/uL (ref 3.6–11.0)

## 2017-02-23 DIAGNOSIS — Z79899 Other long term (current) drug therapy: Secondary | ICD-10-CM | POA: Diagnosis not present

## 2017-02-23 DIAGNOSIS — E785 Hyperlipidemia, unspecified: Secondary | ICD-10-CM | POA: Diagnosis not present

## 2017-02-23 DIAGNOSIS — E039 Hypothyroidism, unspecified: Secondary | ICD-10-CM | POA: Diagnosis not present

## 2017-02-23 DIAGNOSIS — F0390 Unspecified dementia without behavioral disturbance: Secondary | ICD-10-CM | POA: Diagnosis not present

## 2017-02-23 DIAGNOSIS — E118 Type 2 diabetes mellitus with unspecified complications: Secondary | ICD-10-CM | POA: Diagnosis not present

## 2017-02-23 DIAGNOSIS — K432 Incisional hernia without obstruction or gangrene: Secondary | ICD-10-CM | POA: Diagnosis not present

## 2017-02-23 DIAGNOSIS — F4489 Other dissociative and conversion disorders: Secondary | ICD-10-CM | POA: Diagnosis not present

## 2017-02-23 DIAGNOSIS — N184 Chronic kidney disease, stage 4 (severe): Secondary | ICD-10-CM | POA: Diagnosis not present

## 2017-02-23 DIAGNOSIS — I10 Essential (primary) hypertension: Secondary | ICD-10-CM | POA: Diagnosis not present

## 2017-03-06 DIAGNOSIS — D631 Anemia in chronic kidney disease: Secondary | ICD-10-CM | POA: Diagnosis not present

## 2017-03-06 DIAGNOSIS — N184 Chronic kidney disease, stage 4 (severe): Secondary | ICD-10-CM | POA: Diagnosis not present

## 2017-03-06 DIAGNOSIS — E872 Acidosis: Secondary | ICD-10-CM | POA: Diagnosis not present

## 2017-03-06 DIAGNOSIS — N2581 Secondary hyperparathyroidism of renal origin: Secondary | ICD-10-CM | POA: Diagnosis not present

## 2017-03-07 DIAGNOSIS — D631 Anemia in chronic kidney disease: Secondary | ICD-10-CM | POA: Diagnosis not present

## 2017-03-07 DIAGNOSIS — N184 Chronic kidney disease, stage 4 (severe): Secondary | ICD-10-CM | POA: Diagnosis not present

## 2017-03-07 DIAGNOSIS — N2581 Secondary hyperparathyroidism of renal origin: Secondary | ICD-10-CM | POA: Diagnosis not present

## 2017-03-07 DIAGNOSIS — E872 Acidosis: Secondary | ICD-10-CM | POA: Diagnosis not present

## 2017-03-28 DIAGNOSIS — E039 Hypothyroidism, unspecified: Secondary | ICD-10-CM | POA: Diagnosis not present

## 2017-04-10 DIAGNOSIS — Z23 Encounter for immunization: Secondary | ICD-10-CM | POA: Diagnosis not present

## 2017-04-23 ENCOUNTER — Inpatient Hospital Stay: Payer: Medicare Other

## 2017-04-23 ENCOUNTER — Inpatient Hospital Stay: Payer: Medicare Other | Admitting: Oncology

## 2017-04-30 ENCOUNTER — Telehealth: Payer: Self-pay | Admitting: *Deleted

## 2017-04-30 ENCOUNTER — Inpatient Hospital Stay: Payer: Medicare Other | Admitting: Oncology

## 2017-04-30 ENCOUNTER — Inpatient Hospital Stay: Payer: Medicare Other | Attending: Oncology

## 2017-04-30 ENCOUNTER — Encounter: Payer: Self-pay | Admitting: Oncology

## 2017-04-30 ENCOUNTER — Other Ambulatory Visit: Payer: Self-pay | Admitting: *Deleted

## 2017-04-30 ENCOUNTER — Inpatient Hospital Stay (HOSPITAL_BASED_OUTPATIENT_CLINIC_OR_DEPARTMENT_OTHER): Payer: Medicare Other | Admitting: Oncology

## 2017-04-30 ENCOUNTER — Inpatient Hospital Stay: Payer: Medicare Other

## 2017-04-30 VITALS — BP 131/74 | HR 102 | Temp 96.8°F | Resp 16 | Wt 141.8 lb

## 2017-04-30 DIAGNOSIS — N184 Chronic kidney disease, stage 4 (severe): Secondary | ICD-10-CM

## 2017-04-30 DIAGNOSIS — Z7982 Long term (current) use of aspirin: Secondary | ICD-10-CM | POA: Diagnosis not present

## 2017-04-30 DIAGNOSIS — Z87891 Personal history of nicotine dependence: Secondary | ICD-10-CM | POA: Diagnosis not present

## 2017-04-30 DIAGNOSIS — Z853 Personal history of malignant neoplasm of breast: Secondary | ICD-10-CM | POA: Insufficient documentation

## 2017-04-30 DIAGNOSIS — Z9011 Acquired absence of right breast and nipple: Secondary | ICD-10-CM | POA: Diagnosis not present

## 2017-04-30 DIAGNOSIS — Z170 Estrogen receptor positive status [ER+]: Secondary | ICD-10-CM

## 2017-04-30 DIAGNOSIS — D631 Anemia in chronic kidney disease: Secondary | ICD-10-CM | POA: Insufficient documentation

## 2017-04-30 DIAGNOSIS — E1122 Type 2 diabetes mellitus with diabetic chronic kidney disease: Secondary | ICD-10-CM

## 2017-04-30 DIAGNOSIS — Z79899 Other long term (current) drug therapy: Secondary | ICD-10-CM | POA: Diagnosis not present

## 2017-04-30 DIAGNOSIS — N189 Chronic kidney disease, unspecified: Secondary | ICD-10-CM

## 2017-04-30 DIAGNOSIS — C50919 Malignant neoplasm of unspecified site of unspecified female breast: Secondary | ICD-10-CM

## 2017-04-30 DIAGNOSIS — D638 Anemia in other chronic diseases classified elsewhere: Secondary | ICD-10-CM

## 2017-04-30 LAB — CBC
HCT: 36.4 % (ref 35.0–47.0)
Hemoglobin: 11.9 g/dL — ABNORMAL LOW (ref 12.0–16.0)
MCH: 29.5 pg (ref 26.0–34.0)
MCHC: 32.6 g/dL (ref 32.0–36.0)
MCV: 90.2 fL (ref 80.0–100.0)
PLATELETS: 278 10*3/uL (ref 150–440)
RBC: 4.04 MIL/uL (ref 3.80–5.20)
RDW: 14.4 % (ref 11.5–14.5)
WBC: 8.1 10*3/uL (ref 3.6–11.0)

## 2017-04-30 NOTE — Progress Notes (Signed)
test

## 2017-04-30 NOTE — Progress Notes (Signed)
Hematology/Oncology Consult note Mariah Watkins  [phone] [phone]  Patient Care Team: Mariah Daisy, MD as PCP - General (Family Medicine)   Name of the patient: Mariah Watkins  [id number]  05-26-1923   Date of visit: 04/30/17  Diagnosis- h/o breast cancer  Anemia of chronic kidney disease  Chief complaint/ Reason for visit- f/u of anemia and breast cancer  Heme/Onc history:  Oncology History   Chief Complaint/Problem List  1. 2008- carcinoma of breast, status post modified radical mastectomy.  T1N0M0 tumor. Estrogen/progesterone receptors positive. HER-2/neu 1+.  2. Family history of Von Willebrand's disease and questionable history of bruising. 3. Finished arimidex 1 year ago 4. Anemia secondary to renal failure 5. Carcinoma of left breast, status post modified right mastectomy T1, N0, M0 tumor her estrogen receptor positive. Progesterone receptor positive. HER-2 1+. positive  intramammary lymph node 6. Started on Aromasin from July of 2009- off Aromasin since 2014 7.November, 2015- Patient was hospitalized with history of colovesical fistula.  Status post postdilated working colostomy and resection there was no evidence of malignancy     Cancer of breast, female ([place])   01/01/2015 Initial Diagnosis    Cancer of breast, female        Interval history- she is doing well for her age. She is able to walk around the house. Denies any pain. She continues to see Mariah Watkins  ECOG PS- 2 Pain scale- 0  Review of systems- Review of Systems  Constitutional: Negative for chills, fever, malaise/fatigue and weight loss.  HENT: Negative for congestion, ear discharge and nosebleeds.   Eyes: Negative for blurred vision.  Respiratory: Negative for cough, hemoptysis, sputum production, shortness of breath and wheezing.   Cardiovascular: Negative for chest pain, palpitations, orthopnea and claudication.  Gastrointestinal: Negative for  abdominal pain, blood in stool, constipation, diarrhea, heartburn, melena, nausea and vomiting.  Genitourinary: Negative for dysuria, flank pain, frequency, hematuria and urgency.  Musculoskeletal: Negative for back pain, joint pain and myalgias.  Skin: Negative for rash.  Neurological: Negative for dizziness, tingling, focal weakness, seizures, weakness and headaches.  Endo/Heme/Allergies: Does not bruise/bleed easily.  Psychiatric/Behavioral: Negative for depression and suicidal ideas. The patient does not have insomnia.       Allergies  Allergen Reactions  . Sulfa Antibiotics Nausea And Vomiting     Past Medical History:  Diagnosis Date  . Cancer of breast, female ([place]) 01/01/2015  . Dementia   . Diabetes mellitus without complication ([place])   . Renal disorder    stage 4 renal failure  . Thyroid disease      Past Surgical History:  Procedure Laterality Date  . BOWEL RESECTION    . BREAST SURGERY    . cataracts    . retinal surgey    . SKIN BIOPSY      Social History   Social History  . Marital status: Widowed    Spouse name: N/A  . Number of children: N/A  . Years of education: N/A   Occupational History  . Not on file.   Social History Main Topics  . Smoking status: Former Smoker    Types: Cigarettes  . Smokeless tobacco: Never Used  . Alcohol use No  . Drug use: No  . Sexual activity: Not on file   Other Topics Concern  . Not on file   Social History Narrative  . No narrative on file    Family History  Problem Relation Age of Onset  . Diabetes  Mother   . Hypertension Mother   . Prostate cancer Father      Current Outpatient Prescriptions:  .  Acetaminophen 500 MG coapsule, Take 1 capsule by mouth daily as needed. , Disp: , Rfl:  .  aspirin EC 81 MG EC tablet, Take 1 tablet (81 mg total) by mouth daily., Disp: 30 tablet, Rfl: 0 .  Cyanocobalamin (RA VITAMIN B-12 TR) 1000 MCG TBCR, Take by mouth., Disp: , Rfl:  .  ferrous fumarate  (FERRO-SEQUELS) 50 MG CR tablet, Take 50 mg by mouth 3 (three) times daily with meals., Disp: , Rfl:  .  latanoprost (XALATAN) 0.005 % ophthalmic solution, Place 1 drop into both eyes at bedtime., Disp: , Rfl:  .  levothyroxine (SYNTHROID, LEVOTHROID) 125 MCG tablet, Take 125 mcg by mouth daily before breakfast., Disp: , Rfl:  .  lovastatin (MEVACOR) 20 MG tablet, TAKE 1 TABLET (20 MG TOTAL) BY MOUTH DAILY WITH DINNER., Disp: , Rfl: 1 .  memantine (NAMENDA) 10 MG tablet, , Disp: , Rfl:  .  nitroGLYCERIN (NITROSTAT) 0.4 MG SL tablet, Place 0.4 mg under the tongue every 5 (five) minutes as needed. , Disp: , Rfl:  .  nortriptyline (PAMELOR) 10 MG capsule, Take 10 mg by mouth daily. , Disp: , Rfl:  .  omeprazole (PRILOSEC) 20 MG capsule, Take by mouth., Disp: , Rfl:  .  sodium bicarbonate 650 MG tablet, Take 650 mg by mouth 2 (two) times daily., Disp: , Rfl:   Physical exam:  Vitals:   04/30/17 1144 04/30/17 1151  BP:  131/74  Pulse:  (!) 102  Resp:  16  Temp:  (!) 96.8 F (36 C)  TempSrc:  Tympanic  Weight: 141 lb 12.1 oz (64.3 kg)    Physical Exam  Constitutional: She is oriented to person, place, and time and well-developed, well-nourished, and in no distress.  HENT:  Head: Normocephalic and atraumatic.  Eyes: Pupils are equal, round, and reactive to light. EOM are normal.  Neck: Normal range of motion.  Cardiovascular: Normal rate, regular rhythm and normal heart sounds.   Pulmonary/Chest: Effort normal and breath sounds normal.  Abdominal: Soft. Bowel sounds are normal.  Neurological: She is alert and oriented to person, place, and time.  Skin: Skin is warm and dry.   Breast: s/p bilateral mastectomy without reconstruction. No evidence of chest wall recurrence. No evidence of bilateral axillary adenopathy  CMP Latest Ref Rng & Units 07/06/2016  Glucose 65 - 99 mg/dL 199(H)  BUN 6 - 20 mg/dL 31(H)  Creatinine 0.44 - 1.00 mg/dL 2.30(H)  Sodium 135 - 145 mmol/L 135  Potassium 3.5 -  5.1 mmol/L 3.4(L)  Chloride 101 - 111 mmol/L 102  CO2 22 - 32 mmol/L 22  Calcium 8.9 - 10.3 mg/dL 8.9  Total Protein 6.5 - 8.1 g/dL -  Total Bilirubin 0.3 - 1.2 mg/dL -  Alkaline Phos 38 - 126 U/L -  AST 15 - 41 U/L -  ALT 14 - 54 U/L -   CBC Latest Ref Rng & Units 04/30/2017  WBC 3.6 - 11.0 K/uL 8.1  Hemoglobin 12.0 - 16.0 g/dL 11.9(L)  Hematocrit 35.0 - 47.0 % 36.4  Platelets 150 - 440 K/uL 278     Assessment and plan- Patient is a 83 y.o. female with following issues:  1. Breast cancer- 1st diagnosed in 2008. S/p bilateral mastectomy. On anastrozole between 2009-2014. Off since then. No evidence of chest wall recurrence on todays exam  2. Anemia of chronic  kidney disease- hemoglobin stable since last 6 months. Repeat cbc ferritin and iron studies in 3 and 6 months. I will see her in 6 months. Will also check b12 and folate at that time   Visit Diagnosis 1. Malignant neoplasm of breast in female, estrogen receptor positive, unspecified laterality, unspecified site of breast ([place])   2. Anemia of chronic renal failure, unspecified CKD stage      Dr. Randa Evens, MD, MPH [facility] Pager- [phone] 04/30/2017 11:35 AM

## 2017-04-30 NOTE — Telephone Encounter (Signed)
Patient's daughter notified via telephone that HGB is stable.   dhs

## 2017-04-30 NOTE — Progress Notes (Signed)
Patient's daughter notified.  dhs

## 2017-04-30 NOTE — Progress Notes (Signed)
Patient here for follow up with labs today. She states that she is feeling well today except for chronic left hip pain, which "only hurts when she walks". She has a history of double mastectomy. She has already received her Flu Vaccine.

## 2017-04-30 NOTE — Progress Notes (Signed)
Patient's daughter notified via telephone that HGB is stable.   dhs

## 2017-06-04 DIAGNOSIS — D631 Anemia in chronic kidney disease: Secondary | ICD-10-CM | POA: Diagnosis not present

## 2017-06-04 DIAGNOSIS — N2581 Secondary hyperparathyroidism of renal origin: Secondary | ICD-10-CM | POA: Diagnosis not present

## 2017-06-04 DIAGNOSIS — N184 Chronic kidney disease, stage 4 (severe): Secondary | ICD-10-CM | POA: Diagnosis not present

## 2017-06-04 DIAGNOSIS — I10 Essential (primary) hypertension: Secondary | ICD-10-CM | POA: Diagnosis not present

## 2017-06-04 DIAGNOSIS — E872 Acidosis: Secondary | ICD-10-CM | POA: Diagnosis not present

## 2017-06-06 DIAGNOSIS — E872 Acidosis: Secondary | ICD-10-CM | POA: Diagnosis not present

## 2017-06-06 DIAGNOSIS — I10 Essential (primary) hypertension: Secondary | ICD-10-CM | POA: Diagnosis not present

## 2017-06-06 DIAGNOSIS — N184 Chronic kidney disease, stage 4 (severe): Secondary | ICD-10-CM | POA: Diagnosis not present

## 2017-06-06 DIAGNOSIS — N2581 Secondary hyperparathyroidism of renal origin: Secondary | ICD-10-CM | POA: Diagnosis not present

## 2017-06-06 DIAGNOSIS — D631 Anemia in chronic kidney disease: Secondary | ICD-10-CM | POA: Diagnosis not present

## 2017-06-22 DIAGNOSIS — I10 Essential (primary) hypertension: Secondary | ICD-10-CM | POA: Diagnosis not present

## 2017-06-22 DIAGNOSIS — D631 Anemia in chronic kidney disease: Secondary | ICD-10-CM | POA: Diagnosis not present

## 2017-06-22 DIAGNOSIS — I701 Atherosclerosis of renal artery: Secondary | ICD-10-CM | POA: Diagnosis not present

## 2017-06-22 DIAGNOSIS — E872 Acidosis: Secondary | ICD-10-CM | POA: Diagnosis not present

## 2017-06-22 DIAGNOSIS — N2581 Secondary hyperparathyroidism of renal origin: Secondary | ICD-10-CM | POA: Diagnosis not present

## 2017-06-22 DIAGNOSIS — N184 Chronic kidney disease, stage 4 (severe): Secondary | ICD-10-CM | POA: Diagnosis not present

## 2017-07-06 DIAGNOSIS — I701 Atherosclerosis of renal artery: Secondary | ICD-10-CM | POA: Diagnosis not present

## 2017-07-06 DIAGNOSIS — N2581 Secondary hyperparathyroidism of renal origin: Secondary | ICD-10-CM | POA: Diagnosis not present

## 2017-07-06 DIAGNOSIS — N184 Chronic kidney disease, stage 4 (severe): Secondary | ICD-10-CM | POA: Diagnosis not present

## 2017-07-06 DIAGNOSIS — D631 Anemia in chronic kidney disease: Secondary | ICD-10-CM | POA: Diagnosis not present

## 2017-07-06 DIAGNOSIS — I10 Essential (primary) hypertension: Secondary | ICD-10-CM | POA: Diagnosis not present

## 2017-07-06 DIAGNOSIS — E872 Acidosis: Secondary | ICD-10-CM | POA: Diagnosis not present

## 2017-07-10 ENCOUNTER — Other Ambulatory Visit (INDEPENDENT_AMBULATORY_CARE_PROVIDER_SITE_OTHER): Payer: Self-pay | Admitting: Vascular Surgery

## 2017-07-10 DIAGNOSIS — N185 Chronic kidney disease, stage 5: Secondary | ICD-10-CM | POA: Diagnosis not present

## 2017-07-10 DIAGNOSIS — N2581 Secondary hyperparathyroidism of renal origin: Secondary | ICD-10-CM | POA: Diagnosis not present

## 2017-07-10 DIAGNOSIS — I10 Essential (primary) hypertension: Secondary | ICD-10-CM | POA: Diagnosis not present

## 2017-07-10 DIAGNOSIS — D631 Anemia in chronic kidney disease: Secondary | ICD-10-CM | POA: Diagnosis not present

## 2017-07-10 MED ORDER — CEFAZOLIN SODIUM-DEXTROSE 1-4 GM/50ML-% IV SOLN: 1.0000 g | Freq: Once | INTRAVENOUS | Status: DC

## 2017-07-11 ENCOUNTER — Ambulatory Visit: Admission: RE | Admit: 2017-07-11 | Payer: Medicare Other | Source: Ambulatory Visit | Admitting: Vascular Surgery

## 2017-07-11 SURGERY — DIALYSIS/PERMA CATHETER INSERTION
Anesthesia: Moderate Sedation

## 2017-07-25 DIAGNOSIS — D631 Anemia in chronic kidney disease: Secondary | ICD-10-CM | POA: Diagnosis not present

## 2017-07-25 DIAGNOSIS — I701 Atherosclerosis of renal artery: Secondary | ICD-10-CM | POA: Diagnosis not present

## 2017-07-25 DIAGNOSIS — N2581 Secondary hyperparathyroidism of renal origin: Secondary | ICD-10-CM | POA: Diagnosis not present

## 2017-07-25 DIAGNOSIS — N184 Chronic kidney disease, stage 4 (severe): Secondary | ICD-10-CM | POA: Diagnosis not present

## 2017-07-25 DIAGNOSIS — E872 Acidosis: Secondary | ICD-10-CM | POA: Diagnosis not present

## 2017-07-25 DIAGNOSIS — I10 Essential (primary) hypertension: Secondary | ICD-10-CM | POA: Diagnosis not present

## 2017-07-27 DIAGNOSIS — M5432 Sciatica, left side: Secondary | ICD-10-CM | POA: Diagnosis not present

## 2017-07-27 DIAGNOSIS — E039 Hypothyroidism, unspecified: Secondary | ICD-10-CM | POA: Diagnosis not present

## 2017-07-27 DIAGNOSIS — K1120 Sialoadenitis, unspecified: Secondary | ICD-10-CM | POA: Diagnosis not present

## 2017-07-30 ENCOUNTER — Inpatient Hospital Stay: Payer: Medicare Other

## 2017-08-01 ENCOUNTER — Inpatient Hospital Stay: Payer: Medicare Other | Attending: Oncology

## 2017-08-01 DIAGNOSIS — Z87891 Personal history of nicotine dependence: Secondary | ICD-10-CM | POA: Diagnosis not present

## 2017-08-01 DIAGNOSIS — Z853 Personal history of malignant neoplasm of breast: Secondary | ICD-10-CM | POA: Diagnosis not present

## 2017-08-01 DIAGNOSIS — Z170 Estrogen receptor positive status [ER+]: Secondary | ICD-10-CM | POA: Diagnosis not present

## 2017-08-01 DIAGNOSIS — N184 Chronic kidney disease, stage 4 (severe): Secondary | ICD-10-CM | POA: Diagnosis not present

## 2017-08-01 DIAGNOSIS — D631 Anemia in chronic kidney disease: Secondary | ICD-10-CM | POA: Insufficient documentation

## 2017-08-01 DIAGNOSIS — Z9011 Acquired absence of right breast and nipple: Secondary | ICD-10-CM | POA: Insufficient documentation

## 2017-08-01 DIAGNOSIS — Z79899 Other long term (current) drug therapy: Secondary | ICD-10-CM | POA: Insufficient documentation

## 2017-08-01 DIAGNOSIS — E1122 Type 2 diabetes mellitus with diabetic chronic kidney disease: Secondary | ICD-10-CM | POA: Diagnosis not present

## 2017-08-01 DIAGNOSIS — Z7982 Long term (current) use of aspirin: Secondary | ICD-10-CM | POA: Diagnosis not present

## 2017-08-01 DIAGNOSIS — C50919 Malignant neoplasm of unspecified site of unspecified female breast: Secondary | ICD-10-CM

## 2017-08-01 LAB — CBC WITH DIFFERENTIAL/PLATELET
BASOS ABS: 0.1 10*3/uL (ref 0–0.1)
Basophils Relative: 1 %
Eosinophils Absolute: 0.1 10*3/uL (ref 0–0.7)
Eosinophils Relative: 2 %
HCT: 33 % — ABNORMAL LOW (ref 35.0–47.0)
HEMOGLOBIN: 11.2 g/dL — AB (ref 12.0–16.0)
LYMPHS ABS: 2.6 10*3/uL (ref 1.0–3.6)
LYMPHS PCT: 31 %
MCH: 30.5 pg (ref 26.0–34.0)
MCHC: 33.8 g/dL (ref 32.0–36.0)
MCV: 90.3 fL (ref 80.0–100.0)
Monocytes Absolute: 0.6 10*3/uL (ref 0.2–0.9)
Monocytes Relative: 7 %
NEUTROS PCT: 59 %
Neutro Abs: 4.9 10*3/uL (ref 1.4–6.5)
Platelets: 271 10*3/uL (ref 150–440)
RBC: 3.65 MIL/uL — AB (ref 3.80–5.20)
RDW: 15.2 % — ABNORMAL HIGH (ref 11.5–14.5)
WBC: 8.4 10*3/uL (ref 3.6–11.0)

## 2017-08-01 LAB — FERRITIN: FERRITIN: 92 ng/mL (ref 11–307)

## 2017-08-01 LAB — IRON AND TIBC
Iron: 54 ug/dL (ref 28–170)
Saturation Ratios: 23 % (ref 10.4–31.8)
TIBC: 233 ug/dL — ABNORMAL LOW (ref 250–450)
UIBC: 179 ug/dL

## 2017-08-07 ENCOUNTER — Telehealth: Payer: Self-pay | Admitting: *Deleted

## 2017-08-07 NOTE — Telephone Encounter (Signed)
Called the daughter and told her the ferritin is higher and the hgb not changed very much and she is over 13. No need for any action at this time

## 2017-08-07 NOTE — Telephone Encounter (Signed)
-----   Message from Secundino Ginger sent at 08/07/2017  2:27 PM EST ----- Regarding: lab results Contact: [phone] Pt called for lab results

## 2017-08-09 DIAGNOSIS — I10 Essential (primary) hypertension: Secondary | ICD-10-CM | POA: Diagnosis not present

## 2017-08-09 DIAGNOSIS — N2581 Secondary hyperparathyroidism of renal origin: Secondary | ICD-10-CM | POA: Diagnosis not present

## 2017-08-09 DIAGNOSIS — N185 Chronic kidney disease, stage 5: Secondary | ICD-10-CM | POA: Diagnosis not present

## 2017-08-09 DIAGNOSIS — E872 Acidosis: Secondary | ICD-10-CM | POA: Diagnosis not present

## 2017-08-09 DIAGNOSIS — D631 Anemia in chronic kidney disease: Secondary | ICD-10-CM | POA: Diagnosis not present

## 2017-08-14 ENCOUNTER — Encounter (INDEPENDENT_AMBULATORY_CARE_PROVIDER_SITE_OTHER): Payer: Medicare Other

## 2017-08-14 ENCOUNTER — Ambulatory Visit (INDEPENDENT_AMBULATORY_CARE_PROVIDER_SITE_OTHER): Payer: Medicare Other | Admitting: Vascular Surgery

## 2017-09-13 DIAGNOSIS — N184 Chronic kidney disease, stage 4 (severe): Secondary | ICD-10-CM | POA: Diagnosis not present

## 2017-09-13 DIAGNOSIS — E872 Acidosis: Secondary | ICD-10-CM | POA: Diagnosis not present

## 2017-09-13 DIAGNOSIS — I10 Essential (primary) hypertension: Secondary | ICD-10-CM | POA: Diagnosis not present

## 2017-09-13 DIAGNOSIS — D631 Anemia in chronic kidney disease: Secondary | ICD-10-CM | POA: Diagnosis not present

## 2017-09-13 DIAGNOSIS — N2581 Secondary hyperparathyroidism of renal origin: Secondary | ICD-10-CM | POA: Diagnosis not present

## 2017-09-13 DIAGNOSIS — I701 Atherosclerosis of renal artery: Secondary | ICD-10-CM | POA: Diagnosis not present

## 2017-09-19 DIAGNOSIS — N2581 Secondary hyperparathyroidism of renal origin: Secondary | ICD-10-CM | POA: Diagnosis not present

## 2017-09-19 DIAGNOSIS — N185 Chronic kidney disease, stage 5: Secondary | ICD-10-CM | POA: Diagnosis not present

## 2017-09-19 DIAGNOSIS — D631 Anemia in chronic kidney disease: Secondary | ICD-10-CM | POA: Diagnosis not present

## 2017-09-19 DIAGNOSIS — I10 Essential (primary) hypertension: Secondary | ICD-10-CM | POA: Diagnosis not present

## 2017-09-19 DIAGNOSIS — E872 Acidosis: Secondary | ICD-10-CM | POA: Diagnosis not present

## 2017-09-27 ENCOUNTER — Ambulatory Visit: Admit: 2017-09-27 | Payer: Medicare Other | Admitting: Vascular Surgery

## 2017-09-27 SURGERY — DIALYSIS/PERMA CATHETER INSERTION
Anesthesia: Moderate Sedation

## 2017-10-29 ENCOUNTER — Inpatient Hospital Stay: Payer: Medicare Other

## 2017-10-29 ENCOUNTER — Inpatient Hospital Stay: Payer: Medicare Other | Admitting: Oncology

## 2017-11-05 ENCOUNTER — Inpatient Hospital Stay: Payer: Medicare Other

## 2017-11-05 ENCOUNTER — Inpatient Hospital Stay: Payer: Medicare Other | Admitting: Oncology

## 2017-11-22 DIAGNOSIS — E872 Acidosis: Secondary | ICD-10-CM | POA: Diagnosis not present

## 2017-11-22 DIAGNOSIS — N186 End stage renal disease: Secondary | ICD-10-CM | POA: Diagnosis not present

## 2017-11-22 DIAGNOSIS — D631 Anemia in chronic kidney disease: Secondary | ICD-10-CM | POA: Diagnosis not present

## 2017-11-22 DIAGNOSIS — N2581 Secondary hyperparathyroidism of renal origin: Secondary | ICD-10-CM | POA: Diagnosis not present

## 2017-11-22 DIAGNOSIS — I10 Essential (primary) hypertension: Secondary | ICD-10-CM | POA: Diagnosis not present

## 2017-11-29 DIAGNOSIS — I509 Heart failure, unspecified: Secondary | ICD-10-CM | POA: Diagnosis not present

## 2017-11-29 DIAGNOSIS — E1139 Type 2 diabetes mellitus with other diabetic ophthalmic complication: Secondary | ICD-10-CM | POA: Diagnosis not present

## 2017-11-29 DIAGNOSIS — N186 End stage renal disease: Secondary | ICD-10-CM | POA: Diagnosis not present

## 2017-11-29 DIAGNOSIS — Z853 Personal history of malignant neoplasm of breast: Secondary | ICD-10-CM | POA: Diagnosis not present

## 2017-11-29 DIAGNOSIS — M109 Gout, unspecified: Secondary | ICD-10-CM | POA: Diagnosis not present

## 2017-11-29 DIAGNOSIS — K5190 Ulcerative colitis, unspecified, without complications: Secondary | ICD-10-CM | POA: Diagnosis not present

## 2017-11-29 DIAGNOSIS — E1122 Type 2 diabetes mellitus with diabetic chronic kidney disease: Secondary | ICD-10-CM | POA: Diagnosis not present

## 2017-11-29 DIAGNOSIS — H543 Unqualified visual loss, both eyes: Secondary | ICD-10-CM | POA: Diagnosis not present

## 2017-11-29 DIAGNOSIS — I2510 Atherosclerotic heart disease of native coronary artery without angina pectoris: Secondary | ICD-10-CM | POA: Diagnosis not present

## 2017-11-29 DIAGNOSIS — F0150 Vascular dementia without behavioral disturbance: Secondary | ICD-10-CM | POA: Diagnosis not present

## 2017-11-29 DIAGNOSIS — Z87891 Personal history of nicotine dependence: Secondary | ICD-10-CM | POA: Diagnosis not present

## 2017-11-29 DIAGNOSIS — Z9013 Acquired absence of bilateral breasts and nipples: Secondary | ICD-10-CM | POA: Diagnosis not present

## 2017-11-29 DIAGNOSIS — E213 Hyperparathyroidism, unspecified: Secondary | ICD-10-CM | POA: Diagnosis not present

## 2017-11-29 DIAGNOSIS — M810 Age-related osteoporosis without current pathological fracture: Secondary | ICD-10-CM | POA: Diagnosis not present

## 2017-11-29 DIAGNOSIS — D631 Anemia in chronic kidney disease: Secondary | ICD-10-CM | POA: Diagnosis not present

## 2017-11-29 DIAGNOSIS — I132 Hypertensive heart and chronic kidney disease with heart failure and with stage 5 chronic kidney disease, or end stage renal disease: Secondary | ICD-10-CM | POA: Diagnosis not present

## 2017-11-30 DIAGNOSIS — I2510 Atherosclerotic heart disease of native coronary artery without angina pectoris: Secondary | ICD-10-CM | POA: Diagnosis not present

## 2017-11-30 DIAGNOSIS — E1122 Type 2 diabetes mellitus with diabetic chronic kidney disease: Secondary | ICD-10-CM | POA: Diagnosis not present

## 2017-11-30 DIAGNOSIS — I132 Hypertensive heart and chronic kidney disease with heart failure and with stage 5 chronic kidney disease, or end stage renal disease: Secondary | ICD-10-CM | POA: Diagnosis not present

## 2017-11-30 DIAGNOSIS — E1139 Type 2 diabetes mellitus with other diabetic ophthalmic complication: Secondary | ICD-10-CM | POA: Diagnosis not present

## 2017-11-30 DIAGNOSIS — N186 End stage renal disease: Secondary | ICD-10-CM | POA: Diagnosis not present

## 2017-11-30 DIAGNOSIS — I509 Heart failure, unspecified: Secondary | ICD-10-CM | POA: Diagnosis not present

## 2017-12-01 DIAGNOSIS — I509 Heart failure, unspecified: Secondary | ICD-10-CM | POA: Diagnosis not present

## 2017-12-01 DIAGNOSIS — E213 Hyperparathyroidism, unspecified: Secondary | ICD-10-CM | POA: Diagnosis not present

## 2017-12-01 DIAGNOSIS — I132 Hypertensive heart and chronic kidney disease with heart failure and with stage 5 chronic kidney disease, or end stage renal disease: Secondary | ICD-10-CM | POA: Diagnosis not present

## 2017-12-01 DIAGNOSIS — Z87891 Personal history of nicotine dependence: Secondary | ICD-10-CM | POA: Diagnosis not present

## 2017-12-01 DIAGNOSIS — H543 Unqualified visual loss, both eyes: Secondary | ICD-10-CM | POA: Diagnosis not present

## 2017-12-01 DIAGNOSIS — F0150 Vascular dementia without behavioral disturbance: Secondary | ICD-10-CM | POA: Diagnosis not present

## 2017-12-01 DIAGNOSIS — M109 Gout, unspecified: Secondary | ICD-10-CM | POA: Diagnosis not present

## 2017-12-01 DIAGNOSIS — D631 Anemia in chronic kidney disease: Secondary | ICD-10-CM | POA: Diagnosis not present

## 2017-12-01 DIAGNOSIS — E1139 Type 2 diabetes mellitus with other diabetic ophthalmic complication: Secondary | ICD-10-CM | POA: Diagnosis not present

## 2017-12-01 DIAGNOSIS — E1122 Type 2 diabetes mellitus with diabetic chronic kidney disease: Secondary | ICD-10-CM | POA: Diagnosis not present

## 2017-12-01 DIAGNOSIS — Z9013 Acquired absence of bilateral breasts and nipples: Secondary | ICD-10-CM | POA: Diagnosis not present

## 2017-12-01 DIAGNOSIS — K5190 Ulcerative colitis, unspecified, without complications: Secondary | ICD-10-CM | POA: Diagnosis not present

## 2017-12-01 DIAGNOSIS — Z853 Personal history of malignant neoplasm of breast: Secondary | ICD-10-CM | POA: Diagnosis not present

## 2017-12-01 DIAGNOSIS — M810 Age-related osteoporosis without current pathological fracture: Secondary | ICD-10-CM | POA: Diagnosis not present

## 2017-12-01 DIAGNOSIS — I2510 Atherosclerotic heart disease of native coronary artery without angina pectoris: Secondary | ICD-10-CM | POA: Diagnosis not present

## 2017-12-01 DIAGNOSIS — N186 End stage renal disease: Secondary | ICD-10-CM | POA: Diagnosis not present

## 2017-12-05 DIAGNOSIS — I2510 Atherosclerotic heart disease of native coronary artery without angina pectoris: Secondary | ICD-10-CM | POA: Diagnosis not present

## 2017-12-05 DIAGNOSIS — I509 Heart failure, unspecified: Secondary | ICD-10-CM | POA: Diagnosis not present

## 2017-12-05 DIAGNOSIS — N186 End stage renal disease: Secondary | ICD-10-CM | POA: Diagnosis not present

## 2017-12-05 DIAGNOSIS — E1122 Type 2 diabetes mellitus with diabetic chronic kidney disease: Secondary | ICD-10-CM | POA: Diagnosis not present

## 2017-12-05 DIAGNOSIS — E1139 Type 2 diabetes mellitus with other diabetic ophthalmic complication: Secondary | ICD-10-CM | POA: Diagnosis not present

## 2017-12-05 DIAGNOSIS — I132 Hypertensive heart and chronic kidney disease with heart failure and with stage 5 chronic kidney disease, or end stage renal disease: Secondary | ICD-10-CM | POA: Diagnosis not present

## 2017-12-13 DIAGNOSIS — I132 Hypertensive heart and chronic kidney disease with heart failure and with stage 5 chronic kidney disease, or end stage renal disease: Secondary | ICD-10-CM | POA: Diagnosis not present

## 2017-12-13 DIAGNOSIS — E1122 Type 2 diabetes mellitus with diabetic chronic kidney disease: Secondary | ICD-10-CM | POA: Diagnosis not present

## 2017-12-13 DIAGNOSIS — N186 End stage renal disease: Secondary | ICD-10-CM | POA: Diagnosis not present

## 2017-12-13 DIAGNOSIS — E1139 Type 2 diabetes mellitus with other diabetic ophthalmic complication: Secondary | ICD-10-CM | POA: Diagnosis not present

## 2017-12-13 DIAGNOSIS — I2510 Atherosclerotic heart disease of native coronary artery without angina pectoris: Secondary | ICD-10-CM | POA: Diagnosis not present

## 2017-12-13 DIAGNOSIS — I509 Heart failure, unspecified: Secondary | ICD-10-CM | POA: Diagnosis not present

## 2017-12-17 DIAGNOSIS — E1122 Type 2 diabetes mellitus with diabetic chronic kidney disease: Secondary | ICD-10-CM | POA: Diagnosis not present

## 2017-12-17 DIAGNOSIS — I2510 Atherosclerotic heart disease of native coronary artery without angina pectoris: Secondary | ICD-10-CM | POA: Diagnosis not present

## 2017-12-17 DIAGNOSIS — N186 End stage renal disease: Secondary | ICD-10-CM | POA: Diagnosis not present

## 2017-12-17 DIAGNOSIS — I509 Heart failure, unspecified: Secondary | ICD-10-CM | POA: Diagnosis not present

## 2017-12-17 DIAGNOSIS — I132 Hypertensive heart and chronic kidney disease with heart failure and with stage 5 chronic kidney disease, or end stage renal disease: Secondary | ICD-10-CM | POA: Diagnosis not present

## 2017-12-17 DIAGNOSIS — E1139 Type 2 diabetes mellitus with other diabetic ophthalmic complication: Secondary | ICD-10-CM | POA: Diagnosis not present

## 2017-12-26 DIAGNOSIS — I132 Hypertensive heart and chronic kidney disease with heart failure and with stage 5 chronic kidney disease, or end stage renal disease: Secondary | ICD-10-CM | POA: Diagnosis not present

## 2017-12-26 DIAGNOSIS — N186 End stage renal disease: Secondary | ICD-10-CM | POA: Diagnosis not present

## 2017-12-26 DIAGNOSIS — E1139 Type 2 diabetes mellitus with other diabetic ophthalmic complication: Secondary | ICD-10-CM | POA: Diagnosis not present

## 2017-12-26 DIAGNOSIS — E1122 Type 2 diabetes mellitus with diabetic chronic kidney disease: Secondary | ICD-10-CM | POA: Diagnosis not present

## 2017-12-26 DIAGNOSIS — I2510 Atherosclerotic heart disease of native coronary artery without angina pectoris: Secondary | ICD-10-CM | POA: Diagnosis not present

## 2017-12-26 DIAGNOSIS — I509 Heart failure, unspecified: Secondary | ICD-10-CM | POA: Diagnosis not present

## 2017-12-31 DIAGNOSIS — Z9013 Acquired absence of bilateral breasts and nipples: Secondary | ICD-10-CM | POA: Diagnosis not present

## 2017-12-31 DIAGNOSIS — E1139 Type 2 diabetes mellitus with other diabetic ophthalmic complication: Secondary | ICD-10-CM | POA: Diagnosis not present

## 2017-12-31 DIAGNOSIS — I132 Hypertensive heart and chronic kidney disease with heart failure and with stage 5 chronic kidney disease, or end stage renal disease: Secondary | ICD-10-CM | POA: Diagnosis not present

## 2017-12-31 DIAGNOSIS — H543 Unqualified visual loss, both eyes: Secondary | ICD-10-CM | POA: Diagnosis not present

## 2017-12-31 DIAGNOSIS — M109 Gout, unspecified: Secondary | ICD-10-CM | POA: Diagnosis not present

## 2017-12-31 DIAGNOSIS — K5190 Ulcerative colitis, unspecified, without complications: Secondary | ICD-10-CM | POA: Diagnosis not present

## 2017-12-31 DIAGNOSIS — Z853 Personal history of malignant neoplasm of breast: Secondary | ICD-10-CM | POA: Diagnosis not present

## 2017-12-31 DIAGNOSIS — I509 Heart failure, unspecified: Secondary | ICD-10-CM | POA: Diagnosis not present

## 2017-12-31 DIAGNOSIS — D631 Anemia in chronic kidney disease: Secondary | ICD-10-CM | POA: Diagnosis not present

## 2017-12-31 DIAGNOSIS — M810 Age-related osteoporosis without current pathological fracture: Secondary | ICD-10-CM | POA: Diagnosis not present

## 2017-12-31 DIAGNOSIS — I2510 Atherosclerotic heart disease of native coronary artery without angina pectoris: Secondary | ICD-10-CM | POA: Diagnosis not present

## 2017-12-31 DIAGNOSIS — E213 Hyperparathyroidism, unspecified: Secondary | ICD-10-CM | POA: Diagnosis not present

## 2017-12-31 DIAGNOSIS — E1122 Type 2 diabetes mellitus with diabetic chronic kidney disease: Secondary | ICD-10-CM | POA: Diagnosis not present

## 2017-12-31 DIAGNOSIS — F0150 Vascular dementia without behavioral disturbance: Secondary | ICD-10-CM | POA: Diagnosis not present

## 2017-12-31 DIAGNOSIS — N186 End stage renal disease: Secondary | ICD-10-CM | POA: Diagnosis not present

## 2017-12-31 DIAGNOSIS — Z87891 Personal history of nicotine dependence: Secondary | ICD-10-CM | POA: Diagnosis not present

## 2018-01-04 DIAGNOSIS — I2510 Atherosclerotic heart disease of native coronary artery without angina pectoris: Secondary | ICD-10-CM | POA: Diagnosis not present

## 2018-01-04 DIAGNOSIS — I132 Hypertensive heart and chronic kidney disease with heart failure and with stage 5 chronic kidney disease, or end stage renal disease: Secondary | ICD-10-CM | POA: Diagnosis not present

## 2018-01-04 DIAGNOSIS — N186 End stage renal disease: Secondary | ICD-10-CM | POA: Diagnosis not present

## 2018-01-04 DIAGNOSIS — I509 Heart failure, unspecified: Secondary | ICD-10-CM | POA: Diagnosis not present

## 2018-01-04 DIAGNOSIS — E1122 Type 2 diabetes mellitus with diabetic chronic kidney disease: Secondary | ICD-10-CM | POA: Diagnosis not present

## 2018-01-04 DIAGNOSIS — E1139 Type 2 diabetes mellitus with other diabetic ophthalmic complication: Secondary | ICD-10-CM | POA: Diagnosis not present

## 2018-01-08 DIAGNOSIS — I2510 Atherosclerotic heart disease of native coronary artery without angina pectoris: Secondary | ICD-10-CM | POA: Diagnosis not present

## 2018-01-08 DIAGNOSIS — E1122 Type 2 diabetes mellitus with diabetic chronic kidney disease: Secondary | ICD-10-CM | POA: Diagnosis not present

## 2018-01-08 DIAGNOSIS — I132 Hypertensive heart and chronic kidney disease with heart failure and with stage 5 chronic kidney disease, or end stage renal disease: Secondary | ICD-10-CM | POA: Diagnosis not present

## 2018-01-08 DIAGNOSIS — N186 End stage renal disease: Secondary | ICD-10-CM | POA: Diagnosis not present

## 2018-01-08 DIAGNOSIS — I509 Heart failure, unspecified: Secondary | ICD-10-CM | POA: Diagnosis not present

## 2018-01-08 DIAGNOSIS — E1139 Type 2 diabetes mellitus with other diabetic ophthalmic complication: Secondary | ICD-10-CM | POA: Diagnosis not present

## 2018-01-10 DIAGNOSIS — I132 Hypertensive heart and chronic kidney disease with heart failure and with stage 5 chronic kidney disease, or end stage renal disease: Secondary | ICD-10-CM | POA: Diagnosis not present

## 2018-01-10 DIAGNOSIS — I509 Heart failure, unspecified: Secondary | ICD-10-CM | POA: Diagnosis not present

## 2018-01-10 DIAGNOSIS — E1122 Type 2 diabetes mellitus with diabetic chronic kidney disease: Secondary | ICD-10-CM | POA: Diagnosis not present

## 2018-01-10 DIAGNOSIS — I2510 Atherosclerotic heart disease of native coronary artery without angina pectoris: Secondary | ICD-10-CM | POA: Diagnosis not present

## 2018-01-10 DIAGNOSIS — N186 End stage renal disease: Secondary | ICD-10-CM | POA: Diagnosis not present

## 2018-01-10 DIAGNOSIS — E1139 Type 2 diabetes mellitus with other diabetic ophthalmic complication: Secondary | ICD-10-CM | POA: Diagnosis not present

## 2018-01-17 DIAGNOSIS — E1139 Type 2 diabetes mellitus with other diabetic ophthalmic complication: Secondary | ICD-10-CM | POA: Diagnosis not present

## 2018-01-17 DIAGNOSIS — I132 Hypertensive heart and chronic kidney disease with heart failure and with stage 5 chronic kidney disease, or end stage renal disease: Secondary | ICD-10-CM | POA: Diagnosis not present

## 2018-01-17 DIAGNOSIS — E1122 Type 2 diabetes mellitus with diabetic chronic kidney disease: Secondary | ICD-10-CM | POA: Diagnosis not present

## 2018-01-17 DIAGNOSIS — I509 Heart failure, unspecified: Secondary | ICD-10-CM | POA: Diagnosis not present

## 2018-01-17 DIAGNOSIS — N186 End stage renal disease: Secondary | ICD-10-CM | POA: Diagnosis not present

## 2018-01-17 DIAGNOSIS — I2510 Atherosclerotic heart disease of native coronary artery without angina pectoris: Secondary | ICD-10-CM | POA: Diagnosis not present

## 2018-01-22 DIAGNOSIS — I509 Heart failure, unspecified: Secondary | ICD-10-CM | POA: Diagnosis not present

## 2018-01-22 DIAGNOSIS — I132 Hypertensive heart and chronic kidney disease with heart failure and with stage 5 chronic kidney disease, or end stage renal disease: Secondary | ICD-10-CM | POA: Diagnosis not present

## 2018-01-22 DIAGNOSIS — E1139 Type 2 diabetes mellitus with other diabetic ophthalmic complication: Secondary | ICD-10-CM | POA: Diagnosis not present

## 2018-01-22 DIAGNOSIS — E1122 Type 2 diabetes mellitus with diabetic chronic kidney disease: Secondary | ICD-10-CM | POA: Diagnosis not present

## 2018-01-22 DIAGNOSIS — I2510 Atherosclerotic heart disease of native coronary artery without angina pectoris: Secondary | ICD-10-CM | POA: Diagnosis not present

## 2018-01-22 DIAGNOSIS — N186 End stage renal disease: Secondary | ICD-10-CM | POA: Diagnosis not present

## 2018-01-23 DIAGNOSIS — I509 Heart failure, unspecified: Secondary | ICD-10-CM | POA: Diagnosis not present

## 2018-01-23 DIAGNOSIS — I2510 Atherosclerotic heart disease of native coronary artery without angina pectoris: Secondary | ICD-10-CM | POA: Diagnosis not present

## 2018-01-23 DIAGNOSIS — N186 End stage renal disease: Secondary | ICD-10-CM | POA: Diagnosis not present

## 2018-01-23 DIAGNOSIS — I132 Hypertensive heart and chronic kidney disease with heart failure and with stage 5 chronic kidney disease, or end stage renal disease: Secondary | ICD-10-CM | POA: Diagnosis not present

## 2018-01-23 DIAGNOSIS — E1139 Type 2 diabetes mellitus with other diabetic ophthalmic complication: Secondary | ICD-10-CM | POA: Diagnosis not present

## 2018-01-23 DIAGNOSIS — E1122 Type 2 diabetes mellitus with diabetic chronic kidney disease: Secondary | ICD-10-CM | POA: Diagnosis not present

## 2018-01-31 DIAGNOSIS — F05 Delirium due to known physiological condition: Secondary | ICD-10-CM | POA: Diagnosis not present

## 2018-01-31 DIAGNOSIS — M810 Age-related osteoporosis without current pathological fracture: Secondary | ICD-10-CM | POA: Diagnosis not present

## 2018-01-31 DIAGNOSIS — Z853 Personal history of malignant neoplasm of breast: Secondary | ICD-10-CM | POA: Diagnosis not present

## 2018-01-31 DIAGNOSIS — F0150 Vascular dementia without behavioral disturbance: Secondary | ICD-10-CM | POA: Diagnosis not present

## 2018-01-31 DIAGNOSIS — N186 End stage renal disease: Secondary | ICD-10-CM | POA: Diagnosis not present

## 2018-01-31 DIAGNOSIS — E1122 Type 2 diabetes mellitus with diabetic chronic kidney disease: Secondary | ICD-10-CM | POA: Diagnosis not present

## 2018-01-31 DIAGNOSIS — D631 Anemia in chronic kidney disease: Secondary | ICD-10-CM | POA: Diagnosis not present

## 2018-01-31 DIAGNOSIS — Z87891 Personal history of nicotine dependence: Secondary | ICD-10-CM | POA: Diagnosis not present

## 2018-01-31 DIAGNOSIS — I2510 Atherosclerotic heart disease of native coronary artery without angina pectoris: Secondary | ICD-10-CM | POA: Diagnosis not present

## 2018-01-31 DIAGNOSIS — M109 Gout, unspecified: Secondary | ICD-10-CM | POA: Diagnosis not present

## 2018-01-31 DIAGNOSIS — I132 Hypertensive heart and chronic kidney disease with heart failure and with stage 5 chronic kidney disease, or end stage renal disease: Secondary | ICD-10-CM | POA: Diagnosis not present

## 2018-01-31 DIAGNOSIS — E1139 Type 2 diabetes mellitus with other diabetic ophthalmic complication: Secondary | ICD-10-CM | POA: Diagnosis not present

## 2018-01-31 DIAGNOSIS — E213 Hyperparathyroidism, unspecified: Secondary | ICD-10-CM | POA: Diagnosis not present

## 2018-01-31 DIAGNOSIS — Z9013 Acquired absence of bilateral breasts and nipples: Secondary | ICD-10-CM | POA: Diagnosis not present

## 2018-01-31 DIAGNOSIS — H543 Unqualified visual loss, both eyes: Secondary | ICD-10-CM | POA: Diagnosis not present

## 2018-01-31 DIAGNOSIS — I509 Heart failure, unspecified: Secondary | ICD-10-CM | POA: Diagnosis not present

## 2018-01-31 DIAGNOSIS — K5190 Ulcerative colitis, unspecified, without complications: Secondary | ICD-10-CM | POA: Diagnosis not present

## 2018-02-01 DIAGNOSIS — I2510 Atherosclerotic heart disease of native coronary artery without angina pectoris: Secondary | ICD-10-CM | POA: Diagnosis not present

## 2018-02-01 DIAGNOSIS — N186 End stage renal disease: Secondary | ICD-10-CM | POA: Diagnosis not present

## 2018-02-01 DIAGNOSIS — I132 Hypertensive heart and chronic kidney disease with heart failure and with stage 5 chronic kidney disease, or end stage renal disease: Secondary | ICD-10-CM | POA: Diagnosis not present

## 2018-02-01 DIAGNOSIS — E1139 Type 2 diabetes mellitus with other diabetic ophthalmic complication: Secondary | ICD-10-CM | POA: Diagnosis not present

## 2018-02-01 DIAGNOSIS — E1122 Type 2 diabetes mellitus with diabetic chronic kidney disease: Secondary | ICD-10-CM | POA: Diagnosis not present

## 2018-02-01 DIAGNOSIS — I509 Heart failure, unspecified: Secondary | ICD-10-CM | POA: Diagnosis not present

## 2018-02-06 DIAGNOSIS — I2510 Atherosclerotic heart disease of native coronary artery without angina pectoris: Secondary | ICD-10-CM | POA: Diagnosis not present

## 2018-02-06 DIAGNOSIS — E1139 Type 2 diabetes mellitus with other diabetic ophthalmic complication: Secondary | ICD-10-CM | POA: Diagnosis not present

## 2018-02-06 DIAGNOSIS — E1122 Type 2 diabetes mellitus with diabetic chronic kidney disease: Secondary | ICD-10-CM | POA: Diagnosis not present

## 2018-02-06 DIAGNOSIS — I509 Heart failure, unspecified: Secondary | ICD-10-CM | POA: Diagnosis not present

## 2018-02-06 DIAGNOSIS — I132 Hypertensive heart and chronic kidney disease with heart failure and with stage 5 chronic kidney disease, or end stage renal disease: Secondary | ICD-10-CM | POA: Diagnosis not present

## 2018-02-06 DIAGNOSIS — N186 End stage renal disease: Secondary | ICD-10-CM | POA: Diagnosis not present

## 2018-02-13 DIAGNOSIS — E1139 Type 2 diabetes mellitus with other diabetic ophthalmic complication: Secondary | ICD-10-CM | POA: Diagnosis not present

## 2018-02-13 DIAGNOSIS — I509 Heart failure, unspecified: Secondary | ICD-10-CM | POA: Diagnosis not present

## 2018-02-13 DIAGNOSIS — I2510 Atherosclerotic heart disease of native coronary artery without angina pectoris: Secondary | ICD-10-CM | POA: Diagnosis not present

## 2018-02-13 DIAGNOSIS — E1122 Type 2 diabetes mellitus with diabetic chronic kidney disease: Secondary | ICD-10-CM | POA: Diagnosis not present

## 2018-02-13 DIAGNOSIS — N186 End stage renal disease: Secondary | ICD-10-CM | POA: Diagnosis not present

## 2018-02-13 DIAGNOSIS — I132 Hypertensive heart and chronic kidney disease with heart failure and with stage 5 chronic kidney disease, or end stage renal disease: Secondary | ICD-10-CM | POA: Diagnosis not present

## 2018-02-17 DIAGNOSIS — N186 End stage renal disease: Secondary | ICD-10-CM | POA: Diagnosis not present

## 2018-02-17 DIAGNOSIS — I132 Hypertensive heart and chronic kidney disease with heart failure and with stage 5 chronic kidney disease, or end stage renal disease: Secondary | ICD-10-CM | POA: Diagnosis not present

## 2018-02-17 DIAGNOSIS — I2510 Atherosclerotic heart disease of native coronary artery without angina pectoris: Secondary | ICD-10-CM | POA: Diagnosis not present

## 2018-02-17 DIAGNOSIS — E1139 Type 2 diabetes mellitus with other diabetic ophthalmic complication: Secondary | ICD-10-CM | POA: Diagnosis not present

## 2018-02-17 DIAGNOSIS — E1122 Type 2 diabetes mellitus with diabetic chronic kidney disease: Secondary | ICD-10-CM | POA: Diagnosis not present

## 2018-02-17 DIAGNOSIS — I509 Heart failure, unspecified: Secondary | ICD-10-CM | POA: Diagnosis not present

## 2018-02-19 DIAGNOSIS — N186 End stage renal disease: Secondary | ICD-10-CM | POA: Diagnosis not present

## 2018-02-19 DIAGNOSIS — I2510 Atherosclerotic heart disease of native coronary artery without angina pectoris: Secondary | ICD-10-CM | POA: Diagnosis not present

## 2018-02-19 DIAGNOSIS — I509 Heart failure, unspecified: Secondary | ICD-10-CM | POA: Diagnosis not present

## 2018-02-19 DIAGNOSIS — I132 Hypertensive heart and chronic kidney disease with heart failure and with stage 5 chronic kidney disease, or end stage renal disease: Secondary | ICD-10-CM | POA: Diagnosis not present

## 2018-02-19 DIAGNOSIS — E1122 Type 2 diabetes mellitus with diabetic chronic kidney disease: Secondary | ICD-10-CM | POA: Diagnosis not present

## 2018-02-19 DIAGNOSIS — E1139 Type 2 diabetes mellitus with other diabetic ophthalmic complication: Secondary | ICD-10-CM | POA: Diagnosis not present

## 2018-02-26 DIAGNOSIS — I509 Heart failure, unspecified: Secondary | ICD-10-CM | POA: Diagnosis not present

## 2018-02-26 DIAGNOSIS — E1139 Type 2 diabetes mellitus with other diabetic ophthalmic complication: Secondary | ICD-10-CM | POA: Diagnosis not present

## 2018-02-26 DIAGNOSIS — E1122 Type 2 diabetes mellitus with diabetic chronic kidney disease: Secondary | ICD-10-CM | POA: Diagnosis not present

## 2018-02-26 DIAGNOSIS — N186 End stage renal disease: Secondary | ICD-10-CM | POA: Diagnosis not present

## 2018-02-26 DIAGNOSIS — I132 Hypertensive heart and chronic kidney disease with heart failure and with stage 5 chronic kidney disease, or end stage renal disease: Secondary | ICD-10-CM | POA: Diagnosis not present

## 2018-02-26 DIAGNOSIS — I2510 Atherosclerotic heart disease of native coronary artery without angina pectoris: Secondary | ICD-10-CM | POA: Diagnosis not present

## 2018-03-03 DIAGNOSIS — Z853 Personal history of malignant neoplasm of breast: Secondary | ICD-10-CM | POA: Diagnosis not present

## 2018-03-03 DIAGNOSIS — H543 Unqualified visual loss, both eyes: Secondary | ICD-10-CM | POA: Diagnosis not present

## 2018-03-03 DIAGNOSIS — E1139 Type 2 diabetes mellitus with other diabetic ophthalmic complication: Secondary | ICD-10-CM | POA: Diagnosis not present

## 2018-03-03 DIAGNOSIS — I2510 Atherosclerotic heart disease of native coronary artery without angina pectoris: Secondary | ICD-10-CM | POA: Diagnosis not present

## 2018-03-03 DIAGNOSIS — F0150 Vascular dementia without behavioral disturbance: Secondary | ICD-10-CM | POA: Diagnosis not present

## 2018-03-03 DIAGNOSIS — I132 Hypertensive heart and chronic kidney disease with heart failure and with stage 5 chronic kidney disease, or end stage renal disease: Secondary | ICD-10-CM | POA: Diagnosis not present

## 2018-03-03 DIAGNOSIS — E213 Hyperparathyroidism, unspecified: Secondary | ICD-10-CM | POA: Diagnosis not present

## 2018-03-03 DIAGNOSIS — I509 Heart failure, unspecified: Secondary | ICD-10-CM | POA: Diagnosis not present

## 2018-03-03 DIAGNOSIS — D631 Anemia in chronic kidney disease: Secondary | ICD-10-CM | POA: Diagnosis not present

## 2018-03-03 DIAGNOSIS — E1122 Type 2 diabetes mellitus with diabetic chronic kidney disease: Secondary | ICD-10-CM | POA: Diagnosis not present

## 2018-03-03 DIAGNOSIS — K5190 Ulcerative colitis, unspecified, without complications: Secondary | ICD-10-CM | POA: Diagnosis not present

## 2018-03-03 DIAGNOSIS — N186 End stage renal disease: Secondary | ICD-10-CM | POA: Diagnosis not present

## 2018-03-03 DIAGNOSIS — Z87891 Personal history of nicotine dependence: Secondary | ICD-10-CM | POA: Diagnosis not present

## 2018-03-03 DIAGNOSIS — F05 Delirium due to known physiological condition: Secondary | ICD-10-CM | POA: Diagnosis not present

## 2018-03-03 DIAGNOSIS — M810 Age-related osteoporosis without current pathological fracture: Secondary | ICD-10-CM | POA: Diagnosis not present

## 2018-03-03 DIAGNOSIS — Z9013 Acquired absence of bilateral breasts and nipples: Secondary | ICD-10-CM | POA: Diagnosis not present

## 2018-03-03 DIAGNOSIS — M109 Gout, unspecified: Secondary | ICD-10-CM | POA: Diagnosis not present

## 2018-03-08 DIAGNOSIS — I132 Hypertensive heart and chronic kidney disease with heart failure and with stage 5 chronic kidney disease, or end stage renal disease: Secondary | ICD-10-CM | POA: Diagnosis not present

## 2018-03-08 DIAGNOSIS — I2510 Atherosclerotic heart disease of native coronary artery without angina pectoris: Secondary | ICD-10-CM | POA: Diagnosis not present

## 2018-03-08 DIAGNOSIS — N186 End stage renal disease: Secondary | ICD-10-CM | POA: Diagnosis not present

## 2018-03-08 DIAGNOSIS — E1122 Type 2 diabetes mellitus with diabetic chronic kidney disease: Secondary | ICD-10-CM | POA: Diagnosis not present

## 2018-03-08 DIAGNOSIS — I509 Heart failure, unspecified: Secondary | ICD-10-CM | POA: Diagnosis not present

## 2018-03-08 DIAGNOSIS — E1139 Type 2 diabetes mellitus with other diabetic ophthalmic complication: Secondary | ICD-10-CM | POA: Diagnosis not present

## 2018-03-14 DIAGNOSIS — E1122 Type 2 diabetes mellitus with diabetic chronic kidney disease: Secondary | ICD-10-CM | POA: Diagnosis not present

## 2018-03-14 DIAGNOSIS — I132 Hypertensive heart and chronic kidney disease with heart failure and with stage 5 chronic kidney disease, or end stage renal disease: Secondary | ICD-10-CM | POA: Diagnosis not present

## 2018-03-14 DIAGNOSIS — N186 End stage renal disease: Secondary | ICD-10-CM | POA: Diagnosis not present

## 2018-03-14 DIAGNOSIS — I509 Heart failure, unspecified: Secondary | ICD-10-CM | POA: Diagnosis not present

## 2018-03-14 DIAGNOSIS — I2510 Atherosclerotic heart disease of native coronary artery without angina pectoris: Secondary | ICD-10-CM | POA: Diagnosis not present

## 2018-03-14 DIAGNOSIS — E1139 Type 2 diabetes mellitus with other diabetic ophthalmic complication: Secondary | ICD-10-CM | POA: Diagnosis not present

## 2018-03-19 DIAGNOSIS — E1139 Type 2 diabetes mellitus with other diabetic ophthalmic complication: Secondary | ICD-10-CM | POA: Diagnosis not present

## 2018-03-19 DIAGNOSIS — I132 Hypertensive heart and chronic kidney disease with heart failure and with stage 5 chronic kidney disease, or end stage renal disease: Secondary | ICD-10-CM | POA: Diagnosis not present

## 2018-03-19 DIAGNOSIS — I509 Heart failure, unspecified: Secondary | ICD-10-CM | POA: Diagnosis not present

## 2018-03-19 DIAGNOSIS — N186 End stage renal disease: Secondary | ICD-10-CM | POA: Diagnosis not present

## 2018-03-19 DIAGNOSIS — I2510 Atherosclerotic heart disease of native coronary artery without angina pectoris: Secondary | ICD-10-CM | POA: Diagnosis not present

## 2018-03-19 DIAGNOSIS — E1122 Type 2 diabetes mellitus with diabetic chronic kidney disease: Secondary | ICD-10-CM | POA: Diagnosis not present

## 2018-03-28 DIAGNOSIS — I2510 Atherosclerotic heart disease of native coronary artery without angina pectoris: Secondary | ICD-10-CM | POA: Diagnosis not present

## 2018-03-28 DIAGNOSIS — N186 End stage renal disease: Secondary | ICD-10-CM | POA: Diagnosis not present

## 2018-03-28 DIAGNOSIS — E1139 Type 2 diabetes mellitus with other diabetic ophthalmic complication: Secondary | ICD-10-CM | POA: Diagnosis not present

## 2018-03-28 DIAGNOSIS — I509 Heart failure, unspecified: Secondary | ICD-10-CM | POA: Diagnosis not present

## 2018-03-28 DIAGNOSIS — E1122 Type 2 diabetes mellitus with diabetic chronic kidney disease: Secondary | ICD-10-CM | POA: Diagnosis not present

## 2018-03-28 DIAGNOSIS — I132 Hypertensive heart and chronic kidney disease with heart failure and with stage 5 chronic kidney disease, or end stage renal disease: Secondary | ICD-10-CM | POA: Diagnosis not present

## 2018-03-29 DIAGNOSIS — I2510 Atherosclerotic heart disease of native coronary artery without angina pectoris: Secondary | ICD-10-CM | POA: Diagnosis not present

## 2018-03-29 DIAGNOSIS — I132 Hypertensive heart and chronic kidney disease with heart failure and with stage 5 chronic kidney disease, or end stage renal disease: Secondary | ICD-10-CM | POA: Diagnosis not present

## 2018-03-29 DIAGNOSIS — E1139 Type 2 diabetes mellitus with other diabetic ophthalmic complication: Secondary | ICD-10-CM | POA: Diagnosis not present

## 2018-03-29 DIAGNOSIS — I509 Heart failure, unspecified: Secondary | ICD-10-CM | POA: Diagnosis not present

## 2018-03-29 DIAGNOSIS — N186 End stage renal disease: Secondary | ICD-10-CM | POA: Diagnosis not present

## 2018-03-29 DIAGNOSIS — E1122 Type 2 diabetes mellitus with diabetic chronic kidney disease: Secondary | ICD-10-CM | POA: Diagnosis not present

## 2018-07-03 DEATH — deceased
